# Patient Record
Sex: Female | Born: 1940 | Race: White | Hispanic: No | Smoking: Former smoker
Health system: Southern US, Community
[De-identification: ages and names within clinical notes are randomized; demographics above are authoritative.]

## PROBLEM LIST (undated history)

## (undated) DIAGNOSIS — C801 Malignant (primary) neoplasm, unspecified: Secondary | ICD-10-CM

## (undated) DIAGNOSIS — M1990 Unspecified osteoarthritis, unspecified site: Secondary | ICD-10-CM

## (undated) DIAGNOSIS — R112 Nausea with vomiting, unspecified: Secondary | ICD-10-CM

## (undated) DIAGNOSIS — E119 Type 2 diabetes mellitus without complications: Secondary | ICD-10-CM

## (undated) DIAGNOSIS — Z9889 Other specified postprocedural states: Secondary | ICD-10-CM

## (undated) DIAGNOSIS — E785 Hyperlipidemia, unspecified: Secondary | ICD-10-CM

## (undated) HISTORY — PX: FOOT SURGERY: SHX648

## (undated) HISTORY — PX: DILATION AND CURETTAGE OF UTERUS: SHX78

## (undated) HISTORY — PX: FINGER SURGERY: SHX640

## (undated) HISTORY — PX: BREAST SURGERY: SHX581

## (undated) HISTORY — PX: KNEE ARTHROSCOPY: SUR90

## (undated) HISTORY — PX: KNEE ARTHROSCOPY: SHX127

## (undated) HISTORY — PX: APPENDECTOMY: SHX54

## (undated) HISTORY — PX: CATARACT EXTRACTION, BILATERAL: SHX1313

---

## 1999-12-15 ENCOUNTER — Ambulatory Visit (HOSPITAL_BASED_OUTPATIENT_CLINIC_OR_DEPARTMENT_OTHER): Admission: RE | Admit: 1999-12-15 | Discharge: 1999-12-15 | Payer: Self-pay | Admitting: Orthopedic Surgery

## 2000-11-06 ENCOUNTER — Other Ambulatory Visit: Admission: RE | Admit: 2000-11-06 | Discharge: 2000-11-06 | Payer: Self-pay | Admitting: Family Medicine

## 2000-11-30 ENCOUNTER — Ambulatory Visit (HOSPITAL_COMMUNITY): Admission: RE | Admit: 2000-11-30 | Discharge: 2000-11-30 | Payer: Self-pay | Admitting: Gastroenterology

## 2000-12-01 ENCOUNTER — Encounter: Payer: Self-pay | Admitting: Surgery

## 2000-12-01 ENCOUNTER — Ambulatory Visit (HOSPITAL_COMMUNITY): Admission: RE | Admit: 2000-12-01 | Discharge: 2000-12-01 | Payer: Self-pay | Admitting: Surgery

## 2001-01-31 ENCOUNTER — Ambulatory Visit (HOSPITAL_BASED_OUTPATIENT_CLINIC_OR_DEPARTMENT_OTHER): Admission: RE | Admit: 2001-01-31 | Discharge: 2001-02-01 | Payer: Self-pay | Admitting: Plastic Surgery

## 2001-05-14 ENCOUNTER — Encounter: Payer: Self-pay | Admitting: Plastic Surgery

## 2001-05-18 ENCOUNTER — Inpatient Hospital Stay (HOSPITAL_COMMUNITY): Admission: RE | Admit: 2001-05-18 | Discharge: 2001-05-20 | Payer: Self-pay | Admitting: Plastic Surgery

## 2001-07-17 ENCOUNTER — Ambulatory Visit (HOSPITAL_BASED_OUTPATIENT_CLINIC_OR_DEPARTMENT_OTHER): Admission: RE | Admit: 2001-07-17 | Discharge: 2001-07-17 | Payer: Self-pay | Admitting: Plastic Surgery

## 2014-06-25 NOTE — H&P (Signed)
TOTAL KNEE ADMISSION H&P  Patient is being admitted for right total knee arthroplasty.  Subjective:  Chief Complaint:   Right knee primary OA / pain.  HPI: Brenda Pham, 74 y.o. female, has a history of pain and functional disability in the right knee due to arthritis and has failed non-surgical conservative treatments for greater than 12 weeks to include NSAID's and/or analgesics, corticosteriod injections and activity modification.  Onset of symptoms was gradual, starting 3+ years ago with gradually worsening course since that time. The patient noted prior procedures on the knee to include  arthroscopy on the right knee(s).  Patient currently rates pain in the right knee(s) at 10 out of 10 with activity. Patient has night pain, worsening of pain with activity and weight bearing, pain that interferes with activities of daily living, pain with passive range of motion, crepitus and joint swelling.  Patient has evidence of periarticular osteophytes and joint space narrowing by imaging studies.  There is no active infection.  Risks, benefits and expectations were discussed with the patient.  Risks including but not limited to the risk of anesthesia, blood clots, nerve damage, blood vessel damage, failure of the prosthesis, infection and up to and including death.  Patient understand the risks, benefits and expectations and wishes to proceed with surgery.    PCP: Leonard Downing, MD  D/C Plans:      SNF  Post-op Meds:       No Rx given   Tranexamic Acid:      To be given - IV  Decadron:      Is to be given  FYI:     ASA post-op  Norco post-op     Past Medical History  Diagnosis Date  . PONV (postoperative nausea and vomiting)   . Hyperlipidemia   . Diabetes mellitus without complication     recently dx. no oral meds or insulin.  . Arthritis     osteoarthritis -knees, hands  . Cancer     '80-right breast, surgery and radiation.    Past Surgical History  Procedure  Laterality Date  . Cataract extraction, bilateral      last 06-11-14 left  . Appendectomy    . Dilation and curettage of uterus    . Knee arthroscopy Right   . Knee arthroscopy Left     scope with hammer toe, '06- scope for torn cartilage  . Finger surgery Right     right ring finger-excsion cyst and nodules.  . Breast surgery      Rt. breast modified mastectomy/(reconstuction)-last '03 tranflap(tissue from abdomen); and left subcutaneoues mastectomy and implant left breast-last implant '02.  . Foot surgery Left     '10- 2nd toe     No prescriptions prior to admission   Allergies  Allergen Reactions  . Niacin And Related     Extreme flushing, heart racing  . Other     "Clindamycin"-Antibiotic caused thrush.     History  Substance Use Topics  . Smoking status: Former Smoker -- 1.00 packs/day    Types: Cigarettes    Quit date: 07/02/1978  . Smokeless tobacco: Not on file  . Alcohol Use: No       Review of Systems  Constitutional: Negative.   HENT: Negative.   Eyes: Negative.   Respiratory: Negative.   Cardiovascular: Negative.   Gastrointestinal: Negative.   Genitourinary: Negative.   Musculoskeletal: Positive for joint pain.  Skin: Negative.   Neurological: Negative.   Endo/Heme/Allergies: Negative.   Psychiatric/Behavioral:  Negative.     Objective:  Physical Exam  Constitutional: She is oriented to person, place, and time. She appears well-developed and well-nourished.  HENT:  Head: Normocephalic and atraumatic.  Eyes: Pupils are equal, round, and reactive to light.  Neck: Neck supple. No JVD present. No tracheal deviation present. No thyromegaly present.  Cardiovascular: Normal rate, regular rhythm, normal heart sounds and intact distal pulses.   Respiratory: Effort normal and breath sounds normal. No stridor. No respiratory distress. She has no wheezes.  GI: Soft. There is no tenderness. There is no guarding.  Musculoskeletal:       Right knee: She  exhibits decreased range of motion, swelling and bony tenderness. She exhibits no ecchymosis, no deformity, no laceration and no erythema. Tenderness found.  Lymphadenopathy:    She has no cervical adenopathy.  Neurological: She is alert and oriented to person, place, and time.  Skin: Skin is warm and dry.  Psychiatric: She has a normal mood and affect.      Vital signs in last 24 hours: Temp:  [97.4 F (36.3 C)] 97.4 F (36.3 C) (03/02 1103) Pulse Rate:  [86] 86 (03/02 1103) Resp:  [16] 16 (03/02 1103) BP: (127)/(74) 127/74 mmHg (03/02 1103) SpO2:  [98 %] 98 % (03/02 1103) Weight:  [71.782 kg (158 lb 4 oz)] 71.782 kg (158 lb 4 oz) (03/02 1103)    Imaging Review Plain radiographs demonstrate severe degenerative joint disease of the right knee(s). The overall alignment is neutral. The bone quality appears to be good for age and reported activity level.  Assessment/Plan:  End stage arthritis, right knee   The patient history, physical examination, clinical judgment of the provider and imaging studies are consistent with end stage degenerative joint disease of the right knee(s) and total knee arthroplasty is deemed medically necessary. The treatment options including medical management, injection therapy arthroscopy and arthroplasty were discussed at length. The risks and benefits of total knee arthroplasty were presented and reviewed. The risks due to aseptic loosening, infection, stiffness, patella tracking problems, thromboembolic complications and other imponderables were discussed. The patient acknowledged the explanation, agreed to proceed with the plan and consent was signed. Patient is being admitted for inpatient treatment for surgery, pain control, PT, OT, prophylactic antibiotics, VTE prophylaxis, progressive ambulation and ADL's and discharge planning. The patient is planning to be discharged to skilled nursing facility.      West Pugh Tremain Rucinski   PA-C  07/02/2014, 12:52 PM

## 2014-06-27 ENCOUNTER — Inpatient Hospital Stay (HOSPITAL_COMMUNITY): Admission: RE | Admit: 2014-06-27 | Payer: Self-pay | Source: Ambulatory Visit

## 2014-07-01 NOTE — Patient Instructions (Addendum)
49 Brenda Pham  07/01/2014   Your procedure is scheduled on:   3-7--2016 Monday  Enter through [facility]  Entrance and follow signs to [facility]. Arrive at      0630  AM.  Call this number if you have problems the morning of surgery: [phone]  Or Presurgical Testing [phone].   For Living Will and/or Health Care Power Attorney Forms: please provide copy for your medical record,may bring AM of surgery(Forms should be already notarized -we do not provide this service).(07-02-14 Yes/ document scanned  Today into medical record).      Do not eat food/ or drink: After Midnight.      Take these medicines the morning of surgery with A SIP OF WATER: no oral meds- Use/bring eye drops.   Do not wear jewelry, make-up or nail polish.  Do not wear deodorant, lotions, powders, or perfumes.   Do not shave legs and under arms- 48 hours(2 days) prior to first CHG shower.(Shaving face and neck okay.)  Do not bring valuables to the hospital.(Hospital is not responsible for lost valuables).  Contacts, dentures or removable bridgework, body piercing, hair pins may not be worn into surgery.  Leave suitcase in the car. After surgery it may be brought to your room.  For patients admitted to the hospital, checkout time is 11:00 AM the day of discharge.(Restricted visitors-Any Persons displaying flu-like symptoms or illness).    Patients discharged the day of surgery will not be allowed to drive home. Must have responsible person with you x 24 hours once discharged.  Name and phone number of your driver: daughter- Brenda Pham -[phone] cell     Please read over the following fact sheets that you were given:  CHG(Chlorhexidine Gluconate 4% Surgical Soap) use, MRSA Information, Blood Transfusion fact sheet, Incentive Spirometry Instruction.  Remember : Type/Screen "Blue armbands" - may not be removed once applied(would result in being retested AM of surgery, if removed).          [facility] - Preparing for Surgery Before surgery, you can play an important role.  Because skin is not sterile, your skin needs to be as free of germs as possible.  You can reduce the number of germs on your skin by washing with CHG (chlorahexidine gluconate) soap before surgery.  CHG is an antiseptic cleaner which kills germs and bonds with the skin to continue killing germs even after washing. Please DO NOT use if you have an allergy to CHG or antibacterial soaps.  If your skin becomes reddened/irritated stop using the CHG and inform your nurse when you arrive at Short Stay. Do not shave (including legs and underarms) for at least 48 hours prior to the first CHG shower.  You may shave your face/neck. Please follow these instructions carefully:  1.  Shower with CHG Soap the night before surgery and the  morning of Surgery.  2.  If you choose to wash your hair, wash your hair first as usual with your  normal  shampoo.  3.  After you shampoo, rinse your hair and body thoroughly to remove the  shampoo.                           4.  Use CHG as you would any other liquid soap.  You can apply chg directly  to the skin and wash  Gently with a scrungie or clean washcloth.  5.  Apply the CHG Soap to your body ONLY FROM THE NECK DOWN.   Do not use on face/ open                           Wound or open sores. Avoid contact with eyes, ears mouth and genitals (private parts).                       Wash face,  Genitals (private parts) with your normal soap.             6.  Wash thoroughly, paying special attention to the area where your surgery  will be performed.  7.  Thoroughly rinse your body with warm water from the neck down.  8.  DO NOT shower/wash with your normal soap after using and rinsing off  the CHG Soap.                9.  Pat yourself dry with a clean towel.            10.  Wear clean pajamas.            11.  Place clean sheets on your bed the night of your first shower and  do not  sleep with pets. Day of Surgery : Do not apply any lotions/deodorants the morning of surgery.  Please wear clean clothes to the hospital/surgery center.  FAILURE TO FOLLOW THESE INSTRUCTIONS MAY RESULT IN THE CANCELLATION OF YOUR SURGERY PATIENT SIGNATURE_________________________________  NURSE SIGNATURE__________________________________  ________________________________________________________________________   Brenda Pham  An incentive spirometer is a tool that can help keep your lungs clear and active. This tool measures how well you are filling your lungs with each breath. Taking long deep breaths may help reverse or decrease the chance of developing breathing (pulmonary) problems (especially infection) following:  A long period of time when you are unable to move or be active. BEFORE THE PROCEDURE   If the spirometer includes an indicator to show your best effort, your nurse or respiratory therapist will set it to a desired goal.  If possible, sit up straight or lean slightly forward. Try not to slouch.  Hold the incentive spirometer in an upright position. INSTRUCTIONS FOR USE   Sit on the edge of your bed if possible, or sit up as far as you can in bed or on a chair.  Hold the incentive spirometer in an upright position.  Breathe out normally.  Place the mouthpiece in your mouth and seal your lips tightly around it.  Breathe in slowly and as deeply as possible, raising the piston or the ball toward the top of the column.  Hold your breath for 3-5 seconds or for as long as possible. Allow the piston or ball to fall to the bottom of the column.  Remove the mouthpiece from your mouth and breathe out normally.  Rest for a few seconds and repeat Steps 1 through 7 at least 10 times every 1-2 hours when you are awake. Take your time and take a few normal breaths between deep breaths.  The spirometer may include an indicator to show your best effort. Use the  indicator as a goal to work toward during each repetition.  After each set of 10 deep breaths, practice coughing to be sure your lungs are clear. If you have an incision (the cut made at the time of  surgery), support your incision when coughing by placing a pillow or rolled up towels firmly against it. Once you are able to get out of bed, walk around indoors and cough well. You may stop using the incentive spirometer when instructed by your caregiver.  RISKS AND COMPLICATIONS  Take your time so you do not get dizzy or light-headed.  If you are in pain, you may need to take or ask for pain medication before doing incentive spirometry. It is harder to take a deep breath if you are having pain. AFTER USE  Rest and breathe slowly and easily.  It can be helpful to keep track of a log of your progress. Your caregiver can provide you with a simple table to help with this. If you are using the spirometer at home, follow these instructions: [place] IF:   You are having difficultly using the spirometer.  You have trouble using the spirometer as often as instructed.  Your pain medication is not giving enough relief while using the spirometer.  You develop fever of 100.5 F (38.1 C) or higher. SEEK IMMEDIATE MEDICAL CARE IF:   You cough up bloody sputum that had not been present before.  You develop fever of 102 F (38.9 C) or greater.  You develop worsening pain at or near the incision site. MAKE SURE YOU:   Understand these instructions.  Will watch your condition.  Will get help right away if you are not doing well or get worse. Document Released: 08/29/2006 Document Revised: 07/11/2011 Document Reviewed: 10/30/2006 Exit[employer] Patient Information 2014 [employer], [place].   ________________________________________________________________________  WHAT IS A BLOOD TRANSFUSION? Blood Transfusion Information  A transfusion is the replacement of blood or some of its parts. Blood  is made up of multiple cells which provide different functions.  Red blood cells carry oxygen and are used for blood loss replacement.  White blood cells fight against infection.  Platelets control bleeding.  Plasma helps clot blood.  Other blood products are available for specialized needs, such as hemophilia or other clotting disorders. BEFORE THE TRANSFUSION  Who gives blood for transfusions?   Healthy volunteers who are fully evaluated to make sure their blood is safe. This is blood bank blood. Transfusion therapy is the safest it has ever been in the practice of medicine. Before blood is taken from a donor, a complete history is taken to make sure that person has no history of diseases nor engages in risky social behavior (examples are intravenous drug use or sexual activity with multiple partners). The donor's travel history is screened to minimize risk of transmitting infections, such as malaria. The donated blood is tested for signs of infectious diseases, such as HIV and hepatitis. The blood is then tested to be sure it is compatible with you in order to minimize the chance of a transfusion reaction. If you or a relative donates blood, this is often done in anticipation of surgery and is not appropriate for emergency situations. It takes many days to process the donated blood. RISKS AND COMPLICATIONS Although transfusion therapy is very safe and saves many lives, the main dangers of transfusion include:   Getting an infectious disease.  Developing a transfusion reaction. This is an allergic reaction to something in the blood you were given. Every precaution is taken to prevent this. The decision to have a blood transfusion has been considered carefully by your caregiver before blood is given. Blood is not given unless the benefits outweigh the risks. AFTER THE TRANSFUSION  Right after receiving a blood transfusion, you will usually feel much better and more energetic. This is  especially true if your red blood cells have gotten low (anemic). The transfusion raises the level of the red blood cells which carry oxygen, and this usually causes an energy increase.  The nurse administering the transfusion will monitor you carefully for complications. HOME CARE INSTRUCTIONS  No special instructions are needed after a transfusion. You may find your energy is better. Speak with your caregiver about any limitations on activity for underlying diseases you may have. SEEK MEDICAL CARE IF:   Your condition is not improving after your transfusion.  You develop redness or irritation at the intravenous (IV) site. SEEK IMMEDIATE MEDICAL CARE IF:  Any of the following symptoms occur over the next 12 hours:  Shaking chills.  You have a temperature by mouth above 102 F (38.9 C), not controlled by medicine.  Chest, back, or muscle pain.  People around you feel you are not acting correctly or are confused.  Shortness of breath or difficulty breathing.  Dizziness and fainting.  You get a rash or develop hives.  You have a decrease in urine output.  Your urine turns a dark color or changes to pink, red, or brown. Any of the following symptoms occur over the next 10 days:  You have a temperature by mouth above 102 F (38.9 C), not controlled by medicine.  Shortness of breath.  Weakness after normal activity.  The white part of the eye turns yellow (jaundice).  You have a decrease in the amount of urine or are urinating less often.  Your urine turns a dark color or changes to pink, red, or brown. Document Released: 04/15/2000 Document Revised: 07/11/2011 Document Reviewed: 12/03/2007 [facility] Patient Information 2014 [place], [place].  _______________________________________________________________________

## 2014-07-02 ENCOUNTER — Encounter (HOSPITAL_COMMUNITY): Payer: Self-pay

## 2014-07-02 ENCOUNTER — Encounter (HOSPITAL_COMMUNITY)
Admission: RE | Admit: 2014-07-02 | Discharge: 2014-07-02 | Disposition: A | Discharge status: Home / Self Care | Payer: Medicare Other | Source: Ambulatory Visit | Attending: Orthopedic Surgery | Admitting: Orthopedic Surgery

## 2014-07-02 DIAGNOSIS — Z01818 Encounter for other preprocedural examination: Secondary | ICD-10-CM | POA: Insufficient documentation

## 2014-07-02 DIAGNOSIS — Z01812 Encounter for preprocedural laboratory examination: Secondary | ICD-10-CM | POA: Diagnosis not present

## 2014-07-02 DIAGNOSIS — Z87891 Personal history of nicotine dependence: Secondary | ICD-10-CM | POA: Insufficient documentation

## 2014-07-02 DIAGNOSIS — M179 Osteoarthritis of knee, unspecified: Secondary | ICD-10-CM | POA: Diagnosis not present

## 2014-07-02 DIAGNOSIS — Z0183 Encounter for blood typing: Secondary | ICD-10-CM | POA: Insufficient documentation

## 2014-07-02 DIAGNOSIS — E119 Type 2 diabetes mellitus without complications: Secondary | ICD-10-CM | POA: Diagnosis not present

## 2014-07-02 DIAGNOSIS — E785 Hyperlipidemia, unspecified: Secondary | ICD-10-CM | POA: Diagnosis not present

## 2014-07-02 HISTORY — DX: Hyperlipidemia, unspecified: E78.5

## 2014-07-02 HISTORY — DX: Type 2 diabetes mellitus without complications: E11.9

## 2014-07-02 HISTORY — DX: Unspecified osteoarthritis, unspecified site: M19.90

## 2014-07-02 HISTORY — DX: Malignant (primary) neoplasm, unspecified: C80.1

## 2014-07-02 HISTORY — DX: Other specified postprocedural states: Z98.890

## 2014-07-02 HISTORY — DX: Other specified postprocedural states: R11.2

## 2014-07-02 LAB — CBC
HCT: 41.2 % (ref 36.0–46.0)
Hemoglobin: 13.6 g/dL (ref 12.0–15.0)
MCH: 28.5 pg (ref 26.0–34.0)
MCHC: 33 g/dL (ref 30.0–36.0)
MCV: 86.2 fL (ref 78.0–100.0)
Platelets: 169 10*3/uL (ref 150–400)
RBC: 4.78 MIL/uL (ref 3.87–5.11)
RDW: 13.4 % (ref 11.5–15.5)
WBC: 3.8 10*3/uL — ABNORMAL LOW (ref 4.0–10.5)

## 2014-07-02 LAB — URINALYSIS, ROUTINE W REFLEX MICROSCOPIC
Bilirubin Urine: NEGATIVE
Glucose, UA: NEGATIVE mg/dL
Hgb urine dipstick: NEGATIVE
Ketones, ur: NEGATIVE mg/dL
Leukocytes, UA: NEGATIVE
Nitrite: NEGATIVE
Protein, ur: NEGATIVE mg/dL
Specific Gravity, Urine: 1.02 (ref 1.005–1.030)
Urobilinogen, UA: 0.2 mg/dL (ref 0.0–1.0)
pH: 5 (ref 5.0–8.0)

## 2014-07-02 LAB — BASIC METABOLIC PANEL
Anion gap: 8 (ref 5–15)
BUN: 29 mg/dL — ABNORMAL HIGH (ref 6–23)
CO2: 26 mmol/L (ref 19–32)
Calcium: 10.5 mg/dL (ref 8.4–10.5)
Chloride: 106 mmol/L (ref 96–112)
Creatinine, Ser: 0.87 mg/dL (ref 0.50–1.10)
GFR calc Af Amer: 74 mL/min — ABNORMAL LOW (ref >90)
GFR calc non Af Amer: 64 mL/min — ABNORMAL LOW (ref >90)
Glucose, Bld: 130 mg/dL — ABNORMAL HIGH (ref 70–99)
Potassium: 4.3 mmol/L (ref 3.5–5.1)
Sodium: 140 mmol/L (ref 135–145)

## 2014-07-02 LAB — APTT: aPTT: 31 seconds (ref 24–37)

## 2014-07-02 LAB — PROTIME-INR
INR: 1.15 (ref 0.00–1.49)
Prothrombin Time: 14.8 seconds (ref 11.6–15.2)

## 2014-07-02 LAB — ABO/RH: ABO/RH(D): O POS

## 2014-07-02 LAB — SURGICAL PCR SCREEN
MRSA, PCR: NEGATIVE
Staphylococcus aureus: POSITIVE — AB

## 2014-07-02 NOTE — Pre-Procedure Instructions (Signed)
07-02-14 EKG/ CXR 06-09-14 reports with chart. Clearance note (Dr. Welton Flakes) 05-09-14 with chart.

## 2014-07-02 NOTE — Progress Notes (Signed)
07-02-14 1415 Pt made aware of Positive Staph aureus PCR- Rx for Mupirocin ointment called to [place]. Faxed note sent to Dr. Alvan Dame [phone]. Please note labs viewable in Epic-note BMP

## 2014-07-07 ENCOUNTER — Inpatient Hospital Stay (HOSPITAL_COMMUNITY): Payer: Medicare Other | Admitting: Registered Nurse

## 2014-07-07 ENCOUNTER — Encounter (HOSPITAL_COMMUNITY): Payer: Self-pay | Admitting: *Deleted

## 2014-07-07 ENCOUNTER — Encounter (HOSPITAL_COMMUNITY)
Admission: RE | Disposition: A | Discharge status: Home / Self Care | Payer: Self-pay | Source: Ambulatory Visit | Attending: Orthopedic Surgery

## 2014-07-07 ENCOUNTER — Inpatient Hospital Stay (HOSPITAL_COMMUNITY)
Admission: RE | Admit: 2014-07-07 | Discharge: 2014-07-09 | DRG: 470 | Disposition: A | Discharge status: Home / Self Care | Payer: Medicare Other | Source: Ambulatory Visit | Attending: Orthopedic Surgery | Admitting: Orthopedic Surgery

## 2014-07-07 DIAGNOSIS — Z9841 Cataract extraction status, right eye: Secondary | ICD-10-CM | POA: Diagnosis not present

## 2014-07-07 DIAGNOSIS — Z888 Allergy status to other drugs, medicaments and biological substances status: Secondary | ICD-10-CM

## 2014-07-07 DIAGNOSIS — Z6825 Body mass index (BMI) 25.0-25.9, adult: Secondary | ICD-10-CM

## 2014-07-07 DIAGNOSIS — M1711 Unilateral primary osteoarthritis, right knee: Secondary | ICD-10-CM | POA: Diagnosis present

## 2014-07-07 DIAGNOSIS — Z87891 Personal history of nicotine dependence: Secondary | ICD-10-CM

## 2014-07-07 DIAGNOSIS — E119 Type 2 diabetes mellitus without complications: Secondary | ICD-10-CM | POA: Diagnosis present

## 2014-07-07 DIAGNOSIS — E663 Overweight: Secondary | ICD-10-CM | POA: Diagnosis present

## 2014-07-07 DIAGNOSIS — Z881 Allergy status to other antibiotic agents status: Secondary | ICD-10-CM | POA: Diagnosis not present

## 2014-07-07 DIAGNOSIS — Z9011 Acquired absence of right breast and nipple: Secondary | ICD-10-CM | POA: Diagnosis present

## 2014-07-07 DIAGNOSIS — M65861 Other synovitis and tenosynovitis, right lower leg: Secondary | ICD-10-CM | POA: Diagnosis present

## 2014-07-07 DIAGNOSIS — Z853 Personal history of malignant neoplasm of breast: Secondary | ICD-10-CM | POA: Diagnosis not present

## 2014-07-07 DIAGNOSIS — M25561 Pain in right knee: Secondary | ICD-10-CM | POA: Diagnosis present

## 2014-07-07 DIAGNOSIS — E785 Hyperlipidemia, unspecified: Secondary | ICD-10-CM | POA: Diagnosis present

## 2014-07-07 DIAGNOSIS — Z9842 Cataract extraction status, left eye: Secondary | ICD-10-CM

## 2014-07-07 DIAGNOSIS — Z96651 Presence of right artificial knee joint: Secondary | ICD-10-CM

## 2014-07-07 DIAGNOSIS — Z96659 Presence of unspecified artificial knee joint: Secondary | ICD-10-CM

## 2014-07-07 HISTORY — PX: TOTAL KNEE ARTHROPLASTY: SHX125

## 2014-07-07 LAB — TYPE AND SCREEN
ABO/RH(D): O POS
Antibody Screen: NEGATIVE

## 2014-07-07 LAB — GLUCOSE, CAPILLARY: Glucose-Capillary: 99 mg/dL (ref 70–99)

## 2014-07-07 SURGERY — ARTHROPLASTY, KNEE, TOTAL
Anesthesia: Monitor Anesthesia Care | Site: Knee | Laterality: Right

## 2014-07-07 MED ORDER — BUPIVACAINE-EPINEPHRINE (PF) 0.25% -1:200000 IJ SOLN
INTRAMUSCULAR | Status: AC
  Filled 2014-07-07: qty 30

## 2014-07-07 MED ORDER — KETOROLAC TROMETHAMINE 0.5 % OP SOLN
1.0000 [drp] | Freq: Every day | OPHTHALMIC | Status: DC
  Filled 2014-07-07: qty 3

## 2014-07-07 MED ORDER — DEXAMETHASONE SODIUM PHOSPHATE 10 MG/ML IJ SOLN
INTRAMUSCULAR | Status: AC
  Filled 2014-07-07: qty 1

## 2014-07-07 MED ORDER — HYDROMORPHONE HCL 1 MG/ML IJ SOLN
0.5000 mg | INTRAMUSCULAR | Status: DC | PRN
  Administered 2014-07-08: 0.5 mg via INTRAVENOUS
  Filled 2014-07-07: qty 1

## 2014-07-07 MED ORDER — ARTIFICIAL TEARS OP OINT
TOPICAL_OINTMENT | Freq: Every day | OPHTHALMIC | Status: DC
  Administered 2014-07-08: 22:00:00 via OPHTHALMIC
  Filled 2014-07-07: qty 3.5

## 2014-07-07 MED ORDER — ALUM & MAG HYDROXIDE-SIMETH 200-200-20 MG/5ML PO SUSP: 30.0000 mL | ORAL | Status: DC | PRN

## 2014-07-07 MED ORDER — DIPHENHYDRAMINE HCL 25 MG PO CAPS: 25.0000 mg | ORAL_CAPSULE | Freq: Four times a day (QID) | ORAL | Status: DC | PRN

## 2014-07-07 MED ORDER — POLYETHYL GLYCOL-PROPYL GLYCOL 0.4-0.3 % OP GEL: 1.0000 "application " | Freq: Every day | OPHTHALMIC | Status: DC

## 2014-07-07 MED ORDER — ASPIRIN EC 325 MG PO TBEC
325.0000 mg | DELAYED_RELEASE_TABLET | Freq: Two times a day (BID) | ORAL | Status: DC
  Administered 2014-07-08 – 2014-07-09 (×3): 325 mg via ORAL
  Filled 2014-07-07 (×5): qty 1

## 2014-07-07 MED ORDER — PROMETHAZINE HCL 25 MG/ML IJ SOLN: 6.2500 mg | INTRAMUSCULAR | Status: DC | PRN

## 2014-07-07 MED ORDER — PHENOL 1.4 % MT LIQD: 1.0000 | OROMUCOSAL | Status: DC | PRN

## 2014-07-07 MED ORDER — MAGNESIUM CITRATE PO SOLN: 1.0000 | Freq: Once | ORAL | Status: AC | PRN

## 2014-07-07 MED ORDER — PROPOFOL 10 MG/ML IV BOLUS
INTRAVENOUS | Status: AC
  Filled 2014-07-07: qty 20

## 2014-07-07 MED ORDER — METOCLOPRAMIDE HCL 5 MG/ML IJ SOLN
5.0000 mg | Freq: Three times a day (TID) | INTRAMUSCULAR | Status: DC | PRN
  Filled 2014-07-07: qty 2

## 2014-07-07 MED ORDER — SODIUM CHLORIDE 0.9 % IJ SOLN
INTRAMUSCULAR | Status: AC
  Filled 2014-07-07: qty 50

## 2014-07-07 MED ORDER — METHOCARBAMOL 500 MG PO TABS
500.0000 mg | ORAL_TABLET | Freq: Four times a day (QID) | ORAL | Status: DC | PRN
  Administered 2014-07-08 – 2014-07-09 (×2): 500 mg via ORAL
  Filled 2014-07-07 (×2): qty 1

## 2014-07-07 MED ORDER — SODIUM CHLORIDE 0.9 % IR SOLN
Status: DC | PRN
  Administered 2014-07-07: 1000 mL

## 2014-07-07 MED ORDER — CEFAZOLIN SODIUM-DEXTROSE 2-3 GM-% IV SOLR
2.0000 g | INTRAVENOUS | Status: AC
  Administered 2014-07-07: 2 g via INTRAVENOUS

## 2014-07-07 MED ORDER — BUPIVACAINE IN DEXTROSE 0.75-8.25 % IT SOLN
INTRATHECAL | Status: DC | PRN
  Administered 2014-07-07: 1.8 mL via INTRATHECAL

## 2014-07-07 MED ORDER — CHLORHEXIDINE GLUCONATE 4 % EX LIQD: 60.0000 mL | Freq: Once | CUTANEOUS | Status: DC

## 2014-07-07 MED ORDER — ATORVASTATIN CALCIUM 20 MG PO TABS
20.0000 mg | ORAL_TABLET | Freq: Every day | ORAL | Status: DC
  Administered 2014-07-07 – 2014-07-08 (×2): 20 mg via ORAL
  Filled 2014-07-07 (×3): qty 1

## 2014-07-07 MED ORDER — METOCLOPRAMIDE HCL 10 MG PO TABS: 5.0000 mg | ORAL_TABLET | Freq: Three times a day (TID) | ORAL | Status: DC | PRN

## 2014-07-07 MED ORDER — FENTANYL CITRATE 0.05 MG/ML IJ SOLN
INTRAMUSCULAR | Status: AC
  Filled 2014-07-07: qty 2

## 2014-07-07 MED ORDER — ONDANSETRON HCL 4 MG/2ML IJ SOLN
INTRAMUSCULAR | Status: DC | PRN
  Administered 2014-07-07: 4 mg via INTRAVENOUS

## 2014-07-07 MED ORDER — ONDANSETRON HCL 4 MG/2ML IJ SOLN
INTRAMUSCULAR | Status: AC
  Filled 2014-07-07: qty 2

## 2014-07-07 MED ORDER — ONDANSETRON HCL 4 MG PO TABS: 4.0000 mg | ORAL_TABLET | Freq: Four times a day (QID) | ORAL | Status: DC | PRN

## 2014-07-07 MED ORDER — CEFAZOLIN SODIUM-DEXTROSE 2-3 GM-% IV SOLR
INTRAVENOUS | Status: AC
  Filled 2014-07-07: qty 50

## 2014-07-07 MED ORDER — BISACODYL 10 MG RE SUPP: 10.0000 mg | Freq: Every day | RECTAL | Status: DC | PRN

## 2014-07-07 MED ORDER — OXYCODONE HCL 5 MG PO TABS: 5.0000 mg | ORAL_TABLET | Freq: Once | ORAL | Status: DC | PRN

## 2014-07-07 MED ORDER — DEXAMETHASONE SODIUM PHOSPHATE 10 MG/ML IJ SOLN: 10.0000 mg | Freq: Once | INTRAMUSCULAR | Status: DC

## 2014-07-07 MED ORDER — SODIUM CHLORIDE 0.9 % IJ SOLN
INTRAMUSCULAR | Status: DC | PRN
  Administered 2014-07-07: 30 mL via INTRAVENOUS

## 2014-07-07 MED ORDER — LIDOCAINE HCL (CARDIAC) 20 MG/ML IV SOLN
INTRAVENOUS | Status: AC
  Filled 2014-07-07: qty 5

## 2014-07-07 MED ORDER — PROPOFOL INFUSION 10 MG/ML OPTIME
INTRAVENOUS | Status: DC | PRN
  Administered 2014-07-07: 50 ug/kg/min via INTRAVENOUS

## 2014-07-07 MED ORDER — MIDAZOLAM HCL 5 MG/5ML IJ SOLN
INTRAMUSCULAR | Status: DC | PRN
  Administered 2014-07-07 (×2): 1 mg via INTRAVENOUS

## 2014-07-07 MED ORDER — KETOROLAC TROMETHAMINE 30 MG/ML IJ SOLN
INTRAMUSCULAR | Status: DC | PRN
  Administered 2014-07-07: 30 mg via INTRAVENOUS

## 2014-07-07 MED ORDER — BUPIVACAINE-EPINEPHRINE (PF) 0.25% -1:200000 IJ SOLN
INTRAMUSCULAR | Status: DC | PRN
  Administered 2014-07-07: 30 mL

## 2014-07-07 MED ORDER — KETOROLAC TROMETHAMINE 30 MG/ML IJ SOLN
INTRAMUSCULAR | Status: AC
  Filled 2014-07-07: qty 1

## 2014-07-07 MED ORDER — TRANEXAMIC ACID 100 MG/ML IV SOLN
1000.0000 mg | Freq: Once | INTRAVENOUS | Status: AC
  Administered 2014-07-07: 1000 mg via INTRAVENOUS
  Filled 2014-07-07: qty 10

## 2014-07-07 MED ORDER — POLYETHYLENE GLYCOL 3350 17 G PO PACK
17.0000 g | PACK | Freq: Two times a day (BID) | ORAL | Status: DC
  Administered 2014-07-07 – 2014-07-08 (×3): 17 g via ORAL

## 2014-07-07 MED ORDER — DEXTROSE 5 % IV SOLN
500.0000 mg | Freq: Four times a day (QID) | INTRAVENOUS | Status: DC | PRN
  Administered 2014-07-07: 500 mg via INTRAVENOUS
  Filled 2014-07-07 (×2): qty 5

## 2014-07-07 MED ORDER — LACTATED RINGERS IV SOLN
INTRAVENOUS | Status: DC
  Administered 2014-07-07: 1000 mL via INTRAVENOUS

## 2014-07-07 MED ORDER — FERROUS SULFATE 325 (65 FE) MG PO TABS
325.0000 mg | ORAL_TABLET | Freq: Three times a day (TID) | ORAL | Status: DC
  Administered 2014-07-08 (×3): 325 mg via ORAL
  Filled 2014-07-07 (×8): qty 1

## 2014-07-07 MED ORDER — HYDROMORPHONE HCL 1 MG/ML IJ SOLN
INTRAMUSCULAR | Status: AC
  Filled 2014-07-07: qty 1

## 2014-07-07 MED ORDER — CEFAZOLIN SODIUM-DEXTROSE 2-3 GM-% IV SOLR
2.0000 g | Freq: Four times a day (QID) | INTRAVENOUS | Status: AC
  Administered 2014-07-07 (×2): 2 g via INTRAVENOUS
  Filled 2014-07-07 (×2): qty 50

## 2014-07-07 MED ORDER — FENTANYL CITRATE 0.05 MG/ML IJ SOLN
INTRAMUSCULAR | Status: DC | PRN
  Administered 2014-07-07: 25 ug via INTRAVENOUS

## 2014-07-07 MED ORDER — ONDANSETRON HCL 4 MG/2ML IJ SOLN
4.0000 mg | Freq: Four times a day (QID) | INTRAMUSCULAR | Status: DC | PRN
  Administered 2014-07-09: 4 mg via INTRAVENOUS
  Filled 2014-07-07 (×2): qty 2

## 2014-07-07 MED ORDER — CELECOXIB 200 MG PO CAPS
200.0000 mg | ORAL_CAPSULE | Freq: Two times a day (BID) | ORAL | Status: DC
  Administered 2014-07-07 – 2014-07-08 (×3): 200 mg via ORAL
  Filled 2014-07-07 (×5): qty 1

## 2014-07-07 MED ORDER — DEXAMETHASONE SODIUM PHOSPHATE 10 MG/ML IJ SOLN
10.0000 mg | Freq: Once | INTRAMUSCULAR | Status: AC
  Administered 2014-07-07: 10 mg via INTRAVENOUS

## 2014-07-07 MED ORDER — HYDROMORPHONE HCL 1 MG/ML IJ SOLN
0.2500 mg | INTRAMUSCULAR | Status: DC | PRN
  Administered 2014-07-07: 0.5 mg via INTRAVENOUS

## 2014-07-07 MED ORDER — DOCUSATE SODIUM 100 MG PO CAPS
100.0000 mg | ORAL_CAPSULE | Freq: Two times a day (BID) | ORAL | Status: DC
  Administered 2014-07-07 – 2014-07-08 (×3): 100 mg via ORAL

## 2014-07-07 MED ORDER — OFLOXACIN 0.3 % OP SOLN
1.0000 [drp] | Freq: Two times a day (BID) | OPHTHALMIC | Status: DC
  Filled 2014-07-07: qty 5

## 2014-07-07 MED ORDER — HYDROCODONE-ACETAMINOPHEN 7.5-325 MG PO TABS
1.0000 | ORAL_TABLET | ORAL | Status: DC
  Administered 2014-07-07: 1 via ORAL
  Administered 2014-07-07: 2 via ORAL
  Administered 2014-07-08 (×2): 1 via ORAL
  Administered 2014-07-08: 2 via ORAL
  Administered 2014-07-08 – 2014-07-09 (×2): 1 via ORAL
  Administered 2014-07-09 (×2): 2 via ORAL
  Filled 2014-07-07: qty 1
  Filled 2014-07-07 (×2): qty 2
  Filled 2014-07-07 (×3): qty 1
  Filled 2014-07-07 (×3): qty 2
  Filled 2014-07-07 (×2): qty 1
  Filled 2014-07-07: qty 2

## 2014-07-07 MED ORDER — OXYCODONE HCL 5 MG/5ML PO SOLN
5.0000 mg | Freq: Once | ORAL | Status: DC | PRN
  Filled 2014-07-07: qty 5

## 2014-07-07 MED ORDER — SODIUM CHLORIDE 0.9 % IV SOLN
INTRAVENOUS | Status: DC
  Administered 2014-07-07: 16:00:00 via INTRAVENOUS
  Filled 2014-07-07 (×11): qty 1000

## 2014-07-07 MED ORDER — 0.9 % SODIUM CHLORIDE (POUR BTL) OPTIME
TOPICAL | Status: DC | PRN
  Administered 2014-07-07: 1000 mL

## 2014-07-07 MED ORDER — MIDAZOLAM HCL 2 MG/2ML IJ SOLN
INTRAMUSCULAR | Status: AC
  Filled 2014-07-07: qty 2

## 2014-07-07 MED ORDER — MENTHOL 3 MG MT LOZG: 1.0000 | LOZENGE | OROMUCOSAL | Status: DC | PRN

## 2014-07-07 SURGICAL SUPPLY — 59 items
ADH SKN CLS APL DERMABOND .7 (GAUZE/BANDAGES/DRESSINGS) ×1
BAG DECANTER FOR FLEXI CONT (MISCELLANEOUS) IMPLANT
BAG SPEC THK2 15X12 ZIP CLS (MISCELLANEOUS)
BAG ZIPLOCK 12X15 (MISCELLANEOUS) IMPLANT
BANDAGE ELASTIC 6 VELCRO ST LF (GAUZE/BANDAGES/DRESSINGS) ×2 IMPLANT
BANDAGE ESMARK 6X9 LF (GAUZE/BANDAGES/DRESSINGS) ×1 IMPLANT
BLADE SAW SGTL 13.0X1.19X90.0M (BLADE) ×2 IMPLANT
BNDG CMPR 9X6 STRL LF SNTH (GAUZE/BANDAGES/DRESSINGS) ×1
BNDG ESMARK 6X9 LF (GAUZE/BANDAGES/DRESSINGS) ×2
BONE CEMENT GENTAMICIN (Cement) ×4 IMPLANT
BOWL SMART MIX CTS (DISPOSABLE) ×2 IMPLANT
CAP KNEE TOTAL 3 SIGMA ×1 IMPLANT
CEMENT BONE GENTAMICIN 40 (Cement) IMPLANT
CUFF TOURN SGL QUICK 34 (TOURNIQUET CUFF) ×2
CUFF TRNQT CYL 34X4X40X1 (TOURNIQUET CUFF) ×1 IMPLANT
DECANTER SPIKE VIAL GLASS SM (MISCELLANEOUS) ×2 IMPLANT
DERMABOND ADVANCED (GAUZE/BANDAGES/DRESSINGS) ×1
DERMABOND ADVANCED .7 DNX12 (GAUZE/BANDAGES/DRESSINGS) ×1 IMPLANT
DRAPE EXTREMITY T 121X128X90 (DRAPE) ×2 IMPLANT
DRAPE POUCH INSTRU U-SHP 10X18 (DRAPES) ×2 IMPLANT
DRAPE U-SHAPE 47X51 STRL (DRAPES) ×2 IMPLANT
DRSG AQUACEL AG ADV 3.5X10 (GAUZE/BANDAGES/DRESSINGS) ×2 IMPLANT
DURAPREP 26ML APPLICATOR (WOUND CARE) ×4 IMPLANT
ELECT REM PT RETURN 9FT ADLT (ELECTROSURGICAL) ×2
ELECTRODE REM PT RTRN 9FT ADLT (ELECTROSURGICAL) ×1 IMPLANT
FACESHIELD WRAPAROUND (MASK) ×10 IMPLANT
FACESHIELD WRAPAROUND OR TEAM (MASK) ×5 IMPLANT
GLOVE BIOGEL PI IND STRL 7.5 (GLOVE) ×1 IMPLANT
GLOVE BIOGEL PI IND STRL 8.5 (GLOVE) ×1 IMPLANT
GLOVE BIOGEL PI INDICATOR 7.5 (GLOVE) ×1
GLOVE BIOGEL PI INDICATOR 8.5 (GLOVE) ×1
GLOVE ECLIPSE 8.0 STRL XLNG CF (GLOVE) ×2 IMPLANT
GLOVE ORTHO TXT STRL SZ7.5 (GLOVE) ×4 IMPLANT
GOWN SPEC L3 XXLG W/TWL (GOWN DISPOSABLE) ×2 IMPLANT
GOWN STRL REUS W/TWL LRG LVL3 (GOWN DISPOSABLE) ×2 IMPLANT
HANDPIECE INTERPULSE COAX TIP (DISPOSABLE) ×2
KIT BASIN OR (CUSTOM PROCEDURE TRAY) ×2 IMPLANT
LIQUID BAND (GAUZE/BANDAGES/DRESSINGS) ×2 IMPLANT
MANIFOLD NEPTUNE II (INSTRUMENTS) ×2 IMPLANT
NDL SAFETY ECLIPSE 18X1.5 (NEEDLE) ×1 IMPLANT
NEEDLE HYPO 18GX1.5 SHARP (NEEDLE) ×2
PACK TOTAL JOINT (CUSTOM PROCEDURE TRAY) ×2 IMPLANT
PEN SKIN MARKING BROAD (MISCELLANEOUS) ×2 IMPLANT
POSITIONER SURGICAL ARM (MISCELLANEOUS) ×2 IMPLANT
SET HNDPC FAN SPRY TIP SCT (DISPOSABLE) ×1 IMPLANT
SET PAD KNEE POSITIONER (MISCELLANEOUS) ×2 IMPLANT
SUCTION FRAZIER 12FR DISP (SUCTIONS) ×2 IMPLANT
SUT MNCRL AB 4-0 PS2 18 (SUTURE) ×2 IMPLANT
SUT VIC AB 1 CT1 36 (SUTURE) ×2 IMPLANT
SUT VIC AB 2-0 CT1 27 (SUTURE) ×6
SUT VIC AB 2-0 CT1 TAPERPNT 27 (SUTURE) ×3 IMPLANT
SUT VLOC 180 0 24IN GS25 (SUTURE) ×2 IMPLANT
SYR 50ML LL SCALE MARK (SYRINGE) ×2 IMPLANT
TOWEL OR 17X26 10 PK STRL BLUE (TOWEL DISPOSABLE) ×2 IMPLANT
TOWEL OR NON WOVEN STRL DISP B (DISPOSABLE) IMPLANT
TRAY FOLEY CATH 14FRSI W/METER (CATHETERS) ×2 IMPLANT
WATER STERILE IRR 1500ML POUR (IV SOLUTION) ×2 IMPLANT
WRAP KNEE MAXI GEL POST OP (GAUZE/BANDAGES/DRESSINGS) ×2 IMPLANT
YANKAUER SUCT BULB TIP 10FT TU (MISCELLANEOUS) ×2 IMPLANT

## 2014-07-07 NOTE — Interval H&P Note (Signed)
History and Physical Interval Note:  07/07/2014 6:44 AM  Brenda Pham  has presented today for surgery, with the diagnosis of RIGHT KNEE OA  The various methods of treatment have been discussed with the patient and family. After consideration of risks, benefits and other options for treatment, the patient has consented to  Procedure(s): RIGHTYTOTAL KNEE ARTHROPLASTY (Right) as a surgical intervention .  The patient's history has been reviewed, patient examined, no change in status, stable for surgery.  I have reviewed the patient's chart and labs.  Questions were answered to the patient's satisfaction.     [name]

## 2014-07-07 NOTE — Anesthesia Preprocedure Evaluation (Addendum)
Anesthesia Evaluation  Patient identified by MRN, date of birth, ID band Patient awake    Reviewed: Allergy & Precautions, NPO status , Patient's Chart, lab work & pertinent test results  History of Anesthesia Complications (+) PONV  Airway Mallampati: III  TM Distance: >3 FB Neck ROM: Full    Dental  (+) Teeth Intact   Pulmonary former smoker,  breath sounds clear to auscultation        Cardiovascular negative cardio ROS  Rhythm:Regular Rate:Normal     Neuro/Psych negative neurological ROS     GI/Hepatic negative GI ROS, Neg liver ROS,   Endo/Other  diabetes, Well Controlled, Type 2  Renal/GU negative Renal ROS     Musculoskeletal  (+) Arthritis -,   Abdominal   Peds  Hematology negative hematology ROS (+)   Anesthesia Other Findings   Reproductive/Obstetrics                           Anesthesia Physical Anesthesia Plan  ASA: II  Anesthesia Plan: Spinal and MAC   Post-op Pain Management:    Induction: Intravenous  Airway Management Planned: Simple Face Mask and Natural Airway  Additional Equipment:   Intra-op Plan:   Post-operative Plan:   Informed Consent: I have reviewed the patients History and Physical, chart, labs and discussed the procedure including the risks, benefits and alternatives for the proposed anesthesia with the patient or authorized representative who has indicated his/her understanding and acceptance.     Plan Discussed with: CRNA  Anesthesia Plan Comments:        Anesthesia Quick Evaluation

## 2014-07-07 NOTE — Anesthesia Postprocedure Evaluation (Signed)
  Anesthesia Post-op Note  Patient: Brenda Pham  Procedure(s) Performed: Procedure(s): RIGHTYTOTAL KNEE ARTHROPLASTY (Right)  Patient Location: PACU  Anesthesia Type:Spinal  Level of Consciousness: awake and alert   Airway and Oxygen Therapy: Patient Spontanous Breathing  Post-op Pain: none  Post-op Assessment: Post-op Vital signs reviewed  Post-op Vital Signs: Reviewed  Last Vitals:  Filed Vitals:   07/07/14 1355  BP: 126/69  Pulse: 83  Temp: 36.4 C  Resp: 12    Complications: No apparent anesthesia complications

## 2014-07-07 NOTE — Anesthesia Procedure Notes (Addendum)
Spinal Patient location during procedure: OR Staffing Anesthesiologist: Suzette Battiest E Performed by: anesthesiologist  Preanesthetic Checklist Completed: patient identified, site marked, surgical consent, pre-op evaluation, timeout performed, IV checked, risks and benefits discussed and monitors and equipment checked Spinal Block Patient position: sitting Prep: Betadine Patient monitoring: heart rate, continuous pulse ox and blood pressure Approach: right paramedian Injection technique: single-shot Needle Needle type: Spinocan  Needle gauge: 22 G Needle length: 9 cm Additional Notes Expiration date of kit checked and confirmed. Patient tolerated procedure well, without complications.    Procedure Name: MAC Date/Time: 07/07/2014 9:32 AM Performed by: Carleene Cooper A Pre-anesthesia Checklist: Patient identified, Timeout performed, Emergency Drugs available, Suction available and Patient being monitored Patient Re-evaluated:Patient Re-evaluated prior to inductionOxygen Delivery Method: Simple face mask Dental Injury: Teeth and Oropharynx as per pre-operative assessment

## 2014-07-07 NOTE — Transfer of Care (Signed)
Immediate Anesthesia Transfer of Care Note  Patient: Brenda Pham  Procedure(s) Performed: Procedure(s): RIGHTYTOTAL KNEE ARTHROPLASTY (Right)  Patient Location: PACU  Anesthesia Type:Spinal  Level of Consciousness: awake, alert  and patient cooperative  Airway & Oxygen Therapy: Patient Spontanous Breathing and Patient connected to face mask oxygen  Post-op Assessment: Report given to RN, Post -op Vital signs reviewed and stable and SAB level can feel her feet but unable to move.  Denies pain.  Post vital signs: Reviewed and stable  Last Vitals: There were no vitals filed for this visit.  Complications: No apparent anesthesia complications

## 2014-07-07 NOTE — Evaluation (Signed)
Physical Therapy Evaluation Patient Details Name: Brenda Pham MRN: [id number] DOB: Mar 14, 1941 Today's Date: 07/07/2014   History of Present Illness  R TKA  Clinical Impression  Pt is s/p TKA resulting in the deficits listed below (see PT Problem List).  Pt will benefit from skilled PT to increase their independence and safety with mobility to allow discharge to the venue listed below.   Pt walked 30' with RW with min/guard assist. Initiated TKA exercises. Good progress expected. Pt will have 24* assist at home from her daughter and son in law. HHPT recommended. *    Follow Up Recommendations Home health PT    Equipment Recommendations  None recommended by PT    Recommendations for Other Services OT consult     Precautions / Restrictions Precautions Precautions: Knee Restrictions Weight Bearing Restrictions: No Other Position/Activity Restrictions: WBAT      Mobility  Bed Mobility Overal bed mobility: Modified Independent             General bed mobility comments: HOB up, self assisted RLE with LLE  Transfers Overall transfer level: Needs assistance Equipment used: Rolling walker (2 wheeled) Transfers: Sit to/from Stand Sit to Stand: Min assist         General transfer comment: cues for hand placement, assist to rise/steady  Ambulation/Gait Ambulation/Gait assistance: Min guard Ambulation Distance (Feet): 30 Feet Assistive device: Rolling walker (2 wheeled) Gait Pattern/deviations: Step-to pattern;Decreased step length - right   Gait velocity interpretation: Below normal speed for age/gender General Gait Details: steady with RW, good sequencing  Stairs            Wheelchair Mobility    Modified Rankin (Stroke Patients Only)       Balance Overall balance assessment: Modified Independent                                           Pertinent Vitals/Pain Pain Assessment: 0-10 Pain Score: 6  Pain Location: R  knee Pain Descriptors / Indicators: Sore Pain Intervention(s): Premedicated before session;Monitored during session;Limited activity within patient's tolerance;Ice applied    Home Living Family/patient expects to be discharged to:: Private residence Living Arrangements: Children (daughter and son in law) Available Help at Discharge: Family;Available 24 hours/day Type of Home: House Home Access: Stairs to enter;Ramped entrance   Entrance Stairs-Number of Steps: 3 Home Layout: One level Home Equipment: Cane - single point;Walker - 2 wheels      Prior Function Level of Independence: Independent with assistive device(s)         Comments: cane at times     Hand Dominance        Extremity/Trunk Assessment   Upper Extremity Assessment: Overall WFL for tasks assessed           Lower Extremity Assessment: RLE deficits/detail RLE Deficits / Details: 5-35* R knee AAROM, SLR 3/5, ankle WNL    Cervical / Trunk Assessment: Normal  Communication   Communication: No difficulties  Cognition Arousal/Alertness: Awake/alert Behavior During Therapy: WFL for tasks assessed/performed Overall Cognitive Status: Within Functional Limits for tasks assessed                      General Comments      Exercises Total Joint Exercises Ankle Circles/Pumps: AROM;Both;10 reps Quad Sets: AROM;5 reps;Right;Supine Heel Slides: AAROM;Right;10 reps;Supine Straight Leg Raises: AROM;Right;5 reps;Supine Goniometric ROM: 5-30* R knee flexion  AAROM      Assessment/Plan    PT Assessment Patient needs continued PT services  PT Diagnosis Difficulty walking;Acute pain   PT Problem List Decreased strength;Decreased activity tolerance;Decreased range of motion;Pain;Decreased mobility;Decreased knowledge of use of DME  PT Treatment Interventions DME instruction;Gait training;Stair training;Functional mobility training;Therapeutic activities;Patient/family education;Therapeutic exercise   PT  Goals (Current goals can be found in the Care Plan section) Acute Rehab PT Goals Patient Stated Goal: to walk  PT Goal Formulation: With patient/family Time For Goal Achievement: 07/21/14 Potential to Achieve Goals: Good    Frequency 7X/week   Barriers to discharge        Co-evaluation               End of Session Equipment Utilized During Treatment: Gait belt Activity Tolerance: Patient tolerated treatment well Patient left: in chair;with call bell/phone within reach;with family/visitor present Nurse Communication: Mobility status         Time: 7867-5449 PT Time Calculation (min) (ACUTE ONLY): 25 min   Charges:   PT Evaluation $Initial PT Evaluation Tier I: 1 Procedure PT Treatments $Gait Training: 8-22 mins   PT G Codes:        Philomena Doheny 07/07/2014, 4:43 PM [phone]

## 2014-07-07 NOTE — Progress Notes (Signed)
Utilization review completed.  

## 2014-07-07 NOTE — Op Note (Signed)
NAME:  [name] RECORD NO.:  [id number]                             FACILITY:  [facility]      PHYSICIAN:  Pietro Cassis. Alvan Dame, M.D.  DATE OF BIRTH:  25-Jul-1940      DATE OF PROCEDURE:  07/07/2014                                     OPERATIVE REPORT         PREOPERATIVE DIAGNOSIS:  Right knee osteoarthritis.      POSTOPERATIVE DIAGNOSIS:  Right knee osteoarthritis.      FINDINGS:  The patient was noted to have complete loss of cartilage and   bone-on-bone arthritis with associated osteophytes in all three compartments of   the knee with a significant synovitis and associated effusion.      PROCEDURE:  Right total knee replacement.      COMPONENTS USED:  [employer] rotating platform posterior stabilized knee   system, a size 4N femur, 3 tibia, 10 mm PS insert, and 41 patellar   button.      SURGEON:  Pietro Cassis. Alvan Dame, M.D.      ASSISTANT:  Danae Orleans, PA-C.      ANESTHESIA:  Spinal.      SPECIMENS:  None.      COMPLICATION:  None.      DRAINS:  None.  EBL: <50cc      TOURNIQUET TIME:   Total Tourniquet Time Documented: Thigh (Right) - 41 minutes Total: Thigh (Right) - 41 minutes  .      The patient was stable to the recovery room.      INDICATION FOR PROCEDURE:  Brenda Pham is a 74 y.o. female patient of   mine.  The patient had been seen, evaluated, and treated conservatively in the   office with medication, activity modification, and injections.  The patient had   radiographic changes of bone-on-bone arthritis with endplate sclerosis and osteophytes noted.      The patient failed conservative measures including medication, injections, and activity modification, and at this point was ready for more definitive measures.   Based on the radiographic changes and failed conservative measures, the patient   decided to proceed with total knee replacement.  Risks of infection,   DVT, component failure, need for revision  surgery, postop course, and   expectations were all   discussed and reviewed.  Consent was obtained for benefit of pain   relief.      PROCEDURE IN DETAIL:  The patient was brought to the operative theater.   Once adequate anesthesia, preoperative antibiotics, 2 gm of Ancef, 1gm of Tranexamic Acid, and 10mg  of Decadron administered, the patient was positioned supine with the right thigh tourniquet placed.  The  right lower extremity was prepped and draped in sterile fashion.  A time-   out was performed identifying the patient, planned procedure, and   extremity.      The right lower extremity was placed in the [facility] leg holder.  The leg was   exsanguinated, tourniquet elevated to 250 mmHg.  A midline incision was   made followed by median parapatellar arthrotomy.  Following  initial   exposure, attention was first directed to the patella.  Precut   measurement was noted to be 25 mm.  I resected down to 14 mm and used a   41 patellar button to restore patellar height as well as cover the cut   surface.      The lug holes were drilled and a metal shim was placed to protect the   patella from retractors and saw blades.      At this point, attention was now directed to the femur.  The femoral   canal was opened with a drill, irrigated to try to prevent fat emboli.  An   intramedullary rod was passed at 3 degrees valgus, 11 mm of bone was   resected off the distal femur due to pre-operative flexion contracture.  Following this resection, the tibia was   subluxated anteriorly.  Using the extramedullary guide, 10 mm of bone was resected off   the proximal lateral tibia.  We confirmed the gap would be   stable medially and laterally with a 10 mm insert as well as confirmed   the cut was perpendicular in the coronal plane, checking with an alignment rod.      Once this was done, I sized the femur to be a size 4 in the anterior-   posterior dimension, chose a narrow component based on medial  and   lateral dimension.  The size 4 rotation block was then pinned in   position anterior referenced using the C-clamp to set rotation.  The   anterior, posterior, and  chamfer cuts were made without difficulty nor   notching making certain that I was along the anterior cortex to help   with flexion gap stability.      The final box cut was made off the lateral aspect of distal femur.      At this point, the tibia was sized to be a size 3, the size 3 tray was   then pinned in position through the medial third of the tubercle,   drilled, and keel punched.  Trial reduction was now carried with a 4N femur,  3 tibia, a 10 mm insert, and the 41 patella botton.  The knee was brought to   extension, full extension with good flexion stability with the patella   tracking through the trochlea without application of pressure.  Given   all these findings, the trial components removed.  Final components were   opened and cement was mixed.  The knee was irrigated with normal saline   solution and pulse lavage.  The synovial lining was   then injected with 30cc of 0.25% Marcaine with epinephrine and 1 cc of Toradol plus 30cc of NS   total of 61 cc.      The knee was irrigated.  Final implants were then cemented onto clean and   dried cut surfaces of bone with the knee brought to extension with a 10 mm trial insert.      Once the cement had fully cured, the excess cement was removed   throughout the knee.  I confirmed I was satisfied with the range of   motion and stability, and the final 10 mm PS insert was chosen.  It was   placed into the knee.      The tourniquet had been let down at 41 minutes.  No significant   hemostasis required.  The   extensor mechanism was then reapproximated using #1 Vicryl with  the knee   in flexion.  The   remaining wound was closed with 2-0 Vicryl and running 4-0 Monocryl.   The knee was cleaned, dried, dressed sterilely using Dermabond and   Aquacel dressing.  The  patient was then   brought to recovery room in stable condition, tolerating the procedure   well.   Please note that Physician Assistant, Danae Orleans, PA-C, was present for the entirety of the case, and was utilized for pre-operative positioning, peri-operative retractor management, general facilitation of the procedure.  He was also utilized for primary wound closure at the end of the case.              Pietro Cassis Alvan Dame, M.D.    07/07/2014 11:18 AM

## 2014-07-08 LAB — GLUCOSE, CAPILLARY
Glucose-Capillary: 118 mg/dL — ABNORMAL HIGH (ref 70–99)
Glucose-Capillary: 137 mg/dL — ABNORMAL HIGH (ref 70–99)

## 2014-07-08 LAB — CBC
HCT: 32.3 % — ABNORMAL LOW (ref 36.0–46.0)
Hemoglobin: 10.8 g/dL — ABNORMAL LOW (ref 12.0–15.0)
MCH: 28.9 pg (ref 26.0–34.0)
MCHC: 33.4 g/dL (ref 30.0–36.0)
MCV: 86.4 fL (ref 78.0–100.0)
Platelets: 156 10*3/uL (ref 150–400)
RBC: 3.74 MIL/uL — ABNORMAL LOW (ref 3.87–5.11)
RDW: 13.2 % (ref 11.5–15.5)
WBC: 8.4 10*3/uL (ref 4.0–10.5)

## 2014-07-08 LAB — BASIC METABOLIC PANEL
Anion gap: 7 (ref 5–15)
BUN: 23 mg/dL (ref 6–23)
CO2: 24 mmol/L (ref 19–32)
Calcium: 9.4 mg/dL (ref 8.4–10.5)
Chloride: 106 mmol/L (ref 96–112)
Creatinine, Ser: 0.79 mg/dL (ref 0.50–1.10)
GFR calc Af Amer: >90 mL/min (ref >90)
GFR calc non Af Amer: 80 mL/min — ABNORMAL LOW (ref >90)
Glucose, Bld: 143 mg/dL — ABNORMAL HIGH (ref 70–99)
Potassium: 4.7 mmol/L (ref 3.5–5.1)
Sodium: 137 mmol/L (ref 135–145)

## 2014-07-08 NOTE — Progress Notes (Signed)
Physical Therapy Treatment Patient Details Name: Brenda Pham MRN: [id number] DOB: 01-Dec-1940 Today's Date: 07/08/2014    History of Present Illness R TKA    PT Comments    POD # 1 pm session.  Pt coming out of BR with NT so assisted with amb in hallway an increased distance then assisted back to bed.  Pt instructed on proper positioning R LE in extension and use of ICE.  Pt progressing well and plans to D/C to home tomorrow.   Follow Up Recommendations  Home health PT     Equipment Recommendations  None recommended by PT (using her husbands old walker)    Recommendations for Other Services       Precautions / Restrictions Precautions Precautions: Knee Restrictions Weight Bearing Restrictions: No Other Position/Activity Restrictions: WBAT    Mobility  Bed Mobility Overal bed mobility: Needs Assistance Bed Mobility: Sit to Supine       Sit to supine: Independent;Supervision   General bed mobility comments: pt able to get self back into bed at Supervision level  Transfers Overall transfer level: Needs assistance Equipment used: Rolling walker (2 wheeled) Transfers: Sit to/from Stand Sit to Stand: Supervision         General transfer comment: one VC for hand placement only with stand to sit  Ambulation/Gait Ambulation/Gait assistance: Supervision;Min guard Ambulation Distance (Feet): 120 Feet Assistive device: Rolling walker (2 wheeled) Gait Pattern/deviations: Step-to pattern Gait velocity: decreased   General Gait Details: tolerated increased distance and increased Building control surveyor    Modified Rankin (Stroke Patients Only)       Balance                                    Cognition Arousal/Alertness: Awake/alert Behavior During Therapy: WFL for tasks assessed/performed Overall Cognitive Status: Within Functional Limits for tasks assessed                      Exercises       General Comments        Pertinent Vitals/Pain Pain Assessment: 0-10 Pain Score: 5  Pain Location: R knee Pain Descriptors / Indicators: Aching;Sore Pain Intervention(s): Monitored during session;Repositioned;Ice applied    Home Living                      Prior Function            PT Goals (current goals can now be found in the care plan section) Progress towards PT goals: Progressing toward goals    Frequency  7X/week    PT Plan      Co-evaluation             End of Session Equipment Utilized During Treatment: Gait belt Activity Tolerance: Patient tolerated treatment well Patient left: in bed;with call bell/phone within reach;with family/visitor present     Time: 1450-1515 PT Time Calculation (min) (ACUTE ONLY): 25 min  Charges:  $Gait Training: 8-22 mins $Therapeutic Activity: 8-22 mins                    G Codes:      Rica Koyanagi  PTA [facility]  Acute  Rehab Pager      [phone]

## 2014-07-08 NOTE — Evaluation (Signed)
Occupational Therapy Evaluation Patient Details Name: Brenda Pham MRN: [id number] DOB: 11/09/1940 Today's Date: 07/08/2014    History of Present Illness R TKA   Clinical Impression   Pt admitted for the above surgery and has the minor deficits listed below. Pt would benefit from cont OT to increase independence with basic adls and adl tranfers to the mod I level so she can d/c home and take care of herself.    Follow Up Recommendations  No OT follow up;Supervision - Intermittent    Equipment Recommendations  None recommended by OT    Recommendations for Other Services       Precautions / Restrictions Precautions Precautions: Knee Restrictions Weight Bearing Restrictions: No Other Position/Activity Restrictions: WBAT      Mobility Bed Mobility Overal bed mobility: Modified Independent                Transfers Overall transfer level: Needs assistance Equipment used: Rolling walker (2 wheeled) Transfers: Sit to/from Stand Sit to Stand: Supervision         General transfer comment: cues for hand placement only    Balance Overall balance assessment: No apparent balance deficits (not formally assessed)                                          ADL Overall ADL's : Needs assistance/impaired Eating/Feeding: Independent   Grooming: Wash/dry hands;Wash/dry face;Oral care;Supervision/safety;Standing   Upper Body Bathing: Set up;Sitting   Lower Body Bathing: Supervison/ safety;Sit to/from stand   Upper Body Dressing : Set up;Sitting   Lower Body Dressing: Supervision/safety;Sit to/from stand   Toilet Transfer: Min guard;Ambulation;Comfort height toilet   Toileting- Clothing Manipulation and Hygiene: Supervision/safety;Sit to/from stand   Tub/ Shower Transfer: Walk-in shower;Minimal assistance;Ambulation;Rolling walker;Grab bars Tub/Shower Transfer Details (indicate cue type and reason): Pt required min assist to maintain balance  to get unaffected leg over ledge of the shower. Functional mobility during ADLs: Supervision/safety;Rolling walker General ADL Comments: Pt did very well with adls and is moving well in the bathroom and in her room.     Vision Vision Assessment?: No apparent visual deficits   Perception     Praxis      Pertinent Vitals/Pain Pain Assessment: 0-10 Pain Score: 5  Pain Location: R knee Pain Descriptors / Indicators: Aching Pain Intervention(s): Premedicated before session;Repositioned;Ice applied;Limited activity within patient's tolerance     Hand Dominance Right   Extremity/Trunk Assessment Upper Extremity Assessment Upper Extremity Assessment: Overall WFL for tasks assessed   Lower Extremity Assessment Lower Extremity Assessment: Defer to PT evaluation   Cervical / Trunk Assessment Cervical / Trunk Assessment: Normal   Communication Communication Communication: No difficulties   Cognition Arousal/Alertness: Awake/alert Behavior During Therapy: WFL for tasks assessed/performed Overall Cognitive Status: Within Functional Limits for tasks assessed                     General Comments       Exercises       Shoulder Instructions      Home Living Family/patient expects to be discharged to:: Private residence Living Arrangements: Children Available Help at Discharge: Family;Available 24 hours/day Type of Home: House Home Access: Stairs to enter;Ramped entrance Entrance Stairs-Number of Steps: 3   Home Layout: One level     Bathroom Shower/Tub: Walk-in shower;Door   [employer] Toilet: Standard Bathroom Accessibility: Yes How Accessible: Accessible via walker Home  Equipment: Cane - single point;Walker - 2 wheels;Shower seat;Bedside commode;Grab bars - toilet   Additional Comments: pt cares for husband butt she has someone to assist him while she heals.      Prior Functioning/Environment Level of Independence: Independent with assistive device(s)         Comments: cane at times    OT Diagnosis: Acute pain;Generalized weakness   OT Problem List: Decreased strength;Decreased knowledge of use of DME or AE;Pain   OT Treatment/Interventions: Self-care/ADL training;Therapeutic activities    OT Goals(Current goals can be found in the care plan section) Acute Rehab OT Goals Patient Stated Goal: to walk  OT Goal Formulation: With patient Time For Goal Achievement: 07/15/14 Potential to Achieve Goals: Good ADL Goals Pt Will Perform Tub/Shower Transfer: Shower transfer;shower seat;grab bars;rolling walker;with supervision;ambulating Additional ADL Goal #1: Pt will complete all aspects of toileting with 3:1 over commode with mod I. Additional ADL Goal #2: Pt will dress self with mod I.  OT Frequency: Min 2X/week   Barriers to D/C:            Co-evaluation              End of Session Equipment Utilized During Treatment: Surveyor, mining Communication: Mobility status  Activity Tolerance: Patient tolerated treatment well Patient left: in chair;with call bell/phone within reach;with family/visitor present   Time: 1010-1040 OT Time Calculation (min): 30 min Charges:  OT General Charges $OT Visit: 1 Procedure OT Evaluation $Initial OT Evaluation Tier I: 1 Procedure OT Treatments $Self Care/Home Management : 8-22 mins G-Codes:    Glenford Peers 07/31/2014, 11:02 AM  [phone]

## 2014-07-08 NOTE — Progress Notes (Signed)
Physical Therapy Treatment Patient Details Name: Brenda Pham MRN: [id number] DOB: [birth year] Today's Date: 07/08/2014    History of Present Illness R TKA    PT Comments    POD # 1 am session.  Pt OOB in recliner.  Assisted with amb in hallway then perform TKR TE's followed by ICE.   Follow Up Recommendations  Home health PT     Equipment Recommendations  None recommended by PT (using her husbands old walker)    Recommendations for Other Services       Precautions / Restrictions Precautions Precautions: Knee Restrictions Weight Bearing Restrictions: No Other Position/Activity Restrictions: WBAT    Mobility  Bed Mobility               General bed mobility comments: Pt OOB in recliner  Transfers Overall transfer level: Needs assistance Equipment used: Rolling walker (2 wheeled) Transfers: Sit to/from Stand Sit to Stand: Supervision         General transfer comment: one VC for hand placement only with stand to sit  Ambulation/Gait Ambulation/Gait assistance: Supervision;Min guard Ambulation Distance (Feet): 75 Feet Assistive device: Rolling walker (2 wheeled) Gait Pattern/deviations: Step-to pattern Gait velocity: decreased   General Gait Details: <25% VC's on safety with turns and proper walker to self distance   Stairs            Wheelchair Mobility    Modified Rankin (Stroke Patients Only)       Balance                                    Cognition Arousal/Alertness: Awake/alert Behavior During Therapy: WFL for tasks assessed/performed Overall Cognitive Status: Within Functional Limits for tasks assessed                      Exercises   Total Knee Replacement TE's 10 reps B LE ankle pumps 10 reps towel squeezes 10 reps knee presses 10 reps heel slides  10 reps SAQ's 10 reps SLR's 10 reps ABD Followed by ICE     General Comments        Pertinent Vitals/Pain Pain Assessment: 0-10 Pain  Score: 5  Pain Location: R knee Pain Descriptors / Indicators: Aching;Sore Pain Intervention(s): Monitored during session;Repositioned;Ice applied    Home Living                      Prior Function            PT Goals (current goals can now be found in the care plan section) Progress towards PT goals: Progressing toward goals    Frequency  7X/week    PT Plan      Co-evaluation             End of Session Equipment Utilized During Treatment: Gait belt Activity Tolerance: Patient tolerated treatment well Patient left: in chair;with call bell/phone within reach;with family/visitor present     Time: 6720-9470 PT Time Calculation (min) (ACUTE ONLY): 28 min  Charges:  $Gait Training: 8-22 mins $Therapeutic Exercise: 8-22 mins                    G Codes:      Rica Koyanagi  PTA [facility]  Acute  Rehab Pager      [phone]

## 2014-07-08 NOTE — Progress Notes (Signed)
Patient ID: Brenda Pham, female   DOB: 1940-07-19, 74 y.o.   MRN: [id number] Subjective: 1 Day Post-Op Procedure(s) (LRB): RIGHT TOTAL KNEE ARTHROPLASTY (Right)    Patient reports pain as moderate but happy with initial progress, out of bed yesterday and did some walking  Objective:   VITALS:   Filed Vitals:   07/08/14 0622  BP: 104/49  Pulse: 84  Temp: 97.7 F (36.5 C)  Resp: 16    Neurovascular intact Incision: dressing C/D/I  LABS  Recent Labs  07/08/14 0522  HGB 10.8*  HCT 32.3*  WBC 8.4  PLT 156     Recent Labs  07/08/14 0522  NA 137  K 4.7  BUN 23  CREATININE 0.79  GLUCOSE 143*    No results for input(s): LABPT, INR in the last 72 hours.   Assessment/Plan: 1 Day Post-Op Procedure(s) (LRB): RIGHT TOTAL KNEE ARTHROPLASTY (Right)   Advance diet Up with therapy Discharge home with home health, I hope if she continues to make steady progress.  I think she will do great

## 2014-07-09 DIAGNOSIS — E663 Overweight: Secondary | ICD-10-CM | POA: Diagnosis present

## 2014-07-09 LAB — CBC
HCT: 30 % — ABNORMAL LOW (ref 36.0–46.0)
Hemoglobin: 9.8 g/dL — ABNORMAL LOW (ref 12.0–15.0)
MCH: 28.3 pg (ref 26.0–34.0)
MCHC: 32.7 g/dL (ref 30.0–36.0)
MCV: 86.7 fL (ref 78.0–100.0)
Platelets: 132 10*3/uL — ABNORMAL LOW (ref 150–400)
RBC: 3.46 MIL/uL — ABNORMAL LOW (ref 3.87–5.11)
RDW: 13.5 % (ref 11.5–15.5)
WBC: 4.3 10*3/uL (ref 4.0–10.5)

## 2014-07-09 LAB — BASIC METABOLIC PANEL
Anion gap: 7 (ref 5–15)
BUN: 24 mg/dL — ABNORMAL HIGH (ref 6–23)
CO2: 24 mmol/L (ref 19–32)
Calcium: 8.9 mg/dL (ref 8.4–10.5)
Chloride: 105 mmol/L (ref 96–112)
Creatinine, Ser: 0.84 mg/dL (ref 0.50–1.10)
GFR calc Af Amer: 77 mL/min — ABNORMAL LOW (ref >90)
GFR calc non Af Amer: 67 mL/min — ABNORMAL LOW (ref >90)
Glucose, Bld: 129 mg/dL — ABNORMAL HIGH (ref 70–99)
Potassium: 4.4 mmol/L (ref 3.5–5.1)
Sodium: 136 mmol/L (ref 135–145)

## 2014-07-09 LAB — GLUCOSE, CAPILLARY: Glucose-Capillary: 171 mg/dL — ABNORMAL HIGH (ref 70–99)

## 2014-07-09 MED ORDER — TIZANIDINE HCL 4 MG PO TABS: 4.0000 mg | ORAL_TABLET | Freq: Four times a day (QID) | ORAL | Status: DC | PRN

## 2014-07-09 MED ORDER — POLYETHYLENE GLYCOL 3350 17 G PO PACK: 17.0000 g | PACK | Freq: Two times a day (BID) | ORAL | Status: DC

## 2014-07-09 MED ORDER — ASPIRIN 325 MG PO TBEC: 325.0000 mg | DELAYED_RELEASE_TABLET | Freq: Two times a day (BID) | ORAL | Status: AC

## 2014-07-09 MED ORDER — FERROUS SULFATE 325 (65 FE) MG PO TABS: 325.0000 mg | ORAL_TABLET | Freq: Three times a day (TID) | ORAL | Status: DC

## 2014-07-09 MED ORDER — DOCUSATE SODIUM 100 MG PO CAPS: 100.0000 mg | ORAL_CAPSULE | Freq: Two times a day (BID) | ORAL | Status: DC

## 2014-07-09 MED ORDER — HYDROCODONE-ACETAMINOPHEN 7.5-325 MG PO TABS: 1.0000 | ORAL_TABLET | ORAL | Status: DC | PRN

## 2014-07-09 NOTE — Progress Notes (Signed)
     Subjective: 2 Days Post-Op Procedure(s) (LRB): RIGHT TOTAL KNEE ARTHROPLASTY (Right)   Patient reports pain as mild, pain controlled. No events throughout the night. Worked well with PT yesterday. Ready to be discharged home.  Objective:   VITALS:   Filed Vitals:   07/09/14 0457  BP: 115/56  Pulse: 86  Temp: 97.7 F (36.5 C)  Resp: 16    Dorsiflexion/Plantar flexion intact Incision: dressing C/D/I No cellulitis present Compartment soft  LABS  Recent Labs  07/08/14 0522 07/09/14 0456  HGB 10.8* 9.8*  HCT 32.3* 30.0*  WBC 8.4 4.3  PLT 156 132*     Recent Labs  07/08/14 0522 07/09/14 0456  NA 137 136  K 4.7 4.4  BUN 23 24*  CREATININE 0.79 0.84  GLUCOSE 143* 129*     Assessment/Plan: 2 Days Post-Op Procedure(s) (LRB): RIGHT TOTAL KNEE ARTHROPLASTY (Right) Up with therapy Discharge home with home health  Follow up in 2 weeks at [facility]. Follow up with OLIN,Brendyn [facility]claren D in 2 weeks.  Contact information:  [facility] [address], [place] [phone]    Overweight (BMI 25-29.9) Estimated body mass index is 26.71 kg/(m^2) as calculated from the following:   Height as of this encounter: 5' 4.5" (1.638 m).   Weight as of this encounter: 71.668 kg (158 lb). Patient also counseled that weight may inhibit the healing process Patient counseled that losing weight will help with future health issues      [place] Pugh. Yazaira Speas   PAC  07/09/2014, 9:48 AM

## 2014-07-09 NOTE — Progress Notes (Signed)
Physical Therapy Treatment Patient Details Name: Brenda Pham MRN: [id number] DOB: 1940/11/25 Today's Date: 07/09/2014    History of Present Illness R TKA    PT Comments    POD # 2 am session.  NT reported pt vomited earlier.  Assisted pt OOB to amb to BR.  Assisted in bathroom set up only with 25% VC's on safety with turns and proper hand placement.  Amb in hallway increased distance.  Assisted back to bed per pt request to take a nap. Pt hoping to feel better after a nap.  Follow Up Recommendations  Home health PT     Equipment Recommendations  None recommended by PT    Recommendations for Other Services       Precautions / Restrictions Precautions Precautions: Knee Restrictions Weight Bearing Restrictions: No Other Position/Activity Restrictions: WBAT    Mobility  Bed Mobility  Min Assist to support R LE off bed and increased time  Min Assist to support R LE back onto bed.              Transfers Overall transfer level: Needs assistance Equipment used: Rolling walker (2 wheeled)   Sit to Stand: Supervision         General transfer comment: one VC for hand placement only with stand to sit  Ambulation/Gait Ambulation/Gait assistance: Supervision;Min guard Ambulation Distance (Feet): 130 Feet Assistive device: Rolling walker (2 wheeled) Gait Pattern/deviations: Step-to pattern Gait velocity: decreased   General Gait Details: tolerated increased distance and increased Building control surveyor    Modified Rankin (Stroke Patients Only)       Balance                                    Cognition Arousal/Alertness: Awake/alert Behavior During Therapy: WFL for tasks assessed/performed Overall Cognitive Status: Within Functional Limits for tasks assessed                      Exercises      General Comments        Pertinent Vitals/Pain Pain Score: 5  Pain Location: R knee Pain  Descriptors / Indicators: Aching;Sore Pain Intervention(s): Monitored during session;Premedicated before session;Repositioned;Ice applied    Home Living                      Prior Function            PT Goals (current goals can now be found in the care plan section) Progress towards PT goals: Progressing toward goals    Frequency  7X/week    PT Plan      Co-evaluation             End of Session Equipment Utilized During Treatment: Gait belt Activity Tolerance: Patient tolerated treatment well Patient left: in chair;with call bell/phone within reach;with family/visitor present     Time: 2751-7001 PT Time Calculation (min) (ACUTE ONLY): 45 min  Charges:  $Gait Training: 8-22 mins $Therapeutic Activity: 23-37 mins                    G Codes:      Rica Koyanagi  PTA [facility]  Acute  Rehab Pager      [phone]

## 2014-07-09 NOTE — Plan of Care (Signed)
Problem: Phase III Progression Outcomes Goal: Pain controlled on oral analgesia Outcome: Progressing Pt skipped 1800 dose of po meds and was having level 9 pain at 2200 requiring IV pain medication. Understands that she needs to continue to keep medication in her system to avoid pain rebound as she experienced this pm.

## 2014-07-09 NOTE — Progress Notes (Signed)
Physical Therapy Treatment Patient Details Name: Brenda Pham MRN: [id number] DOB: 1941/01/04 Today's Date: 07/09/2014    History of Present Illness R TKA    PT Comments    POD # 2 pm session.  Pt stated she was feeling "better" after her nap.  Assisted OOB to BR to void then amb in hallway.  Had daughter perform hands on mobility with pt under direction.  Performed one step (curb) and 2 steps with B rails.  amb back to room then assisted back to bed to perform TKR TE's.  HEP handout given to daughter and instructed on proper tech and frequency.  Pt was doing well until she vomited after performing heel slides.  Believe pain/excersion contributed.  Reported to RN.  Pt stated she was a "vomiter". ICE applied to knee and all therapy questions addressed.  Pt still hoped to D/C to home today.  Daughter plans to assist.  Follow Up Recommendations  Home health PT     Equipment Recommendations  None recommended by PT    Recommendations for Other Services       Precautions / Restrictions Precautions Precautions: Knee Restrictions Weight Bearing Restrictions: No Other Position/Activity Restrictions: WBAT    Mobility  Bed Mobility Overal bed mobility: Needs Assistance Bed Mobility: Supine to Sit;Sit to Supine     Supine to sit: Supervision Sit to supine: Supervision   General bed mobility comments: pt able to self raise R LE on/off bed with increased time  Transfers Overall transfer level: Needs assistance Equipment used: Rolling walker (2 wheeled)   Sit to Stand: Supervision         General transfer comment: one VC for hand placement only with stand to sit  Ambulation/Gait Ambulation/Gait assistance: Supervision;Min guard Ambulation Distance (Feet): 75 Feet Assistive device: Rolling walker (2 wheeled) Gait Pattern/deviations: Step-to pattern Gait velocity: decreased   General Gait Details: tolerated well with increase WBing thru R LE and increased  posture   Stairs Stairs: Yes Stairs assistance: Min guard Stair Management: No rails;Two rails;Step to pattern;Forwards;With walker Number of Stairs: 3 General stair comments: performed one step forward with RW and also performed 2 steps forwarsd with B rails at 25% VC's on proper sequencing and safety.    Wheelchair Mobility    Modified Rankin (Stroke Patients Only)       Balance                                    Cognition Arousal/Alertness: Awake/alert Behavior During Therapy: WFL for tasks assessed/performed Overall Cognitive Status: Within Functional Limits for tasks assessed                      Exercises   Total Knee Replacement TE's 10 reps B LE ankle pumps 10 reps towel squeezes 10 reps knee presses 10 reps heel slides  10 reps SAQ's 10 reps SLR's 10 reps ABD Followed by ICE     General Comments        Pertinent Vitals/Pain Pain Score: 5  Pain Location: R knee Pain Descriptors / Indicators: Aching;Sore Pain Intervention(s): Monitored during session;Premedicated before session;Repositioned;Ice applied    Home Living                      Prior Function            PT Goals (current goals can now be found in the  care plan section) Progress towards PT goals: Progressing toward goals    Frequency  7X/week    PT Plan      Co-evaluation             End of Session Equipment Utilized During Treatment: Gait belt Activity Tolerance: Patient tolerated treatment well Patient left: in chair;with call bell/phone within reach;with family/visitor present     Time: 1340-1430 PT Time Calculation (min) (ACUTE ONLY): 50 min  Charges:  $Gait Training: 8-22 mins $Therapeutic Exercise: 8-22 mins $Therapeutic Activity: 8-22 mins                    G Codes:      Rica Koyanagi  PTA [facility]  Acute  Rehab Pager      [phone]

## 2014-07-14 NOTE — Discharge Summary (Signed)
Physician Discharge Summary  Patient ID: Brenda Pham MRN: [id number] DOB/AGE: 1941-01-13 74 y.o.  Admit date: 07/07/2014 Discharge date: 07/09/2014   Procedures:  Procedure(s) (LRB): RIGHT TOTAL KNEE ARTHROPLASTY (Right)  Attending Physician:  Dr. Paralee Cancel   Admission Diagnoses:   Right knee primary OA / pain  Discharge Diagnoses:  Active Problems:   S/P knee replacement   Overweight (BMI 25.0-29.9)  Past Medical History  Diagnosis Date  . PONV (postoperative nausea and vomiting)   . Hyperlipidemia   . Diabetes mellitus without complication     recently dx. no oral meds or insulin.  . Arthritis     osteoarthritis -knees, hands  . Cancer     '80-right breast, surgery and radiation.    HPI:    Brenda Pham, 74 y.o. female, has a history of pain and functional disability in the right knee due to arthritis and has failed non-surgical conservative treatments for greater than 12 weeks to include NSAID's and/or analgesics, corticosteriod injections and activity modification. Onset of symptoms was gradual, starting 3+ years ago with gradually worsening course since that time. The patient noted prior procedures on the knee to include arthroscopy on the right knee(s). Patient currently rates pain in the right knee(s) at 10 out of 10 with activity. Patient has night pain, worsening of pain with activity and weight bearing, pain that interferes with activities of daily living, pain with passive range of motion, crepitus and joint swelling. Patient has evidence of periarticular osteophytes and joint space narrowing by imaging studies. There is no active infection. Risks, benefits and expectations were discussed with the patient. Risks including but not limited to the risk of anesthesia, blood clots, nerve damage, blood vessel damage, failure of the prosthesis, infection and up to and including death. Patient understand the risks, benefits and expectations and wishes to  proceed with surgery.  PCP: Leonard Downing, MD   Discharged Condition: good  Hospital Course:  Patient underwent the above stated procedure on 07/07/2014. Patient tolerated the procedure well and brought to the recovery room in good condition and subsequently to the floor.  POD #1 BP: 104/49 ; Pulse: 84 ; Temp: 97.7 F (36.5 C) ; Resp: 16 Patient reports pain as moderate but happy with initial progress, out of bed yesterday and did some walking. Dorsiflexion/plantar flexion intact, incision: dressing C/D/I, no cellulitis present and compartment soft.   LABS  Basename    HGB  10.8  HCT  32.3   POD #2  BP: 115/56 ; Pulse: 86 ; Temp: 97.7 F (36.5 C) ; Resp: 16 Patient reports pain as mild, pain controlled. No events throughout the night. Worked well with PT yesterday. Ready to be discharged home. Dorsiflexion/plantar flexion intact, incision: dressing C/D/I, no cellulitis present and compartment soft.   LABS  Basename    HGB  9.8  HCT  30.0    Discharge Exam: General appearance: alert, cooperative and no distress Extremities: Homans sign is negative, no sign of DVT, no edema, redness or tenderness in the calves or thighs and no ulcers, gangrene or trophic changes  Disposition: Home with follow up in 2 weeks   Follow-up Information    Follow up with [facility].   Why:  home health physical therapy   Contact information:   [address] [place] [address] [phone]       Follow up with [name], MD. Schedule an appointment as soon as possible for a visit in 2 weeks.  Specialty:  Orthopedic Surgery   Contact information:   [address] [place] [address] [phone]       Discharge Instructions    Call MD / Call 911    Complete by:  As directed   If you experience chest pain or shortness of breath, CALL 911 and be transported to the hospital emergency room.  If you develope a fever above 101 F, pus (white  drainage) or increased drainage or redness at the wound, or calf pain, call your surgeon's office.     Change dressing    Complete by:  As directed   Maintain surgical dressing until follow up in the clinic. If the edges start to pull up, may reinforce with tape. If the dressing is no longer working, may remove and cover with gauze and tape, but must keep the area dry and clean.  Call with any questions or concerns.     Constipation Prevention    Complete by:  As directed   Drink plenty of fluids.  Prune juice may be helpful.  You may use a stool softener, such as Colace (over the counter) 100 mg twice a day.  Use MiraLax (over the counter) for constipation as needed.     Diet - low sodium heart healthy    Complete by:  As directed      Discharge instructions    Complete by:  As directed   Maintain surgical dressing until follow up in the clinic. If the edges start to pull up, may reinforce with tape. If the dressing is no longer working, may remove and cover with gauze and tape, but must keep the area dry and clean.  Follow up in 2 weeks at [facility]. Call with any questions or concerns.     Driving restrictions    Complete by:  As directed   No driving for 4 weeks     Increase activity slowly as tolerated    Complete by:  As directed      TED hose    Complete by:  As directed   Use stockings (TED hose) for 2 weeks on both leg(s).  You may remove them at night for sleeping.     Weight bearing as tolerated    Complete by:  As directed   Laterality:  right  Extremity:  Lower             Medication List    STOP taking these medications        fenofibrate micronized 200 MG capsule  Commonly known as:  LOFIBRA     naproxen sodium 220 MG tablet  Commonly known as:  ANAPROX      TAKE these medications        aspirin 325 MG EC tablet  Take 1 tablet (325 mg total) by mouth 2 (two) times daily.     atorvastatin 20 MG tablet  Commonly known as:  LIPITOR  Take 20 mg  by mouth at bedtime.     Bromfenac Sodium 0.07 % Soln  Place 1 drop into both eyes daily.     docusate sodium 100 MG capsule  Commonly known as:  COLACE  Take 1 capsule (100 mg total) by mouth 2 (two) times daily.     ferrous sulfate 325 (65 FE) MG tablet  Take 1 tablet (325 mg total) by mouth 3 (three) times daily after meals.     FISH OIL PO  Take 200 mg by mouth daily.  HYDROcodone-acetaminophen 7.5-325 MG per tablet  Commonly known as:  NORCO  Take 1-2 tablets by mouth every 4 (four) hours as needed for moderate pain.     ofloxacin 0.3 % ophthalmic solution  Commonly known as:  OCUFLOX  Place 1 drop into both eyes 2 (two) times daily.     polyethylene glycol packet  Commonly known as:  MIRALAX / GLYCOLAX  Take 17 g by mouth 2 (two) times daily.     SYSTANE OP  Apply 1 drop to eye 3 (three) times daily as needed (Dry eyes).     Polyethyl Glycol-Propyl Glycol 0.4-0.3 % Gel  Apply 1 application to eye at bedtime.     tiZANidine 4 MG tablet  Commonly known as:  ZANAFLEX  Take 1 tablet (4 mg total) by mouth every 6 (six) hours as needed for muscle spasms.         Signed: [place]. Tahmir Kleckner   PA-C  07/14/2014, 3:09 PM

## 2015-02-02 ENCOUNTER — Other Ambulatory Visit: Payer: Self-pay | Admitting: Family Medicine

## 2015-02-02 DIAGNOSIS — R609 Edema, unspecified: Secondary | ICD-10-CM

## 2015-02-04 ENCOUNTER — Ambulatory Visit
Admission: RE | Admit: 2015-02-04 | Discharge: 2015-02-04 | Disposition: A | Discharge status: Home / Self Care | Payer: Medicare Other | Source: Ambulatory Visit | Attending: Family Medicine | Admitting: Family Medicine

## 2015-02-04 DIAGNOSIS — R609 Edema, unspecified: Secondary | ICD-10-CM

## 2015-06-06 ENCOUNTER — Encounter (HOSPITAL_COMMUNITY): Payer: Self-pay | Admitting: Emergency Medicine

## 2015-06-06 ENCOUNTER — Emergency Department (HOSPITAL_COMMUNITY)
Admission: EM | Admit: 2015-06-06 | Discharge: 2015-06-06 | Disposition: A | Discharge status: Home / Self Care | Payer: Medicare Other | Attending: Emergency Medicine | Admitting: Emergency Medicine

## 2015-06-06 ENCOUNTER — Emergency Department (EMERGENCY_DEPARTMENT_HOSPITAL)
Admission: EM | Admit: 2015-06-06 | Discharge: 2015-06-06 | Disposition: A | Discharge status: Home / Self Care | Payer: Medicare Other | Source: Home / Self Care

## 2015-06-06 DIAGNOSIS — M7989 Other specified soft tissue disorders: Secondary | ICD-10-CM

## 2015-06-06 DIAGNOSIS — Z87891 Personal history of nicotine dependence: Secondary | ICD-10-CM | POA: Insufficient documentation

## 2015-06-06 DIAGNOSIS — M19042 Primary osteoarthritis, left hand: Secondary | ICD-10-CM | POA: Insufficient documentation

## 2015-06-06 DIAGNOSIS — Z853 Personal history of malignant neoplasm of breast: Secondary | ICD-10-CM | POA: Insufficient documentation

## 2015-06-06 DIAGNOSIS — M79609 Pain in unspecified limb: Secondary | ICD-10-CM

## 2015-06-06 DIAGNOSIS — M19041 Primary osteoarthritis, right hand: Secondary | ICD-10-CM | POA: Insufficient documentation

## 2015-06-06 DIAGNOSIS — M170 Bilateral primary osteoarthritis of knee: Secondary | ICD-10-CM | POA: Insufficient documentation

## 2015-06-06 DIAGNOSIS — R2241 Localized swelling, mass and lump, right lower limb: Secondary | ICD-10-CM | POA: Diagnosis present

## 2015-06-06 DIAGNOSIS — E119 Type 2 diabetes mellitus without complications: Secondary | ICD-10-CM | POA: Insufficient documentation

## 2015-06-06 DIAGNOSIS — E785 Hyperlipidemia, unspecified: Secondary | ICD-10-CM | POA: Insufficient documentation

## 2015-06-06 DIAGNOSIS — Z79899 Other long term (current) drug therapy: Secondary | ICD-10-CM | POA: Insufficient documentation

## 2015-06-06 DIAGNOSIS — I776 Arteritis, unspecified: Secondary | ICD-10-CM | POA: Insufficient documentation

## 2015-06-06 LAB — COMPREHENSIVE METABOLIC PANEL
ALT: 24 U/L (ref 14–54)
AST: 26 U/L (ref 15–41)
Albumin: 4.8 g/dL (ref 3.5–5.0)
Alkaline Phosphatase: 112 U/L (ref 38–126)
Anion gap: 13 (ref 5–15)
BUN: 16 mg/dL (ref 6–20)
CO2: 23 mmol/L (ref 22–32)
Calcium: 10.3 mg/dL (ref 8.9–10.3)
Chloride: 104 mmol/L (ref 101–111)
Creatinine, Ser: 0.66 mg/dL (ref 0.44–1.00)
GFR calc Af Amer: >60 mL/min (ref >60)
GFR calc non Af Amer: >60 mL/min (ref >60)
Glucose, Bld: 181 mg/dL — ABNORMAL HIGH (ref 65–99)
Potassium: 4 mmol/L (ref 3.5–5.1)
Sodium: 140 mmol/L (ref 135–145)
Total Bilirubin: 0.8 mg/dL (ref 0.3–1.2)
Total Protein: 7.7 g/dL (ref 6.5–8.1)

## 2015-06-06 LAB — CBC WITH DIFFERENTIAL/PLATELET
Basophils Absolute: 0 10*3/uL (ref 0.0–0.1)
Basophils Relative: 0 %
Eosinophils Absolute: 0.2 10*3/uL (ref 0.0–0.7)
Eosinophils Relative: 3 %
HCT: 40.4 % (ref 36.0–46.0)
Hemoglobin: 13.3 g/dL (ref 12.0–15.0)
Lymphocytes Relative: 12 %
Lymphs Abs: 1 10*3/uL (ref 0.7–4.0)
MCH: 28.9 pg (ref 26.0–34.0)
MCHC: 32.9 g/dL (ref 30.0–36.0)
MCV: 87.8 fL (ref 78.0–100.0)
Monocytes Absolute: 0.5 10*3/uL (ref 0.1–1.0)
Monocytes Relative: 6 %
Neutro Abs: 6.7 10*3/uL (ref 1.7–7.7)
Neutrophils Relative %: 79 %
Platelets: 129 10*3/uL — ABNORMAL LOW (ref 150–400)
RBC: 4.6 MIL/uL (ref 3.87–5.11)
RDW: 13.7 % (ref 11.5–15.5)
WBC: 8.5 10*3/uL (ref 4.0–10.5)

## 2015-06-06 LAB — SEDIMENTATION RATE: Sed Rate: 28 mm/hr — ABNORMAL HIGH (ref 0–22)

## 2015-06-06 MED ORDER — DEXTROSE 5 % IV SOLN
1.0000 g | Freq: Once | INTRAVENOUS | Status: DC
  Filled 2015-06-06: qty 10

## 2015-06-06 MED ORDER — AMOXICILLIN-POT CLAVULANATE 875-125 MG PO TABS: 1.0000 | ORAL_TABLET | Freq: Two times a day (BID) | ORAL | Status: DC

## 2015-06-06 MED ORDER — CEFTRIAXONE SODIUM 1 G IJ SOLR
1.0000 g | Freq: Once | INTRAMUSCULAR | Status: AC
  Administered 2015-06-06: 1 g via INTRAMUSCULAR
  Filled 2015-06-06: qty 10

## 2015-06-06 NOTE — ED Notes (Signed)
Pt reports new onset swelling/redness/rash noted to right leg; hx of knee replacement March 2016.

## 2015-06-06 NOTE — Progress Notes (Signed)
VASCULAR LAB PRELIMINARY  PRELIMINARY  PRELIMINARY  PRELIMINARY  Right lower extremity venous duplex completed.    Preliminary report:  There is no DVT or SVT noted in the right lower extremity.  There are multiple enlarged lymph nodes noted in the right groin.  Ashvik Grundman, RVT 06/06/2015, 3:30 PM

## 2015-06-06 NOTE — ED Notes (Signed)
Nurse at bedside collecting labs 

## 2015-06-06 NOTE — ED Provider Notes (Signed)
CSN: [id number]     Arrival date & time 06/06/15  1229 History   First MD Initiated Contact with Patient 06/06/15 1240     Chief Complaint  Patient presents with  . Leg Swelling    HPI   75 year old female presents today with swelling to her right lower leg. Patient reports symptoms started last night with a red rash to the right lower extremity with associated swelling. She notes some baseline swelling and she's had a knee replacement performed on 07/07/2015. Notes that the swelling is over and above what normally resides, developing pain to the lower extremity. Patient notes the rash is worsened over the evening, followed up with her primary care provider today who instructed she come to the emergency room for DVT rule out. Patient denies any trauma to the lower extremity, recent prolonged immobilization, active malignancy, estrogen use, changes in any body care or household cleaning products. Patient notes that she did have a temperature of 100.8 last night, denies any fever today. She reports she is able to eat and drink without difficulty, she denies any chest pain, shortness of breath, swelling to the opposite leg or upper extremity is. She denies any rash anywhere else other than the affected leg.    Past Medical History  Diagnosis Date  . PONV (postoperative nausea and vomiting)   . Hyperlipidemia   . Diabetes mellitus without complication ([place])     recently dx. no oral meds or insulin.  . Arthritis     osteoarthritis -knees, hands  . Cancer [facility])     '80-right breast, surgery and radiation.   Past Surgical History  Procedure Laterality Date  . Cataract extraction, bilateral      last 06-11-14 left  . Appendectomy    . Dilation and curettage of uterus    . Knee arthroscopy Right   . Knee arthroscopy Left     scope with hammer toe, '06- scope for torn cartilage  . Finger surgery Right     right ring finger-excsion cyst and nodules.  . Breast surgery      Rt. breast modified  mastectomy/(reconstuction)-last '03 tranflap(tissue from abdomen); and left subcutaneoues mastectomy and implant left breast-last implant '02.  . Foot surgery Left     '10- 2nd toe   . Total knee arthroplasty Right 07/07/2014    Procedure: RIGHT TOTAL KNEE ARTHROPLASTY;  Surgeon: Paralee Cancel, MD;  Location: [facility];  Service: Orthopedics;  Laterality: Right;   No family history on file. Social History  Substance Use Topics  . Smoking status: Former Smoker -- 1.00 packs/day    Types: Cigarettes    Quit date: 07/02/1978  . Smokeless tobacco: None  . Alcohol Use: No   OB History    No data available     Review of Systems  All other systems reviewed and are negative.   Allergies  Niacin and related and Other  Home Medications   Prior to Admission medications   Medication Sig Start Date End Date Taking? Authorizing Provider  atorvastatin (LIPITOR) 20 MG tablet Take 20 mg by mouth at bedtime.   Yes Historical Provider, MD  furosemide (LASIX) 80 MG tablet Take 40 mg by mouth daily as needed for fluid or edema.   Yes Historical Provider, MD  amoxicillin-clavulanate (AUGMENTIN) 875-125 MG tablet Take 1 tablet by mouth 2 (two) times daily. 06/06/15   Okey Regal, PA-C  docusate sodium (COLACE) 100 MG capsule Take 1 capsule (100 mg total) by mouth 2 (two) times daily.  Patient not taking: Reported on 06/06/2015 07/09/14   Danae Orleans, PA-C  ferrous sulfate 325 (65 FE) MG tablet Take 1 tablet (325 mg total) by mouth 3 (three) times daily after meals. Patient not taking: Reported on 06/06/2015 07/09/14   Danae Orleans, PA-C  HYDROcodone-acetaminophen (NORCO) 7.5-325 MG per tablet Take 1-2 tablets by mouth every 4 (four) hours as needed for moderate pain. Patient not taking: Reported on 06/06/2015 07/09/14   Danae Orleans, PA-C  Polyethyl Glycol-Propyl Glycol (SYSTANE OP) Apply 1 drop to eye 3 (three) times daily as needed (Dry eyes).    Historical Provider, MD  polyethylene glycol (MIRALAX /  GLYCOLAX) packet Take 17 g by mouth 2 (two) times daily. Patient not taking: Reported on 06/06/2015 07/09/14   Danae Orleans, PA-C  tiZANidine (ZANAFLEX) 4 MG tablet Take 1 tablet (4 mg total) by mouth every 6 (six) hours as needed for muscle spasms. Patient not taking: Reported on 06/06/2015 07/09/14   Danae Orleans, PA-C   BP 136/59 mmHg  Pulse 91  Temp(Src) 97.9 F (36.6 C) (Oral)  Resp 18  Ht 5\' 5"  (1.651 m)  Wt 74.844 kg  BMI 27.46 kg/m2  SpO2 100%   Physical Exam  Constitutional: She is oriented to person, place, and time. She appears well-developed and well-nourished.  HENT:  Head: Normocephalic and atraumatic.  Eyes: Conjunctivae are normal. Pupils are equal, round, and reactive to light. Right eye exhibits no discharge. Left eye exhibits no discharge. No scleral icterus.  Neck: Normal range of motion. No JVD present. No tracheal deviation present.  Pulmonary/Chest: Effort normal. No stridor.  Musculoskeletal:  Swelling to right lower extremity. Redness to the knee down with purpura. Distal sensation intact, no signs of compartment syndrome. Pedal pulse 2 +   Neurological: She is alert and oriented to person, place, and time. Coordination normal.  Skin: Skin is warm and dry. Rash noted. There is erythema. There is pallor.  Psychiatric: She has a normal mood and affect. Her behavior is normal. Judgment and thought content normal.  Nursing note and vitals reviewed.   ED Course  Procedures (including critical care time) Labs Review Labs Reviewed  CBC WITH DIFFERENTIAL/PLATELET - Abnormal; Notable for the following:    Platelets 129 (*)    All other components within normal limits  COMPREHENSIVE METABOLIC PANEL - Abnormal; Notable for the following:    Glucose, Bld 181 (*)    All other components within normal limits  SEDIMENTATION RATE - Abnormal; Notable for the following:    Sed Rate 28 (*)    All other components within normal limits    Imaging Review No results  found. I have personally reviewed and evaluated these images and lab results as part of my medical decision-making.   EKG Interpretation None      MDM   Final diagnoses:  Vasculitis (HCC)    Labs: CBC, CMP, sedimentation rate  Imaging: Lower extremity venous ultrasound  Consults:   Therapeutics: Rocephin  Discharge Meds: Augmentin  Assessment/Plan: 75 year old female presents today with swelling to her right lower extremity. Doppler ultrasound shows no signs of DVT. She has signs of infection, likely vasculitis. She'll be started here in the ED on a gram or Rocephin, with Augmentin for home therapy. Patient is afebrile, she has no significant findings on laboratory analysis, she appears stable for home antibiotic therapy. No signs of septic arthritis. She is instructed to follow-up with her primary care provider Monday morning for reevaluation. She is given strict return precautions  including signs of worsening infection, fever, chills, nausea, vomiting, or any other concerning signs or symptoms. She is instructed to return immediately to the emergency room if any of the concerning signs or symptoms present.         Okey Regal, PA-C 06/06/15 1642  Tanna Furry, MD 06/10/15 [phone]

## 2015-08-11 ENCOUNTER — Other Ambulatory Visit: Payer: Self-pay | Admitting: Family Medicine

## 2015-08-11 DIAGNOSIS — M79604 Pain in right leg: Secondary | ICD-10-CM

## 2015-08-11 DIAGNOSIS — R609 Edema, unspecified: Secondary | ICD-10-CM

## 2015-08-12 ENCOUNTER — Ambulatory Visit
Admission: RE | Admit: 2015-08-12 | Discharge: 2015-08-12 | Disposition: A | Discharge status: Home / Self Care | Payer: Medicare Other | Source: Ambulatory Visit | Attending: Family Medicine | Admitting: Family Medicine

## 2015-08-12 DIAGNOSIS — M79604 Pain in right leg: Secondary | ICD-10-CM

## 2015-08-12 DIAGNOSIS — R609 Edema, unspecified: Secondary | ICD-10-CM

## 2016-01-28 ENCOUNTER — Ambulatory Visit (INDEPENDENT_AMBULATORY_CARE_PROVIDER_SITE_OTHER): Payer: Medicare Other | Admitting: Internal Medicine

## 2016-01-28 ENCOUNTER — Encounter: Payer: Self-pay | Admitting: Internal Medicine

## 2016-01-28 VITALS — BP 147/85 | HR 87 | Temp 98.1°F | Ht 65.0 in | Wt 172.0 lb

## 2016-01-28 DIAGNOSIS — L03115 Cellulitis of right lower limb: Secondary | ICD-10-CM | POA: Diagnosis present

## 2016-01-28 MED ORDER — MUPIROCIN 2 % EX OINT: TOPICAL_OINTMENT | CUTANEOUS | 0 refills | Status: DC

## 2016-01-28 MED ORDER — CHLORHEXIDINE GLUCONATE 4 % EX LIQD: Freq: Every day | CUTANEOUS | 1 refills | Status: DC | PRN

## 2016-01-28 NOTE — Progress Notes (Signed)
Patient ID: Brenda Pham, female   DOB: Jul 03, 1940, 75 y.o.   MRN: [id number]  RFV: new community consultation, for chronic cellulitis  HPI Brenda Pham reports having recurrent lower extremity cellulitis that is also associated with thei swelling to her right lower leg. Patient reports symptoms starts initially with  a red rash to the right lower extremity with associated swelling. She notes some baseline swelling and she's had a knee replacement performed on 07/07/2015. Notes that the swelling is over and above what normally resides, developing pain to the lower extremity. Patient has been treated at least 2 x for cellulitis this year, has had ultrasound to rule out dvt. She reports she is able to eat and drink without difficulty, she denies any chest pain, shortness of breath, swelling to the opposite leg or upper extremity is. She denies any rash anywhere else other than the affected leg.   Outpatient Encounter Prescriptions as of 01/28/2016  Medication Sig  . atorvastatin (LIPITOR) 20 MG tablet Take 20 mg by mouth at bedtime.  Marland Kitchen amoxicillin-clavulanate (AUGMENTIN) 875-125 MG tablet Take 1 tablet by mouth 2 (two) times daily. (Patient not taking: Reported on 01/28/2016)  . furosemide (LASIX) 80 MG tablet Take 40 mg by mouth daily as needed for fluid or edema.  . [DISCONTINUED] docusate sodium (COLACE) 100 MG capsule Take 1 capsule (100 mg total) by mouth 2 (two) times daily. (Patient not taking: Reported on 06/06/2015)  . [DISCONTINUED] ferrous sulfate 325 (65 FE) MG tablet Take 1 tablet (325 mg total) by mouth 3 (three) times daily after meals. (Patient not taking: Reported on 06/06/2015)  . [DISCONTINUED] HYDROcodone-acetaminophen (NORCO) 7.5-325 MG per tablet Take 1-2 tablets by mouth every 4 (four) hours as needed for moderate pain. (Patient not taking: Reported on 06/06/2015)  . [DISCONTINUED] Polyethyl Glycol-Propyl Glycol (SYSTANE OP) Apply 1 drop to eye 3 (three) times daily as needed  (Dry eyes).  . [DISCONTINUED] polyethylene glycol (MIRALAX / GLYCOLAX) packet Take 17 g by mouth 2 (two) times daily. (Patient not taking: Reported on 06/06/2015)  . [DISCONTINUED] tiZANidine (ZANAFLEX) 4 MG tablet Take 1 tablet (4 mg total) by mouth every 6 (six) hours as needed for muscle spasms. (Patient not taking: Reported on 06/06/2015)   No facility-administered encounter medications on file as of 01/28/2016.      Patient Active Problem List   Diagnosis Date Noted  . Overweight (BMI 25.0-29.9) 07/09/2014  . 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 TETANUS/TDAP  04/28/1959  . COLONOSCOPY  04/27/1990  . ZOSTAVAX  04/27/2000  . DEXA SCAN  04/27/2005  . PNA vac Low Risk Adult (1 of 2 - PCV13) 04/27/2005  . INFLUENZA VACCINE  12/01/2015    Social History  Substance Use Topics  . Smoking status: Former Smoker    Packs/day: 1.00    Types: Cigarettes    Quit date: 07/02/1978  . Smokeless tobacco: Not on file  . Alcohol use No  family history is not on file.  Review of Systems Review of Systems  Constitutional: Negative for fever, chills, diaphoresis, activity change, appetite change, fatigue and unexpected weight change.  HENT: Negative for congestion, sore throat, rhinorrhea, sneezing, trouble swallowing and sinus pressure.  Eyes: Negative for photophobia and visual disturbance.  Respiratory: Negative for cough, chest tightness, shortness of breath, wheezing and stridor.  Cardiovascular: Negative for chest pain, palpitations and leg swelling.  Gastrointestinal: Negative for nausea, vomiting, abdominal pain, diarrhea, constipation, blood  in stool, abdominal distention and anal bleeding.  Genitourinary: Negative for dysuria, hematuria, flank pain and difficulty urinating.  Musculoskeletal: Negative for myalgias, back pain, joint swelling, arthralgias and gait problem.  Skin: Negative for color change, pallor, rash and wound.  Neurological:  Negative for dizziness, tremors, weakness and light-headedness.  Hematological: Negative for adenopathy. Does not bruise/bleed easily.  Psychiatric/Behavioral: Negative for behavioral problems, confusion, sleep disturbance, dysphoric mood, decreased concentration and agitation.    Physical Exam   BP (!) 147/85   Pulse 87   Temp 98.1 F (36.7 C) (Oral)   Ht 5\' 5"  (1.651 m)   Wt 78 kg (172 lb)   BMI 28.62 kg/m  Physical Exam  Constitutional:  oriented to person, place, and time. appears well-developed and well-nourished. No distress.  HENT: [place]/AT, PERRLA, no scleral icterus Mouth/Throat: Oropharynx is clear and moist. No oropharyngeal exudate.  Cardiovascular: Normal rate, regular rhythm and normal heart sounds. Exam reveals no gallop and no friction rub.  No murmur heard.  Pulmonary/Chest: Effort normal and breath sounds normal. No respiratory distress.  has no wheezes.  Neck = supple, no nuchal rigidity Abdominal: Soft. Bowel sounds are normal.  exhibits no distension. There is no tenderness.  Lymphadenopathy: no cervical adenopathy. No axillary adenopathy Neurological: alert and oriented to person, place, and time.  Skin: Skin is warm and dry.hyperpigmentation to right leg, Ext: slight increase in size of right leg/calf than left Psychiatric: a normal mood and affect.  behavior is normal.   CBC Lab Results  Component Value Date   WBC 8.5 06/06/2015   RBC 4.60 06/06/2015   HGB 13.3 06/06/2015   HCT 40.4 06/06/2015   PLT 129 (L) 06/06/2015   MCV 87.8 06/06/2015   MCH 28.9 06/06/2015   MCHC 32.9 06/06/2015   RDW 13.7 06/06/2015   LYMPHSABS 1.0 06/06/2015   MONOABS 0.5 06/06/2015   EOSABS 0.2 06/06/2015   BASOSABS 0.0 06/06/2015   BMET Lab Results  Component Value Date   NA 140 06/06/2015   K 4.0 06/06/2015   CL 104 06/06/2015   CO2 23 06/06/2015   GLUCOSE 181 (H) 06/06/2015   BUN 16 06/06/2015   CREATININE 0.66 06/06/2015   CALCIUM 10.3 06/06/2015   GFRNONAA  >60 06/06/2015   GFRAA >60 06/06/2015   I have reviewed her records  Assessment and Plan  Recurrent cellulitis but now has hyperpigmentation and slight lymphadema to right leg  - recommend to get abi to see if any restrictive flow - for now no need for further abtx - I have counseled the patient that sometimes patient have residual swelling of leg and hyperpigmentation after getting a serious case of cellulitis - no need for chronic suppression at this point either  Ashleigh A[facility]a [facility] [place] [facility] [phone]

## 2016-01-29 ENCOUNTER — Telehealth: Payer: Self-pay | Admitting: *Deleted

## 2016-01-29 NOTE — Telephone Encounter (Signed)
Patient's daughter notified of appt at [facility] for 02/02/16 at 9:45 AM. Myrtis Hopping

## 2016-01-30 LAB — MRSA CULTURE

## 2016-02-02 ENCOUNTER — Encounter (HOSPITAL_COMMUNITY): Payer: Medicare Other

## 2016-02-05 ENCOUNTER — Ambulatory Visit (HOSPITAL_COMMUNITY)
Admission: RE | Admit: 2016-02-05 | Discharge: 2016-02-05 | Disposition: A | Discharge status: Home / Self Care | Payer: Medicare Other | Source: Ambulatory Visit | Attending: Internal Medicine | Admitting: Internal Medicine

## 2016-02-05 DIAGNOSIS — L03115 Cellulitis of right lower limb: Secondary | ICD-10-CM | POA: Diagnosis present

## 2016-02-05 DIAGNOSIS — E785 Hyperlipidemia, unspecified: Secondary | ICD-10-CM | POA: Diagnosis not present

## 2016-02-05 DIAGNOSIS — E119 Type 2 diabetes mellitus without complications: Secondary | ICD-10-CM | POA: Insufficient documentation

## 2016-02-05 NOTE — Progress Notes (Signed)
VASCULAR LAB PRELIMINARY  ARTERIAL  ABI completed:    RIGHT    LEFT    PRESSURE WAVEFORM  PRESSURE WAVEFORM  BRACHIAL 140 Triphasic BRACHIAL 135 Triphasic  DP 153 Triphasic DP 133 Triphasic  PT 145 Triphasic PT 152 Triphasic    RIGHT LEFT  ABI 1.09 1.09   ABIs and Doppler waveforms are within normal limits at rest.  Brenda Pham, RVS 02/05/2016, 2:58 PM

## 2016-02-25 ENCOUNTER — Ambulatory Visit (INDEPENDENT_AMBULATORY_CARE_PROVIDER_SITE_OTHER): Payer: Medicare Other | Admitting: Internal Medicine

## 2016-02-25 ENCOUNTER — Encounter: Payer: Self-pay | Admitting: Internal Medicine

## 2016-02-25 VITALS — BP 150/83 | HR 90 | Temp 97.1°F | Wt 173.0 lb

## 2016-02-25 DIAGNOSIS — Z881 Allergy status to other antibiotic agents status: Secondary | ICD-10-CM

## 2016-02-25 DIAGNOSIS — I890 Lymphedema, not elsewhere classified: Secondary | ICD-10-CM | POA: Diagnosis not present

## 2016-02-25 NOTE — Progress Notes (Signed)
RFV: follow up for recurrent cellulitis  Patient ID: Brenda Pham, female   DOB: 25-Feb-1941, 75 y.o.   MRN: [id number]  HPI Brenda Pham is a pleasant 75yo F with previous history of right total knee replacement who has history of having lower extremity cellulitis to her right and left leg, not concurrently. She notices that after her most recent bout of cellulitis, her right leg has remained hyperpigmented, "tan appearing" in comparison to the left leg. She does have trace to +1 pitting edema to her right leg which she has had since her right knee replacement. She also has some edema around her left ankle after having foot surgery. At our last visit, we arranged for abi to see if she has any vascular insufficiency to have another explanation for her lower extremity edema, which the study did not show any significant reduction in flow. She has not had further episodes of cellulitis since we last saw her.   Outpatient Encounter Prescriptions as of 02/25/2016  Medication Sig  . atorvastatin (LIPITOR) 20 MG tablet Take 20 mg by mouth at bedtime.  . furosemide (LASIX) 80 MG tablet Take 40 mg by mouth daily as needed for fluid or edema.  . chlorhexidine (HIBICLENS) 4 % external liquid Apply topically daily as needed. Apply from neck to toes daily wash x 10 days (Patient not taking: Reported on 02/25/2016)  . mupirocin ointment (BACTROBAN) 2 % Apply to affected area 3 times daily x 10 days (Patient not taking: Reported on 02/25/2016)  . [DISCONTINUED] amoxicillin-clavulanate (AUGMENTIN) 875-125 MG tablet Take 1 tablet by mouth 2 (two) times daily. (Patient not taking: Reported on 01/28/2016)   No facility-administered encounter medications on file as of 02/25/2016.      Patient Active Problem List   Diagnosis Date Noted  . Overweight (BMI 25.0-29.9) 07/09/2014  . 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 TETANUS/TDAP  04/28/1959  . COLONOSCOPY   04/27/1990  . ZOSTAVAX  04/27/2000  . DEXA SCAN  04/27/2005  . PNA vac Low Risk Adult (1 of 2 - PCV13) 04/27/2005  . INFLUENZA VACCINE  12/01/2015     Review of Systems  Physical Exam   BP (!) 150/83   Pulse 90   Temp 97.1 F (36.2 C) (Oral)   Wt 173 lb (78.5 kg)   BMI 28.79 kg/m   Physical Exam  Constitutional:  oriented to person, place, and time. appears well-developed and well-nourished. No distress.  HENT: [place]/AT, PERRLA, no scleral icterus Mouth/Throat: Oropharynx is clear and moist. No oropharyngeal exudate.  Ext: +1 edema. Hyperpigmented on right lower extremity no breaks in skin Skin: Skin is warm and dry. No rash noted. No erythema.  Psychiatric: a normal mood and affect.  behavior is normal.   CBC Lab Results  Component Value Date   WBC 8.5 06/06/2015   RBC 4.60 06/06/2015   HGB 13.3 06/06/2015   HCT 40.4 06/06/2015   PLT 129 (L) 06/06/2015   MCV 87.8 06/06/2015   MCH 28.9 06/06/2015   MCHC 32.9 06/06/2015   RDW 13.7 06/06/2015   LYMPHSABS 1.0 06/06/2015   MONOABS 0.5 06/06/2015   EOSABS 0.2 06/06/2015   BASOSABS 0.0 06/06/2015   BMET Lab Results  Component Value Date   NA 140 06/06/2015   K 4.0 06/06/2015   CL 104 06/06/2015   CO2 23 06/06/2015   GLUCOSE 181 (H) 06/06/2015   BUN 16 06/06/2015   CREATININE 0.66 06/06/2015  CALCIUM 10.3 06/06/2015   GFRNONAA >60 06/06/2015   GFRAA >60 06/06/2015     Assessment and Plan  lymphadema to right leg = recommend to use compression stockings  Hx of abtx allergy = on one of her previous trials of abtx, she was treated with bactrim plus keflex and developed a rash. It was not determined which seh could have been allergic to. IT would be beneficial to determine if she can tolerate cephalosporins for future need.  will refer to allergist for skin testing to clarify if she can take cephalosporins and penicillins and sulfa  No need for chronic suppression at this time, no recurrence of  cellulitis

## 2016-03-28 ENCOUNTER — Encounter (INDEPENDENT_AMBULATORY_CARE_PROVIDER_SITE_OTHER): Payer: Self-pay

## 2016-03-28 ENCOUNTER — Encounter: Payer: Self-pay | Admitting: Allergy and Immunology

## 2016-03-28 ENCOUNTER — Ambulatory Visit (INDEPENDENT_AMBULATORY_CARE_PROVIDER_SITE_OTHER): Payer: Medicare Other | Admitting: Allergy and Immunology

## 2016-03-28 DIAGNOSIS — Z889 Allergy status to unspecified drugs, medicaments and biological substances status: Secondary | ICD-10-CM | POA: Diagnosis not present

## 2016-03-28 NOTE — Patient Instructions (Addendum)
Drug allergy Ms. Parlato's history strongly suggests adverse reaction to cephalexin or sulfamethoxazole, however it is unclear which was the culprit as she developed symptoms shortly after having taken both medications. We were unable to perform skin tests today due to recent administration of antihistamine.   The patient is scheduled to return next week for PrePen and PenG skin testing after having been off of antihistamines for at least 3 days.  If the penicillin skin tests are negative, we will proceed with open graded challenge.   Return in about 1 week (around 04/04/2016) for PCN testing.

## 2016-03-28 NOTE — Progress Notes (Signed)
New Patient Note  RE: Brenda Pham MRN: [id number] DOB: 02-10-1941 Date of Office Visit: 03/28/2016  Referring provider: Carlyle Basques, MD Primary care provider: Leonard Downing, MD  Chief Complaint: Allergic Reaction (drug reaction)   History of present illness: Brenda Pham is a 75 y.o. female seen today in consultation requested by Carlyle Basques, MD. She is accompanied today by her daughter who assists with a history.  She apparently had 3 episodes of cellulitis which occurred in February, June, and July 2017.  During the most recent episode, she was prescribed cephalexin and sulfamethoxazole/trimethoprim.  Brenda Pham had taken both these medications in the past without adverse symptoms, however on this occasion within an hour taking the first dose of these medications she developed severe generalized pruritus as well as an erythematous rash on her scalp, neck, chest, arms, and legs.  She discontinued these antibiotics after the first dose and the symptoms resolved over the course of a week.  She denies blister/bullae development.  She will require amoxicillin next month for dental procedure.  Aside from the 3 episodes of cellulitis, she denies other recurrent infections including urinary tract infections, GI infections, and respiratory tract infections.   Assessment and plan: Drug allergy Brenda Pham's history strongly suggests adverse reaction to cephalexin or sulfamethoxazole, however it is unclear which was the culprit as she developed symptoms shortly after having taken both medications. We were unable to perform skin tests today due to recent administration of antihistamine.   The patient is scheduled to return next week for PrePen and PenG skin testing after having been off of antihistamines for at least 3 days.  If the penicillin skin tests are negative, we will proceed with open graded challenge.   Diagnostics: We were unable to perform skin tests today  due to recent administration of antihistamine.     Physical examination: Blood pressure 138/70, pulse 76, resp. rate 16, height 5\' 4"  (1.626 m), weight 172 lb 9.6 oz (78.3 kg).  General: Alert, interactive, in no acute distress. Neck: Supple without lymphadenopathy. Lungs: Clear to auscultation without wheezing, rhonchi or rales. CV: Normal S1, S2 without murmurs. Abdomen: Nondistended, nontender. Skin: Warm and dry, without lesions or rashes. Extremities:  No clubbing, cyanosis or edema. Neuro:   Grossly intact.  Review of systems:  Review of systems negative except as noted in HPI / PMHx or noted below: Review of Systems  Constitutional: Negative.   HENT: Negative.   Eyes: Negative.   Respiratory: Negative.   Cardiovascular: Negative.   Gastrointestinal: Negative.   Genitourinary: Negative.   Musculoskeletal: Negative.   Skin: Negative.   Neurological: Negative.   Endo/Heme/Allergies: Negative.   Psychiatric/Behavioral: Negative.     Past medical history:  Past Medical History:  Diagnosis Date  . Arthritis    osteoarthritis -knees, hands  . Cancer [facility])    '80-right breast, surgery and radiation.  . Diabetes mellitus without complication ([place])    recently dx. no oral meds or insulin.  Marland Kitchen Hyperlipidemia   . PONV (postoperative nausea and vomiting)     Past surgical history:  Past Surgical History:  Procedure Laterality Date  . APPENDECTOMY    . BREAST SURGERY     Rt. breast modified mastectomy/(reconstuction)-last '03 tranflap(tissue from abdomen); and left subcutaneoues mastectomy and implant left breast-last implant '02.  Marland Kitchen CATARACT EXTRACTION, BILATERAL     last 06-11-14 left  . DILATION AND CURETTAGE OF UTERUS    . FINGER SURGERY Right    right ring finger-excsion  cyst and nodules.  Marland Kitchen FOOT SURGERY Left    '10- 2nd toe   . KNEE ARTHROSCOPY Right   . KNEE ARTHROSCOPY Left    scope with hammer toe, '06- scope for torn cartilage  . TOTAL KNEE ARTHROPLASTY  Right 07/07/2014   Procedure: RIGHT TOTAL KNEE ARTHROPLASTY;  Surgeon: Paralee Cancel, MD;  Location: [facility];  Service: Orthopedics;  Laterality: Right;    Family history: Family History  Problem Relation Age of Onset  . Allergic rhinitis Daughter     Social history: Social History   Social History  . Marital status: Married    Spouse name: N/A  . Number of children: N/A  . Years of education: N/A   Occupational History  . Not on file.   Social History Main Topics  . Smoking status: Former Smoker    Packs/day: 1.00    Types: Cigarettes    Quit date: 07/02/1978  . Smokeless tobacco: Never Used  . Alcohol use No  . Drug use: No  . Sexual activity: Not Currently   Other Topics Concern  . Not on file   Social History Narrative  . No narrative on file   Environmental History: The patient lives in a 76 year old house with carpeting throughout and central air/heat.  She has no pets.  She is a former cigarette smoker having smoked for 1959 to 1980 with a 21-pack-year history.    Medication List       Accurate as of 03/28/16  5:40 PM. Always use your most recent med list.          atorvastatin 20 MG tablet Commonly known as:  LIPITOR Take 20 mg by mouth at bedtime.   chlorhexidine 4 % external liquid Commonly known as:  HIBICLENS Apply topically daily as needed. Apply from neck to toes daily wash x 10 days   furosemide 80 MG tablet Commonly known as:  LASIX Take 40 mg by mouth daily as needed for fluid or edema.   mupirocin ointment 2 % Commonly known as:  BACTROBAN Apply to affected area 3 times daily x 10 days       Known medication allergies: Allergies  Allergen Reactions  . Niacin And Related     Extreme flushing, heart racing  . Other     "Clindamycin"-Antibiotic caused thrush.     I appreciate the opportunity to take part in Tiersa's care. Please do not hesitate to contact me with questions.  Sincerely,   R. Edgar Frisk, MD

## 2016-03-28 NOTE — Assessment & Plan Note (Addendum)
Brenda Pham history strongly suggests adverse reaction to cephalexin or sulfamethoxazole, however it is unclear which was the culprit as she developed symptoms shortly after having taken both medications. We were unable to perform skin tests today due to recent administration of antihistamine.   The patient is scheduled to return next week for PrePen and PenG skin testing after having been off of antihistamines for at least 3 days.  If the penicillin skin tests are negative, we will proceed with open graded challenge.

## 2016-04-04 ENCOUNTER — Encounter: Payer: Self-pay | Admitting: Allergy and Immunology

## 2016-04-04 ENCOUNTER — Ambulatory Visit (INDEPENDENT_AMBULATORY_CARE_PROVIDER_SITE_OTHER): Payer: Medicare Other | Admitting: Allergy and Immunology

## 2016-04-04 VITALS — BP 118/72 | HR 91 | Temp 97.6°F | Resp 16 | Wt 174.0 lb

## 2016-04-04 DIAGNOSIS — T7840XD Allergy, unspecified, subsequent encounter: Secondary | ICD-10-CM | POA: Diagnosis not present

## 2016-04-04 DIAGNOSIS — T50995D Adverse effect of other drugs, medicaments and biological substances, subsequent encounter: Secondary | ICD-10-CM

## 2016-04-04 DIAGNOSIS — Z889 Allergy status to unspecified drugs, medicaments and biological substances status: Secondary | ICD-10-CM

## 2016-04-04 DIAGNOSIS — T7840XA Allergy, unspecified, initial encounter: Secondary | ICD-10-CM | POA: Insufficient documentation

## 2016-04-04 NOTE — Patient Instructions (Addendum)
Drug allergy Pre-Pen and pen G skin tests were negative at all dilutions despite a positive histamine control.  As she has a dental appointment requiring amoxicillin in a few weeks, we will proceed with an amoxicillin open graded challenge next week.  If this is negative, we will move on to a cephalexin oral challenge.  If these challenges are negative, it can be assumed that sulfamethoxazole/trimethoprim triggered the allergic reaction and she will continue avoidance of sulfonamide anabiotics.     Return in about 1 week (around 04/11/2016) for amoxicillin open challenge.

## 2016-04-04 NOTE — Progress Notes (Signed)
    Follow-up Note  RE: Brenda Pham MRN: [id number] DOB: Mar 20, 1941 Date of Office Visit: 04/04/2016  Primary care provider: Leonard Downing, MD Referring provider: Leonard Downing, *  History of present illness: Brenda Pham is a 75 y.o. female presenting today for penicillin skin testing.  She has a history of allergic reaction presumed to be secondary to cephalexin and/or sulfamethoxazole/trimethoprim drug allergy.  Please see history of present illness from 03/28/16 for details.   Assessment and plan: Drug allergy Pre-Pen and pen G skin tests were negative at all dilutions despite a positive histamine control.  As she has a dental appointment requiring amoxicillin in a few weeks, we will proceed with an amoxicillin open graded challenge next week.  If this is negative, we will move on to a cephalexin oral challenge.  If these challenges are negative, it can be assumed that sulfamethoxazole/trimethoprim triggered the allergic reaction and she will continue avoidance of sulfonamide anabiotics.     Diagnostics: Pre-Pen and pen G skin testing: Negative at all dilutions despite a positive histamine control.    Physical examination: Blood pressure 118/72, pulse 91, temperature 97.6 F (36.4 C), temperature source Oral, resp. rate 16, weight 174 lb (78.9 kg), SpO2 95 %.  General: Alert, interactive, in no acute distress. Neck: Supple without lymphadenopathy. Lungs: Clear to auscultation without wheezing, rhonchi or rales. CV: Normal S1, S2 without murmurs. Skin: Warm and dry, without lesions or rashes.  The following portions of the patient's history were reviewed and updated as appropriate: allergies, current medications, past family history, past medical history, past social history, past surgical history and problem list.    Medication List       Accurate as of 04/04/16  2:17 PM. Always use your most recent med list.          atorvastatin 20 MG  tablet Commonly known as:  LIPITOR Take 20 mg by mouth at bedtime.   furosemide 80 MG tablet Commonly known as:  LASIX Take 40 mg by mouth daily as needed for fluid or edema.       Allergies  Allergen Reactions  . Niacin And Related     Extreme flushing, heart racing  . Other     "Clindamycin"-Antibiotic caused thrush.     I appreciate the opportunity to take part in Tricia's care. Please do not hesitate to contact me with questions.  Sincerely,   R. Edgar Frisk, MD

## 2016-04-04 NOTE — Assessment & Plan Note (Signed)
Pre-Pen and pen G skin tests were negative at all dilutions despite a positive histamine control.  As she has a dental appointment requiring amoxicillin in a few weeks, we will proceed with an amoxicillin open graded challenge next week.  If this is negative, we will move on to a cephalexin oral challenge.  If these challenges are negative, it can be assumed that sulfamethoxazole/trimethoprim triggered the allergic reaction and she will continue avoidance of sulfonamide anabiotics.

## 2016-04-11 ENCOUNTER — Encounter: Payer: Self-pay | Admitting: Allergy and Immunology

## 2016-04-11 ENCOUNTER — Ambulatory Visit (INDEPENDENT_AMBULATORY_CARE_PROVIDER_SITE_OTHER): Payer: Medicare Other | Admitting: Allergy and Immunology

## 2016-04-11 VITALS — BP 134/82 | HR 80 | Temp 98.2°F | Resp 16

## 2016-04-11 DIAGNOSIS — Z889 Allergy status to unspecified drugs, medicaments and biological substances status: Secondary | ICD-10-CM

## 2016-04-11 NOTE — Patient Instructions (Addendum)
Drug allergy The patient had negative skin tests to PrePen and PenG and was able to tolerate the open graded challenge today without adverse signs or symptoms. Therefore, she has the same risk of systemic reaction associated with penicillin as the general population.  The allergic reaction occurred while taking both a first generation cephalosporin and sulfonamide antibiotic. The results today suggest that the sulfonamide antibiotic was the culprit.  Avoid sulfonamide based medications/antibiotics.   If a first or second generation cephalosporin is required, I have recommended an observed graded oral challenge prior to administration of this antibiotic.

## 2016-04-11 NOTE — Assessment & Plan Note (Addendum)
The patient had negative skin tests to PrePen and PenG and was able to tolerate the open graded challenge today without adverse signs or symptoms. Therefore, she has the same risk of systemic reaction associated with penicillin as the general population.  The allergic reaction occurred while taking both a first generation cephalosporin and sulfonamide antibiotic. The results today suggest that the sulfonamide antibiotic was the culprit.  Avoid sulfonamide based medications/antibiotics.   If a first or second generation cephalosporin is required, I have recommended an observed graded oral challenge prior to administration of this antibiotic.

## 2016-04-11 NOTE — Progress Notes (Signed)
    Follow-up Note  RE: NISHAT COMPRES MRN: [id number] DOB: 1940/12/28 Date of Office Visit: 04/11/2016  Primary care provider: Leonard Downing, MD Referring provider: Leonard Downing, *  History of present illness: Maeghen Gent is a 75 y.o. female presents today for penicillin open graded challenge. She recently had negative Pre-Pen and pen G skin testing.  We had considered proceeding with an open cephalexin challenge, however as Ms Doornbos needs prophylactic amoxicillin next week prior to a dental procedure, we will challenge to penicillin today.   Assessment and plan: Drug allergy The patient had negative skin tests to PrePen and PenG and was able to tolerate the open graded challenge today without adverse signs or symptoms. Therefore, she has the same risk of systemic reaction associated with penicillin as the general population.  The allergic reaction occurred while taking both a first generation cephalosporin and sulfonamide antibiotic. The results today suggest that the sulfonamide antibiotic was the culprit.  Avoid sulfonamide based medications/antibiotics.   If a first or second generation cephalosporin is required, I have recommended an observed graded oral challenge prior to administration of this antibiotic.   Diagnostics: Open graded penicillin V challenge: The patient was able to tolerate the challenge today without adverse signs or symptoms. Vital signs were stable throughout the challenge and observation period.     Physical examination: Blood pressure 134/82, pulse 80, temperature 98.2 F (36.8 C), temperature source Oral, resp. rate 16.  General: Alert, interactive, in no acute distress. Neck: Supple without lymphadenopathy. Lungs: Clear to auscultation without wheezing, rhonchi or rales. CV: Normal S1, S2 without murmurs. Skin: Warm and dry, without lesions or rashes.  The following portions of the patient's history were reviewed and  updated as appropriate: allergies, current medications, past family history, past medical history, past social history, past surgical history and problem list.    Medication List       Accurate as of 04/11/16  1:10 PM. Always use your most recent med list.          atorvastatin 20 MG tablet Commonly known as:  LIPITOR Take 20 mg by mouth at bedtime.   furosemide 80 MG tablet Commonly known as:  LASIX Take 40 mg by mouth daily as needed for fluid or edema.       Allergies  Allergen Reactions  . Sulfa Antibiotics Rash  . Niacin And Related     Extreme flushing, heart racing  . Other     "Clindamycin"-Antibiotic caused thrush.     I appreciate the opportunity to take part in Laine's care. Please do not hesitate to contact me with questions.  Sincerely,   R. Edgar Frisk, MD

## 2016-07-20 ENCOUNTER — Ambulatory Visit (INDEPENDENT_AMBULATORY_CARE_PROVIDER_SITE_OTHER): Payer: Medicare Other | Admitting: Internal Medicine

## 2016-07-20 ENCOUNTER — Encounter: Payer: Self-pay | Admitting: Internal Medicine

## 2016-07-20 VITALS — Ht 65.0 in | Wt 171.0 lb

## 2016-07-20 DIAGNOSIS — R600 Localized edema: Secondary | ICD-10-CM

## 2016-07-20 NOTE — Progress Notes (Signed)
   RFV: follow up for recurrent cellulitis  Patient ID: Brenda Pham, female   DOB: December 27, 1940, 76 y.o.   MRN: [id number]  HPI 76yo F with hx of right TKA and recurrent right leg cellulitis. She was last seen in our clinic in oct 2017. She does have residual lower extremity edema that have features of lymphedema. Also has hx of allergies to abtx, she developed rash to either keflex and bactrim the last time we saw her.  She states that she has been doing well since her last visit. No episodes of right leg swelling nor any cellulitis until now. She had noticed right leg swelling with some associated redness. No injury no rash. Appears slightly improved today. No fever, chills, or leg pain  She brought in a cup of her urine since she was concerned about the amber color. She denies any flank pain nor dysuria  Soc hx: her husband of 34yr died in 04/02/23  Outpatient Encounter Prescriptions as of 07/20/2016  Medication Sig  . atorvastatin (LIPITOR) 20 MG tablet Take 20 mg by mouth at bedtime.  . furosemide (LASIX) 80 MG tablet Take 40 mg by mouth daily as needed for fluid or edema.   No facility-administered encounter medications on file as of 07/20/2016.      Patient Active Problem List   Diagnosis Date Noted  . Allergic reaction 04/04/2016  . Drug allergy 03/28/2016  . Overweight (BMI 25.0-29.9) 07/09/2014  . 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 TETANUS/TDAP  04/28/1959  . DEXA SCAN  04/27/2005  . PNA vac Low Risk Adult (1 of 2 - PCV13) 04/27/2005  . INFLUENZA VACCINE  12/01/2015     Review of Systems 12 point ros is negative other than what is in hpi Physical Exam   Ht 5\' 5"  (1.651 m)   Wt 171 lb (77.6 kg)   BMI 28.46 kg/m  Physical Exam  Constitutional:  oriented to person, place, and time. appears well-developed and well-nourished. No distress.  HENT: [place]/AT, PERRLA, no scleral icterus Mouth/Throat: Oropharynx is clear  and moist. No oropharyngeal exudate.  Skin: Skin is warm and dry. Hyperpigmentation of feet and ankles bilaterally Ext:  +1 edema to right leg, pretibial 1/2 way up tibia. No blanching erythema Psychiatric: a normal mood and affect.  behavior is normal.   CBC Lab Results  Component Value Date   WBC 8.5 06/06/2015   RBC 4.60 06/06/2015   HGB 13.3 06/06/2015   HCT 40.4 06/06/2015   PLT 129 (L) 06/06/2015   MCV 87.8 06/06/2015   MCH 28.9 06/06/2015   MCHC 32.9 06/06/2015   RDW 13.7 06/06/2015   LYMPHSABS 1.0 06/06/2015   MONOABS 0.5 06/06/2015   EOSABS 0.2 06/06/2015    BMET Lab Results  Component Value Date   NA 140 06/06/2015   K 4.0 06/06/2015   CL 104 06/06/2015   CO2 23 06/06/2015   GLUCOSE 181 (H) 06/06/2015   BUN 16 06/06/2015   CREATININE 0.66 06/06/2015   CALCIUM 10.3 06/06/2015   GFRNONAA >60 06/06/2015   GFRAA >60 06/06/2015      Assessment and Plan  RIGHT Lower extremity edema =appears to be recurrent, no pain, just trace. Will have her do ultrasound if it still persists over the next day to rule out dvt. No signs of cellulitis. Does not need abtx at this time.  Recommend elevating leg, vs. Compression stocking

## 2016-08-11 ENCOUNTER — Telehealth: Payer: Self-pay | Admitting: *Deleted

## 2016-08-11 NOTE — Telephone Encounter (Signed)
Patient called and advised she has received a call from [facility] about her ultrasound of her leg. She advised it is no longer swollen and does not want to have the test. She feels it has been long enough that if it was a DVT she would know by now. Advised her will let the doctor know and call [facility] to cancel the procedure. Advised her to keep an eye on her leg and call us if anything change.

## 2017-08-02 DIAGNOSIS — E785 Hyperlipidemia, unspecified: Secondary | ICD-10-CM | POA: Diagnosis present

## 2017-08-02 DIAGNOSIS — K648 Other hemorrhoids: Secondary | ICD-10-CM | POA: Insufficient documentation

## 2017-08-07 ENCOUNTER — Other Ambulatory Visit: Payer: Self-pay | Admitting: Family Medicine

## 2017-08-07 ENCOUNTER — Ambulatory Visit
Admission: RE | Admit: 2017-08-07 | Discharge: 2017-08-07 | Disposition: A | Discharge status: Home / Self Care | Payer: Medicare Other | Source: Ambulatory Visit | Attending: Family Medicine | Admitting: Family Medicine

## 2017-08-07 DIAGNOSIS — Z853 Personal history of malignant neoplasm of breast: Secondary | ICD-10-CM

## 2017-08-08 ENCOUNTER — Other Ambulatory Visit: Payer: Self-pay | Admitting: Family Medicine

## 2017-08-08 DIAGNOSIS — R911 Solitary pulmonary nodule: Secondary | ICD-10-CM

## 2017-08-09 ENCOUNTER — Other Ambulatory Visit: Payer: Medicare Other

## 2017-08-09 ENCOUNTER — Ambulatory Visit
Admission: RE | Admit: 2017-08-09 | Discharge: 2017-08-09 | Disposition: A | Discharge status: Home / Self Care | Payer: Medicare Other | Source: Ambulatory Visit | Attending: Family Medicine | Admitting: Family Medicine

## 2017-08-09 DIAGNOSIS — R911 Solitary pulmonary nodule: Secondary | ICD-10-CM

## 2017-08-10 ENCOUNTER — Other Ambulatory Visit (HOSPITAL_COMMUNITY): Payer: Self-pay | Admitting: Family Medicine

## 2017-08-10 DIAGNOSIS — R911 Solitary pulmonary nodule: Secondary | ICD-10-CM

## 2017-08-14 ENCOUNTER — Other Ambulatory Visit: Payer: Medicare Other

## 2017-08-14 ENCOUNTER — Encounter
Admission: RE | Admit: 2017-08-14 | Discharge: 2017-08-14 | Disposition: A | Discharge status: Home / Self Care | Payer: Medicare Other | Source: Ambulatory Visit | Attending: Family Medicine | Admitting: Family Medicine

## 2017-08-14 DIAGNOSIS — R911 Solitary pulmonary nodule: Secondary | ICD-10-CM | POA: Diagnosis not present

## 2017-08-14 LAB — GLUCOSE, CAPILLARY: Glucose-Capillary: 124 mg/dL — ABNORMAL HIGH (ref 65–99)

## 2017-08-14 MED ORDER — FLUDEOXYGLUCOSE F - 18 (FDG) INJECTION
9.0300 | Freq: Once | INTRAVENOUS | Status: AC | PRN
  Administered 2017-08-14: 9.03 via INTRAVENOUS

## 2017-08-17 ENCOUNTER — Ambulatory Visit (HOSPITAL_COMMUNITY): Payer: Medicare Other

## 2017-08-30 DIAGNOSIS — Z86010 Personal history of colonic polyps: Secondary | ICD-10-CM | POA: Insufficient documentation

## 2017-09-08 ENCOUNTER — Ambulatory Visit (INDEPENDENT_AMBULATORY_CARE_PROVIDER_SITE_OTHER): Payer: Medicare Other | Admitting: Pulmonary Disease

## 2017-09-08 ENCOUNTER — Encounter: Payer: Self-pay | Admitting: Pulmonary Disease

## 2017-09-08 ENCOUNTER — Other Ambulatory Visit: Payer: Medicare Other

## 2017-09-08 VITALS — BP 124/72 | HR 90 | Ht 65.0 in | Wt 162.6 lb

## 2017-09-08 DIAGNOSIS — R918 Other nonspecific abnormal finding of lung field: Secondary | ICD-10-CM

## 2017-09-08 DIAGNOSIS — M1990 Unspecified osteoarthritis, unspecified site: Secondary | ICD-10-CM

## 2017-09-08 NOTE — Patient Instructions (Signed)
We will check some blood work today including ANA, rheumatoid factor, CCP We will get evaluated by rheumatologist for evaluation of your arthritis.  I suspect he may have rheumatoid arthritis Regarding your lung nodule will hold off on the biopsy until we can clarify this I will follow-up with you in 1 to 2 months.

## 2017-09-08 NOTE — Progress Notes (Signed)
Brenda Pham    [id number]    May 04, 1940  Primary Care Physician:Elkins, Curt Jews, MD  Referring Physician: Leonard Downing, MD [address] [place], [place] [address]  Chief complaint: Evaluation for lung nodules  HPI: 77 year old with history of breast cancer, arthritis, diabetes She had a CT scan and PET scan which showed multiple lung nodules with lobe nodule and has been referred here for further evaluation Has occasional nonproductive cough.  Denies any sputum, weight loss, hemoptysis Complains of joint pain and stiffness in the small joints of the hand that has been progressively getting worse.   History of breast cancer diagnosed in 1980s status post radical mastectomy and breast reconstruction.  There is no evidence of recurrence and since then  Pets: No pets Occupation: Retired Network engineer Exposures: No known exposure, no mold, hot tubs, Jacuzzi Smoking history: 10-pack-year smoking history.  Quit in 1980 Travel history: Lived in [place] all her life.  No significant travel Relevant family history: No significant family history of lung issues  Outpatient Encounter Medications as of 09/08/2017  Medication Sig  . atorvastatin (LIPITOR) 20 MG tablet Take 20 mg by mouth at bedtime.  . naproxen sodium (ALEVE) 220 MG tablet Take 220 mg by mouth.  . [DISCONTINUED] furosemide (LASIX) 80 MG tablet Take 40 mg by mouth daily as needed for fluid or edema.  . [DISCONTINUED] tobramycin-dexamethasone (TOBRADEX) ophthalmic solution Instill one drop OD QID x 1 week   No facility-administered encounter medications on file as of 09/08/2017.     Allergies as of 09/08/2017 - Review Complete 09/08/2017  Allergen Reaction Noted  . Sulfa antibiotics Rash 04/11/2016  . Niacin and related  07/02/2014  . Other  06/25/2014    Past Medical History:  Diagnosis Date  . Arthritis    osteoarthritis -knees, hands  . Cancer [facility])    '80-right breast,  surgery and radiation.  . Diabetes mellitus without complication ([place])    recently dx. no oral meds or insulin.  Marland Kitchen Hyperlipidemia   . PONV (postoperative nausea and vomiting)     Past Surgical History:  Procedure Laterality Date  . APPENDECTOMY    . BREAST SURGERY     Rt. breast modified mastectomy/(reconstuction)-last '03 tranflap(tissue from abdomen); and left subcutaneoues mastectomy and implant left breast-last implant '02.  Marland Kitchen CATARACT EXTRACTION, BILATERAL     last 06-11-14 left  . DILATION AND CURETTAGE OF UTERUS    . FINGER SURGERY Right    right ring finger-excsion cyst and nodules.  Marland Kitchen FOOT SURGERY Left    '10- 2nd toe   . KNEE ARTHROSCOPY Right   . KNEE ARTHROSCOPY Left    scope with hammer toe, '06- scope for torn cartilage  . TOTAL KNEE ARTHROPLASTY Right 07/07/2014   Procedure: RIGHT TOTAL KNEE ARTHROPLASTY;  Surgeon: Paralee Cancel, MD;  Location: [facility];  Service: Orthopedics;  Laterality: Right;    Family History  Problem Relation Age of Onset  . Allergic rhinitis Daughter   . Hypertension Mother   . Diabetes Mother   . Kidney disease Mother     Social History   Socioeconomic History  . Marital status: Married    Spouse name: Not on file  . Number of children: Not on file  . Years of education: Not on file  . Highest education level: Not on file  Occupational History  . Not on file  Social Needs  . Financial resource strain: Not on file  .  Food insecurity:    Worry: Not on file    Inability: Not on file  . Transportation needs:    Medical: Not on file    Non-medical: Not on file  Tobacco Use  . Smoking status: Former Smoker    Packs/day: 1.00    Years: 10.00    Pack years: 10.00    Types: Cigarettes    Last attempt to quit: 07/02/1978    Years since quitting: 39.2  . Smokeless tobacco: Never Used  Substance and Sexual Activity  . Alcohol use: No  . Drug use: No  . Sexual activity: Not Currently  Lifestyle  . Physical activity:    Days per  week: Not on file    Minutes per session: Not on file  . Stress: Not on file  Relationships  . Social connections:    Talks on phone: Not on file    Gets together: Not on file    Attends religious service: Not on file    Active member of club or organization: Not on file    Attends meetings of clubs or organizations: Not on file    Relationship status: Not on file  . Intimate partner violence:    Fear of current or ex partner: Not on file    Emotionally abused: Not on file    Physically abused: Not on file    Forced sexual activity: Not on file  Other Topics Concern  . Not on file  Social History Narrative  . Not on file    Review of systems: Review of Systems  Constitutional: Negative for fever and chills.  HENT: Negative.   Eyes: Negative for blurred vision.  Respiratory: as per HPI  Cardiovascular: Negative for chest pain and palpitations.  Gastrointestinal: Negative for vomiting, diarrhea, blood per rectum. Genitourinary: Negative for dysuria, urgency, frequency and hematuria.  Musculoskeletal: Negative for myalgias, back pain and joint pain.  Skin: Negative for itching and rash.  Neurological: Negative for dizziness, tremors, focal weakness, seizures and loss of consciousness.  Endo/Heme/Allergies: Negative for environmental allergies.  Psychiatric/Behavioral: Negative for depression, suicidal ideas and hallucinations.  All other systems reviewed and are negative.  Physical Exam: Blood pressure 124/72, pulse 90, height 5\' 5"  (1.651 m), weight 162 lb 9.6 oz (73.8 kg), SpO2 96 %. Gen:      No acute distress HEENT:  EOMI, sclera anicteric Neck:     No masses; no thyromegaly Lungs:    Clear to auscultation bilaterally; normal respiratory effort CV:         Regular rate and rhythm; no murmurs Abd:      + bowel sounds; soft, non-tender; no palpable masses, no distension Ext:    No edema; adequate peripheral perfusion Skin:      Warm and dry; no rash Neuro: alert and  oriented x 3 Psych: normal mood and affect  Data Reviewed: CT scan chest 08/07/2017- 16 mm right middle lobe nodule, multiple scattered subcentimeter pulmonary nodule.  Right middle lobe fibrosis PET scan 08/14/2017- 16 mm right middle lobe nodule has SUV of 2.8.  The other lung nodules are below the limits of detection by PET scan.  Nonspecific uptake in the left pectoral muscle. I have reviewed the images personally.  Assessment:  Multiple lung nodules I have reviewed the CT scan and PET scan.  The largest RML nodule has mild uptake which is certainly concerning for malignancy PET scan does not show any other uptake and she has minimal smoking history.  History also  noted for breast cancer with no recurrence since 1980.  She also have significant small joint arthritis with swelling of the interphalangeal joint which is concerning for rheumatoid arthritis. This can can present as lung nodules with mild PET uptake We will defer lung biopsy for now until she can be evaluated for rheumatoid arthritis I will get ANA, rheumatoid factor, CCP and refer her to a rheumatologist  If there is no evidence of RA and the lung nodule shows growth then we could consider biopsy  Plan/Recommendations: - Check ANA, rheumatoid factor, CCP - Referral to rheumatology. - Follow-up CT in 3 months.  Marshell Garfinkel MD [facility] 09/08/2017, 3:21 PM  CC: Leonard Downing, *

## 2017-09-11 LAB — ANA,IFA RA DIAG PNL W/RFLX TIT/PATN
Anti Nuclear Antibody(ANA): NEGATIVE
Cyclic Citrullin Peptide Ab: <16 UNITS
Rhuematoid fact SerPl-aCnc: <14 IU/mL (ref <14)

## 2017-10-25 ENCOUNTER — Encounter: Payer: Self-pay | Admitting: Pulmonary Disease

## 2017-10-25 ENCOUNTER — Ambulatory Visit (INDEPENDENT_AMBULATORY_CARE_PROVIDER_SITE_OTHER): Payer: Medicare Other | Admitting: Pulmonary Disease

## 2017-10-25 VITALS — BP 140/88 | HR 98 | Ht 65.0 in | Wt 163.0 lb

## 2017-10-25 DIAGNOSIS — R0602 Shortness of breath: Secondary | ICD-10-CM | POA: Diagnosis not present

## 2017-10-25 DIAGNOSIS — R918 Other nonspecific abnormal finding of lung field: Secondary | ICD-10-CM | POA: Diagnosis not present

## 2017-10-25 NOTE — Progress Notes (Signed)
Brenda Pham    [id number]    [birth year]  Primary Care Physician:Elkins, Curt Jews, MD  Referring Physician: Leonard Downing, MD [address] [place], [place] [address]  Chief complaint: Follow-up for lung nodules  HPI: 77 year old with history of breast cancer, arthritis, diabetes She had a CT scan and PET scan which showed multiple lung nodules with lobe nodule and has been referred here for further evaluation Has occasional nonproductive cough.  Denies any sputum, weight loss, hemoptysis Complains of joint pain and stiffness in the small joints of the hand that has been progressively getting worse.   History of breast cancer diagnosed in 1980s status post radical mastectomy and breast reconstruction.  There is no evidence of recurrence and since then  Pets: No pets Occupation: Retired Network engineer Exposures: No known exposure, no mold, hot tubs, Jacuzzi Smoking history: 10-pack-year smoking history.  Quit in 1980 Travel history: Lived in [place] all her life.  No significant travel Relevant family history: No significant family history of lung issues  Interim history: Here for follow-up.  She has been referred to rheumatology but the consult been delayed until July 17 No respiratory symptoms.  No cough, sputum production, wheezing, chills.  Outpatient Encounter Medications as of 10/25/2017  Medication Sig  . atorvastatin (LIPITOR) 20 MG tablet Take 20 mg by mouth at bedtime.  . naproxen sodium (ALEVE) 220 MG tablet Take 220 mg by mouth.   No facility-administered encounter medications on file as of 10/25/2017.     Allergies as of 10/25/2017 - Review Complete 10/25/2017  Allergen Reaction Noted  . Sulfa antibiotics Rash 04/11/2016  . Niacin and related  07/02/2014  . Other  06/25/2014    Past Medical History:  Diagnosis Date  . Arthritis    osteoarthritis -knees, hands  . Cancer [facility])    '80-right breast, surgery and radiation.   . Diabetes mellitus without complication ([place])    recently dx. no oral meds or insulin.  Marland Kitchen Hyperlipidemia   . PONV (postoperative nausea and vomiting)     Past Surgical History:  Procedure Laterality Date  . APPENDECTOMY    . BREAST SURGERY     Rt. breast modified mastectomy/(reconstuction)-last '03 tranflap(tissue from abdomen); and left subcutaneoues mastectomy and implant left breast-last implant '02.  Marland Kitchen CATARACT EXTRACTION, BILATERAL     last 06-11-14 left  . DILATION AND CURETTAGE OF UTERUS    . FINGER SURGERY Right    right ring finger-excsion cyst and nodules.  Marland Kitchen FOOT SURGERY Left    '10- 2nd toe   . KNEE ARTHROSCOPY Right   . KNEE ARTHROSCOPY Left    scope with hammer toe, '06- scope for torn cartilage  . TOTAL KNEE ARTHROPLASTY Right 07/07/2014   Procedure: RIGHT TOTAL KNEE ARTHROPLASTY;  Surgeon: Paralee Cancel, MD;  Location: [facility];  Service: Orthopedics;  Laterality: Right;    Family History  Problem Relation Age of Onset  . Allergic rhinitis Daughter   . Hypertension Mother   . Diabetes Mother   . Kidney disease Mother     Social History   Socioeconomic History  . Marital status: Married    Spouse name: Not on file  . Number of children: Not on file  . Years of education: Not on file  . Highest education level: Not on file  Occupational History  . Not on file  Social Needs  . Financial resource strain: Not on file  . Food insecurity:  Worry: Not on file    Inability: Not on file  . Transportation needs:    Medical: Not on file    Non-medical: Not on file  Tobacco Use  . Smoking status: Former Smoker    Packs/day: 1.00    Years: 10.00    Pack years: 10.00    Types: Cigarettes    Last attempt to quit: 07/02/1978    Years since quitting: 39.3  . Smokeless tobacco: Never Used  Substance and Sexual Activity  . Alcohol use: No  . Drug use: No  . Sexual activity: Not Currently  Lifestyle  . Physical activity:    Days per week: Not on file     Minutes per session: Not on file  . Stress: Not on file  Relationships  . Social connections:    Talks on phone: Not on file    Gets together: Not on file    Attends religious service: Not on file    Active member of club or organization: Not on file    Attends meetings of clubs or organizations: Not on file    Relationship status: Not on file  . Intimate partner violence:    Fear of current or ex partner: Not on file    Emotionally abused: Not on file    Physically abused: Not on file    Forced sexual activity: Not on file  Other Topics Concern  . Not on file  Social History Narrative  . Not on file    Review of systems: Review of Systems  Constitutional: Negative for fever and chills.  HENT: Negative.   Eyes: Negative for blurred vision.  Respiratory: as per HPI  Cardiovascular: Negative for chest pain and palpitations.  Gastrointestinal: Negative for vomiting, diarrhea, blood per rectum. Genitourinary: Negative for dysuria, urgency, frequency and hematuria.  Musculoskeletal: Negative for myalgias, back pain and joint pain.  Skin: Negative for itching and rash.  Neurological: Negative for dizziness, tremors, focal weakness, seizures and loss of consciousness.  Endo/Heme/Allergies: Negative for environmental allergies.  Psychiatric/Behavioral: Negative for depression, suicidal ideas and hallucinations.  All other systems reviewed and are negative.  Physical Exam: Blood pressure 140/88, pulse 98, height 5\' 5"  (1.651 m), weight 163 lb (73.9 kg), SpO2 99 %. Gen:      No acute distress HEENT:  EOMI, sclera anicteric Neck:     No masses; no thyromegaly Lungs:    Clear to auscultation bilaterally; normal respiratory effort CV:         Regular rate and rhythm; no murmurs Abd:      + bowel sounds; soft, non-tender; no palpable masses, no distension Ext:    No edema; adequate peripheral perfusion Skin:      Warm and dry; no rash Neuro: alert and oriented x 3 Psych: normal mood  and affect  Data Reviewed: CT scan chest 08/07/2017- 16 mm right middle lobe nodule, multiple scattered subcentimeter pulmonary nodule.  Right middle lobe fibrosis PET scan 08/14/2017- 16 mm right middle lobe nodule has SUV of 2.8.  The other lung nodules are below the limits of detection by PET scan.  Nonspecific uptake in the left pectoral muscle. I have reviewed the images personally.   Labs ANA, CCP, rheumatoid factor 09/08/2017-negative  Assessment:  Multiple lung nodules The largest RML nodule has mild uptake which is certainly concerning for malignancy. PET scan does not show any other uptake and she has minimal smoking history.  History also noted for breast cancer with no recurrence since 1980.  She has significant small joint arthritis with swelling of the interphalangeal joint which is concerning for rheumatoid arthritis. This can can present as lung nodules with mild PET uptake We will defer lung biopsy for now until she can be evaluated for rheumatoid arthritis Although ANA, rheumatoid factor, CCP are negative I would like her to see a rheumatologist as she could still have seronegative arthritis  We will get a follow-up CT to be done in mid June for 3 month follow up.  If there is any growth then we will have to go ahead with the lung biopsy. Discussed plan and follow-up in detail with patient and her daughter today.  Plan/Recommendations: - Await rheumatology eval - Follow-up CT in 1 month.  Marshell Garfinkel MD [facility] 10/25/2017, 12:15 PM  CC: Leonard Downing, *

## 2017-10-25 NOTE — Patient Instructions (Signed)
We will get a CT chest without contrast around mid to late July for follow-up of lung nodules We will get PFTs in clinic visit after CT scan for review  We will check to see if he can get a sooner appointment with rheumatology Otherwise keep your appointment with Dr. Trudie Reed or the PA for evaluation of rheumatoid arthritis

## 2017-11-22 ENCOUNTER — Ambulatory Visit (INDEPENDENT_AMBULATORY_CARE_PROVIDER_SITE_OTHER)
Admission: RE | Admit: 2017-11-22 | Discharge: 2017-11-22 | Disposition: A | Discharge status: Home / Self Care | Payer: Medicare Other | Source: Ambulatory Visit | Attending: Pulmonary Disease | Admitting: Pulmonary Disease

## 2017-11-22 DIAGNOSIS — R918 Other nonspecific abnormal finding of lung field: Secondary | ICD-10-CM | POA: Diagnosis not present

## 2017-12-04 ENCOUNTER — Ambulatory Visit (INDEPENDENT_AMBULATORY_CARE_PROVIDER_SITE_OTHER): Payer: Medicare Other | Admitting: Pulmonary Disease

## 2017-12-04 ENCOUNTER — Encounter: Payer: Self-pay | Admitting: Pulmonary Disease

## 2017-12-04 VITALS — BP 138/78 | HR 85 | Ht 65.0 in | Wt 156.0 lb

## 2017-12-04 DIAGNOSIS — R911 Solitary pulmonary nodule: Secondary | ICD-10-CM

## 2017-12-04 DIAGNOSIS — R0602 Shortness of breath: Secondary | ICD-10-CM | POA: Diagnosis not present

## 2017-12-04 DIAGNOSIS — R918 Other nonspecific abnormal finding of lung field: Secondary | ICD-10-CM | POA: Diagnosis not present

## 2017-12-04 LAB — PULMONARY FUNCTION TEST
DL/VA % pred: 102 %
DL/VA: 5.07 ml/min/mmHg/L
DLCO unc % pred: 82 %
DLCO unc: 21.13 ml/min/mmHg
FEF 25-75 Post: 2 L/sec
FEF 25-75 Pre: 1.61 L/sec
FEF2575-%Change-Post: 24 %
FEF2575-%Pred-Post: 125 %
FEF2575-%Pred-Pre: 101 %
FEV1-%Change-Post: 6 %
FEV1-%Pred-Post: 95 %
FEV1-%Pred-Pre: 89 %
FEV1-Post: 2.01 L
FEV1-Pre: 1.89 L
FEV1FVC-%Change-Post: 2 %
FEV1FVC-%Pred-Pre: 105 %
FEV6-%Change-Post: 4 %
FEV6-%Pred-Post: 94 %
FEV6-%Pred-Pre: 90 %
FEV6-Post: 2.53 L
FEV6-Pre: 2.43 L
FEV6FVC-%Change-Post: 0 %
FEV6FVC-%Pred-Post: 105 %
FEV6FVC-%Pred-Pre: 105 %
FVC-%Change-Post: 4 %
FVC-%Pred-Post: 89 %
FVC-%Pred-Pre: 85 %
FVC-Post: 2.53 L
FVC-Pre: 2.43 L
Post FEV1/FVC ratio: 80 %
Post FEV6/FVC ratio: 100 %
Pre FEV1/FVC ratio: 78 %
Pre FEV6/FVC Ratio: 100 %
RV % pred: 101 %
RV: 2.42 L
TLC % pred: 92 %
TLC: 4.79 L

## 2017-12-04 NOTE — Progress Notes (Addendum)
Brenda Pham    [id number]    Jan 28, 1941  Primary Care Physician:Elkins, Curt Jews, MD  Referring Physician: Leonard Downing, MD [address] [place], [place] [address]  Chief complaint: Follow-up for lung nodules  HPI: 77 year old with history of breast cancer, arthritis, diabetes She had a CT scan and PET scan which showed multiple lung nodules with lobe nodule and has been referred here for further evaluation Has occasional nonproductive cough.  Denies any sputum, weight loss, hemoptysis Complains of joint pain and stiffness in the small joints of the hand that has been progressively getting worse.   History of breast cancer diagnosed in 1980s status post radical mastectomy and breast reconstruction.  There is no evidence of recurrence and since then  Pets: No pets Occupation: Retired Network engineer Exposures: No known exposure, no mold, hot tubs, Jacuzzi Smoking history: 10-pack-year smoking history.  Quit in 1980 Travel history: Lived in [place] all her life.  No significant travel Relevant family history: No significant family history of lung issues  Interim history: Seen by Dr. Trudie Reed rheumatology who feels that she does not have rheumatoid arthritis She had a follow-up scan which shows stable lung nodule and is here for follow-up. Does not have any new symptoms.  Denies any cough, sputum vision, fevers, chills.  Outpatient Encounter Medications as of 12/04/2017  Medication Sig  . atorvastatin (LIPITOR) 20 MG tablet Take 20 mg by mouth at bedtime.  . naproxen sodium (ALEVE) 220 MG tablet Take 220 mg by mouth.   No facility-administered encounter medications on file as of 12/04/2017.     Allergies as of 12/04/2017 - Review Complete 12/04/2017  Allergen Reaction Noted  . Sulfa antibiotics Rash 04/11/2016  . Niacin and related  07/02/2014  . Other  06/25/2014    Past Medical History:  Diagnosis Date  . Arthritis    osteoarthritis  -knees, hands  . Cancer [facility])    '80-right breast, surgery and radiation.  . Diabetes mellitus without complication ([place])    recently dx. no oral meds or insulin.  Marland Kitchen Hyperlipidemia   . PONV (postoperative nausea and vomiting)     Past Surgical History:  Procedure Laterality Date  . APPENDECTOMY    . BREAST SURGERY     Rt. breast modified mastectomy/(reconstuction)-last '03 tranflap(tissue from abdomen); and left subcutaneoues mastectomy and implant left breast-last implant '02.  Marland Kitchen CATARACT EXTRACTION, BILATERAL     last 06-11-14 left  . DILATION AND CURETTAGE OF UTERUS    . FINGER SURGERY Right    right ring finger-excsion cyst and nodules.  Marland Kitchen FOOT SURGERY Left    '10- 2nd toe   . KNEE ARTHROSCOPY Right   . KNEE ARTHROSCOPY Left    scope with hammer toe, '06- scope for torn cartilage  . TOTAL KNEE ARTHROPLASTY Right 07/07/2014   Procedure: RIGHT TOTAL KNEE ARTHROPLASTY;  Surgeon: Paralee Cancel, MD;  Location: [facility];  Service: Orthopedics;  Laterality: Right;    Family History  Problem Relation Age of Onset  . Allergic rhinitis Daughter   . Hypertension Mother   . Diabetes Mother   . Kidney disease Mother     Social History   Socioeconomic History  . Marital status: Married    Spouse name: Not on file  . Number of children: Not on file  . Years of education: Not on file  . Highest education level: Not on file  Occupational History  . Not on file  Social Needs  . Financial resource strain: Not on file  . Food insecurity:    Worry: Not on file    Inability: Not on file  . Transportation needs:    Medical: Not on file    Non-medical: Not on file  Tobacco Use  . Smoking status: Former Smoker    Packs/day: 1.00    Years: 10.00    Pack years: 10.00    Types: Cigarettes    Last attempt to quit: 07/02/1978    Years since quitting: 39.4  . Smokeless tobacco: Never Used  Substance and Sexual Activity  . Alcohol use: No  . Drug use: No  . Sexual activity: Not  Currently  Lifestyle  . Physical activity:    Days per week: Not on file    Minutes per session: Not on file  . Stress: Not on file  Relationships  . Social connections:    Talks on phone: Not on file    Gets together: Not on file    Attends religious service: Not on file    Active member of club or organization: Not on file    Attends meetings of clubs or organizations: Not on file    Relationship status: Not on file  . Intimate partner violence:    Fear of current or ex partner: Not on file    Emotionally abused: Not on file    Physically abused: Not on file    Forced sexual activity: Not on file  Other Topics Concern  . Not on file  Social History Narrative  . Not on file    Review of systems: Review of Systems  Constitutional: Negative for fever and chills.  HENT: Negative.   Eyes: Negative for blurred vision.  Respiratory: as per HPI  Cardiovascular: Negative for chest pain and palpitations.  Gastrointestinal: Negative for vomiting, diarrhea, blood per rectum. Genitourinary: Negative for dysuria, urgency, frequency and hematuria.  Musculoskeletal: Negative for myalgias, back pain and joint pain.  Skin: Negative for itching and rash.  Neurological: Negative for dizziness, tremors, focal weakness, seizures and loss of consciousness.  Endo/Heme/Allergies: Negative for environmental allergies.  Psychiatric/Behavioral: Negative for depression, suicidal ideas and hallucinations.  All other systems reviewed and are negative.  Physical Exam: Blood pressure 140/88, pulse 98, height 5\' 5"  (1.651 m), weight 163 lb (73.9 kg), SpO2 99 %. Gen:      No acute distress HEENT:  EOMI, sclera anicteric Neck:     No masses; no thyromegaly Lungs:    Clear to auscultation bilaterally; normal respiratory effort CV:         Regular rate and rhythm; no murmurs Abd:      + bowel sounds; soft, non-tender; no palpable masses, no distension Ext:    No edema; adequate peripheral  perfusion Skin:      Warm and dry; no rash Neuro: alert and oriented x 3 Psych: normal mood and affect  Data Reviewed: CT scan chest 08/07/2017- 16 mm right middle lobe nodule, multiple scattered subcentimeter pulmonary nodule.  Right middle lobe fibrosis PET scan 08/14/2017- 16 mm right middle lobe nodule has SUV of 2.8.  The other lung nodules are below the limits of detection by PET scan.  Nonspecific uptake in the left pectoral muscle. CT scan 09/22/2017- stable lung nodules with no change in size. I have reviewed the images personally.   Labs ANA, CCP, rheumatoid factor 09/08/2017-negative  PFTs 12/04/2017 FVC 2.53 [89%), FEV1 2.01 [95%], F/F 80, TLC 92%, DLCO 82% Normal test  Assessment:  Multiple lung nodules The largest RML nodule has mild uptake. PET scan does not show any other uptake and she has minimal smoking history.  History also noted for breast cancer with no recurrence since 1980.  Differential diagnosis include slow-growing tumor or carcinoid.  Evaluated by rheumatology who do not feel she has rheumatoid arthritis I have reviewed her last CT scan which shows stable lung nodule with no change in size. Estimated risk of malignancy by [facility] is 14-15%  Discussed biopsy vs continued follow-up.  Given stability of her nodule I have advised a follow-up CT in 6 months time. We will see if we can perform the [employer] blood test to get further information on the lung nodule.  Discussed plan and follow-up in detail with patient and her daughter today.  Plan/Recommendations: - Follow up CT scan in 6 months.   Marshell Garfinkel MD [facility] 12/04/2017, 1:33 PM  CC: Leonard Downing, *  Addendum: Received note from [facility] rheumatology dated 11/21/2017 Patient's overall presentation consistent with osteoarthritis.  She has no findings to suggest rheumatoid arthritis.  X-ray of right and left hand 11/21/2017 Mild erosive and degenerative  osteoarthropathy  Marshell Garfinkel MD [facility] 01/15/2018, 12:57 PM

## 2017-12-04 NOTE — Patient Instructions (Signed)
I have reviewed his CT scan which shows stable lung nodules with no change in size compared to April 2019 We will continue to monitor this for now.  Follow-up CT without contrast in 6 months

## 2017-12-04 NOTE — Progress Notes (Signed)
PFT done today. 

## 2017-12-19 ENCOUNTER — Telehealth: Payer: Self-pay | Admitting: Pulmonary Disease

## 2017-12-19 NOTE — Telephone Encounter (Signed)
lmtcb x1 for pt to discuss getting pt scheduled for [employer] lab and appointment with rep on 12/27/17 at 10:15. Routing message to myself for f/u.

## 2017-12-20 NOTE — Telephone Encounter (Signed)
Pt has been scheduled to meet with [employer] rep on 12/27/17 at 10:30. Nothing further is needed.

## 2017-12-20 NOTE — Telephone Encounter (Signed)
Pt is returning call. Cb is [phone].

## 2018-01-08 ENCOUNTER — Telehealth: Payer: Self-pay | Admitting: Pulmonary Disease

## 2018-01-08 NOTE — Telephone Encounter (Signed)
Reviewed results of the [employer] study (scanned into computer) Test results shows very low risk about 4% risk of malignancy. Negative predictive value 96%  The result of the test is reassuring. Will continue monitoring on CT scan. Discussed with patient over telephone  Marshell Garfinkel MD [facility] 01/08/2018, 9:29 AM

## 2018-06-07 ENCOUNTER — Inpatient Hospital Stay: Admission: RE | Admit: 2018-06-07 | Payer: Medicare Other | Source: Ambulatory Visit

## 2018-06-25 ENCOUNTER — Ambulatory Visit (INDEPENDENT_AMBULATORY_CARE_PROVIDER_SITE_OTHER)
Admission: RE | Admit: 2018-06-25 | Discharge: 2018-06-25 | Disposition: A | Discharge status: Home / Self Care | Payer: Medicare Other | Source: Ambulatory Visit | Attending: Pulmonary Disease | Admitting: Pulmonary Disease

## 2018-06-25 DIAGNOSIS — R911 Solitary pulmonary nodule: Secondary | ICD-10-CM

## 2018-07-09 ENCOUNTER — Telehealth: Payer: Self-pay | Admitting: Pulmonary Disease

## 2018-07-09 DIAGNOSIS — R918 Other nonspecific abnormal finding of lung field: Secondary | ICD-10-CM

## 2018-07-09 NOTE — Telephone Encounter (Signed)
Notes recorded by Marshell Garfinkel, MD on 07/09/2018 at 11:38 AM EDT Please let patient know the lung nodules are stable with no change in size which is good news  Order follow-up CT without contrast in 1 year. __________________________________  Brenda Pham and spoke with patient she is aware and verbalized understanding. Order has been placed. Nothing further needed.

## 2019-07-10 ENCOUNTER — Ambulatory Visit
Admission: RE | Admit: 2019-07-10 | Discharge: 2019-07-10 | Disposition: A | Discharge status: Home / Self Care | Payer: Medicare Other | Source: Ambulatory Visit | Attending: Pulmonary Disease | Admitting: Pulmonary Disease

## 2019-07-10 DIAGNOSIS — R918 Other nonspecific abnormal finding of lung field: Secondary | ICD-10-CM

## 2019-07-10 IMAGING — CT CT CHEST W/O CM
2 of 4 series · 13 of 36 positions shown, 16 images · non-contrast
Comparison: None.

CLINICAL DATA: Pulmonary nodule.

EXAM:
CT CHEST WITHOUT CONTRAST
TECHNIQUE: Multidetector CT imaging of the chest was performed following the
standard protocol without IV contrast.

[Series 2: chest 2.00 br40 s3 · axial · 0.61mm/px · z∈[+1740,+1992]mm · 10 of 150 slices shown, 13 images (1 of 2)]
[im 12/150  mediastinal]
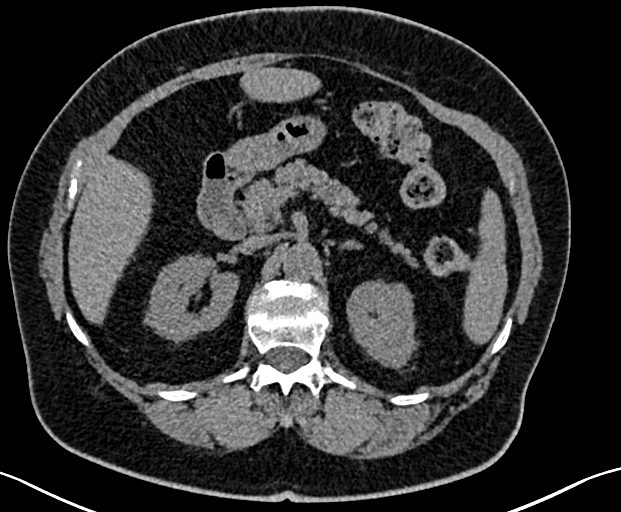
[im 12/150  lung]
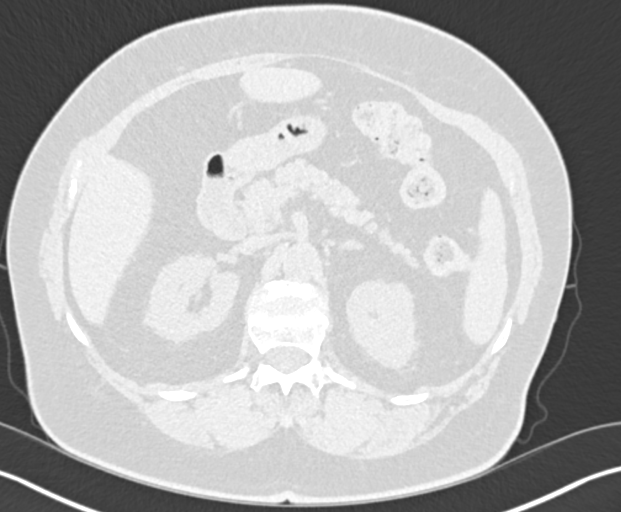
[im 23/150  lung]
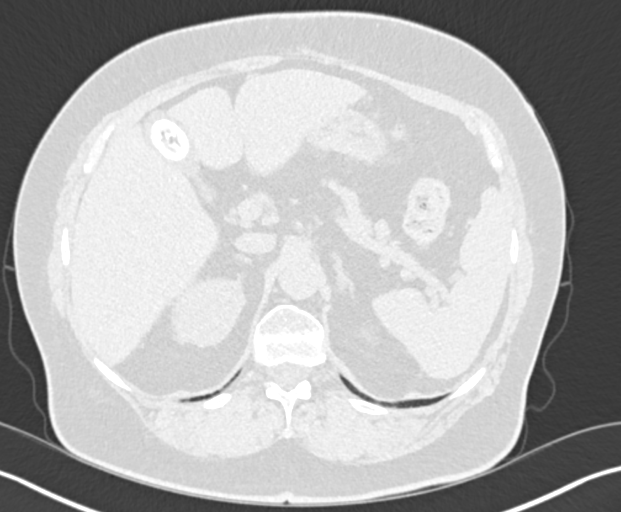
[im 46/150  lung]
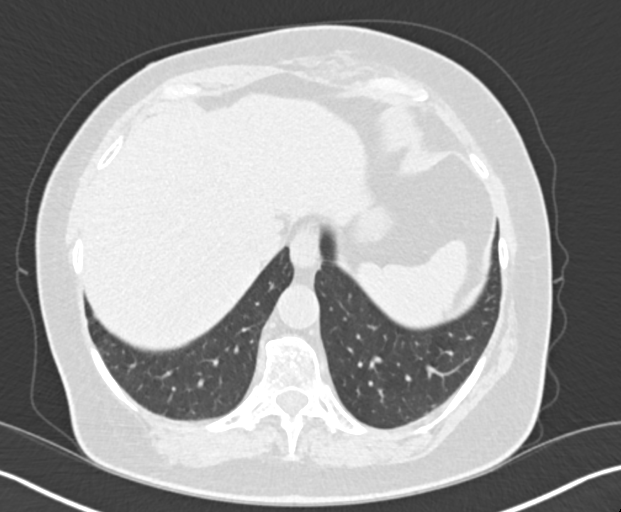
[im 58/150  lung]
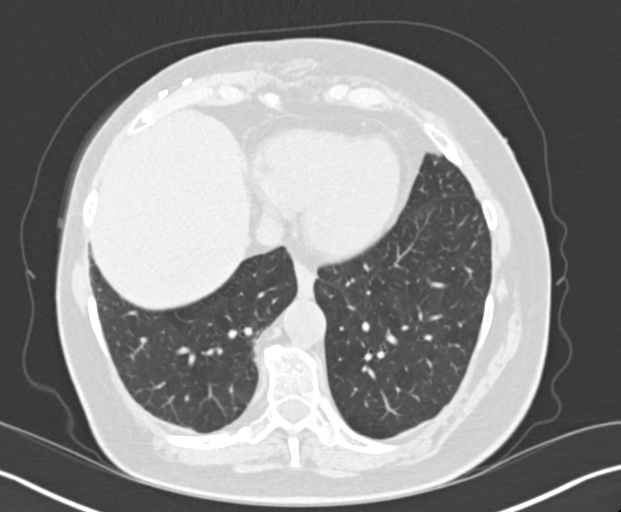
[im 69/150  mediastinal]
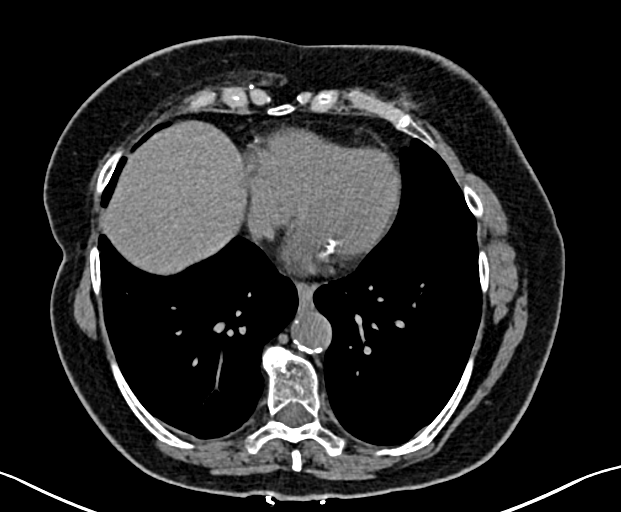
[im 69/150  lung]
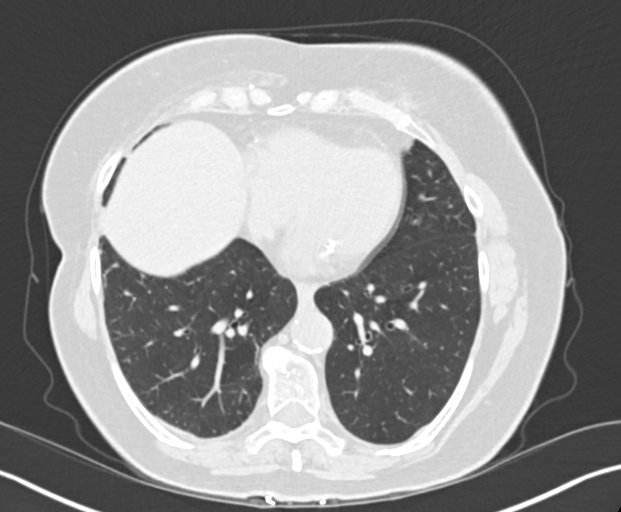
[im 81/150  lung]
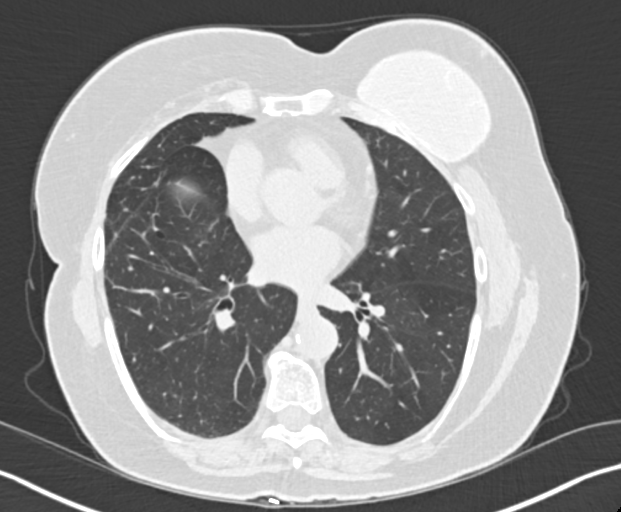
[im 92/150  lung]
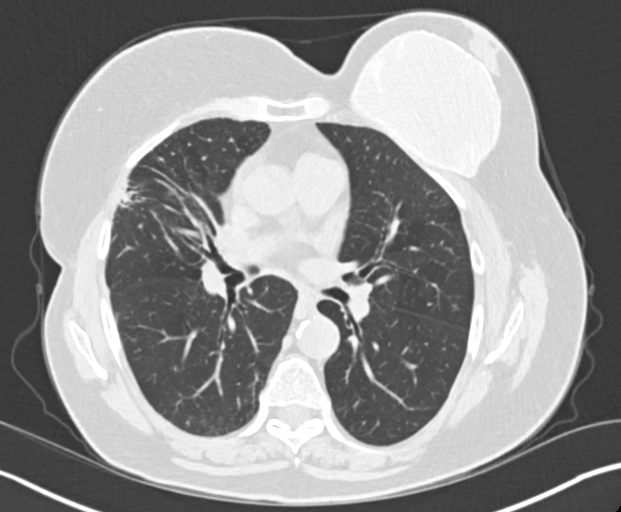
[im 115/150  lung]
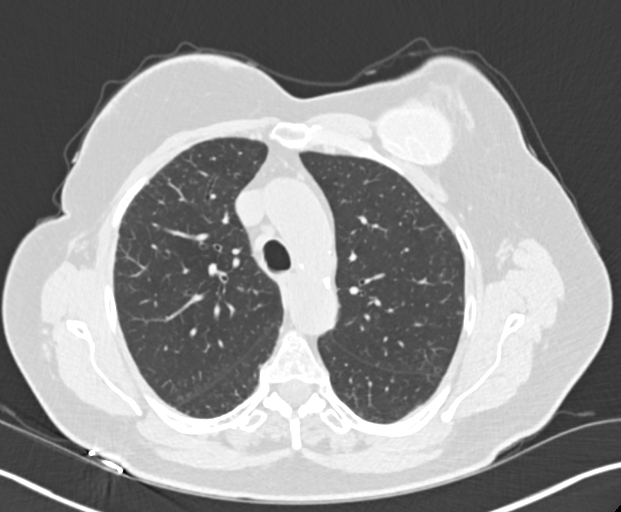
[im 127/150  mediastinal]
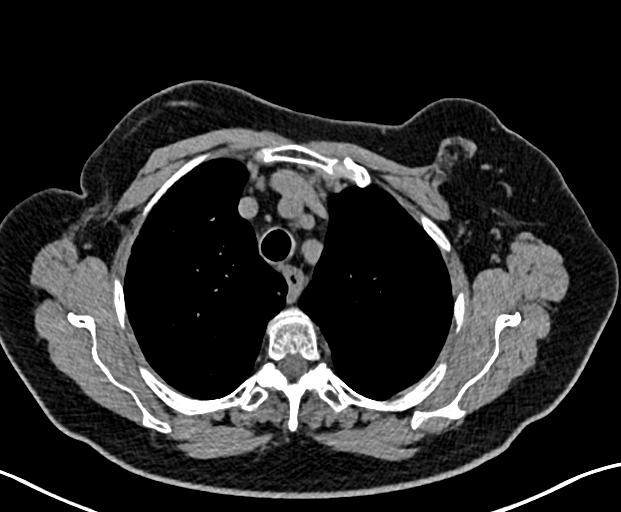
[im 127/150  lung]
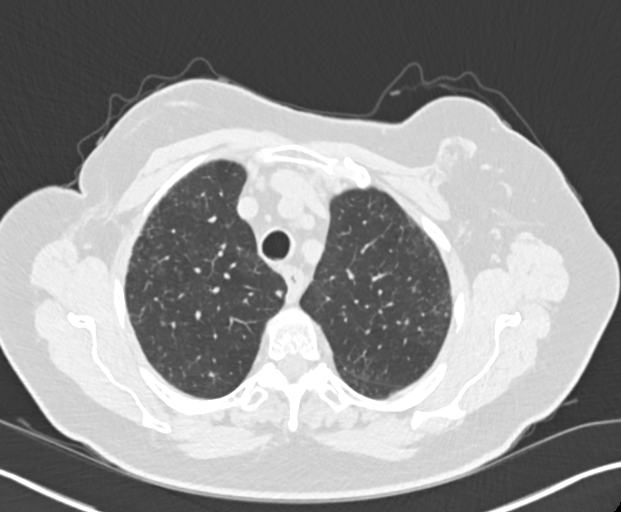
[im 138/150  lung]
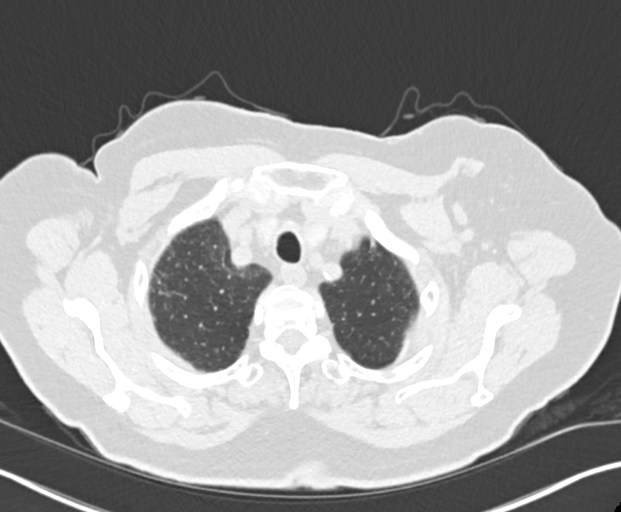

[Series 4: chest 2.00 br40 s3 · coronal · 0.59mm/px · 3 of 155 slices shown (2 of 2)]
[im 31/155  lung]
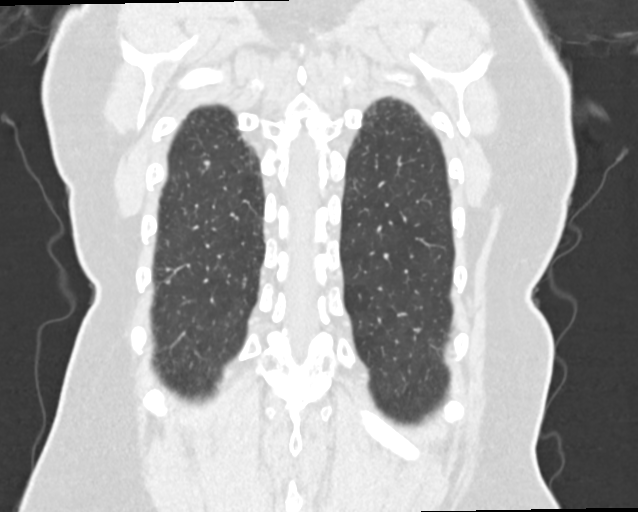
[im 62/155  lung]
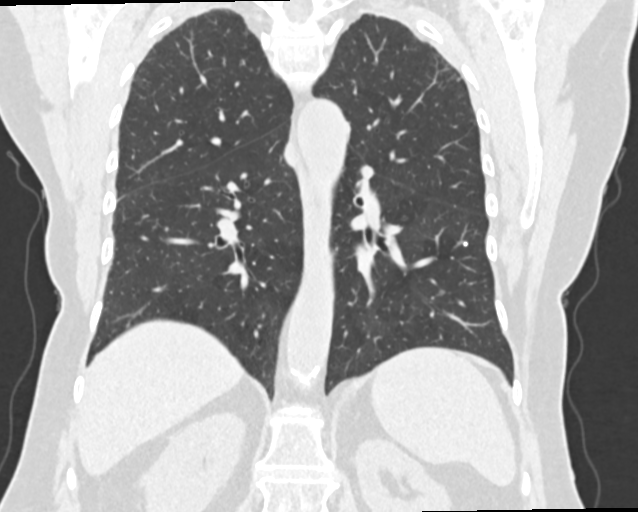
[im 93/155  lung]
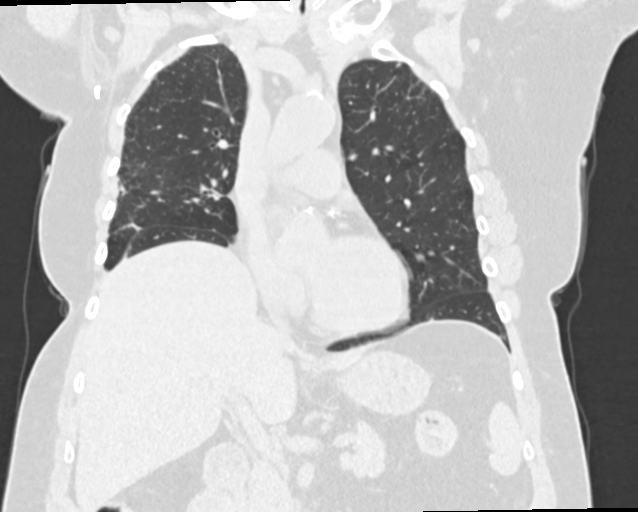

[13 of 36 positions shown; findings below may reference images not displayed]

FINDINGS: Cardiovascular: The heart size is normal. No substantial pericardial
effusion. Coronary artery calcification is evident. Atherosclerotic
calcification is noted in the wall of the thoracic aorta.

Mediastinum/Nodes: No mediastinal lymphadenopathy. No evidence for
gross hilar lymphadenopathy although assessment is limited by the
lack of intravenous contrast on today's study. The esophagus has
normal imaging features. There is no axillary lymphadenopathy. 8 mm
short axis right axillary node on image 10/series 2 is stable in the
interval.

Lungs/Pleura: Subpleural scarring in the right lung is stable.
Dominant right lung nodule measuring 16 mm today is stable when
comparing to prior study. Other scattered right lung nodules range
in size from several mm up to 7 mm and are not substantially changed
since prior. Tiny left pulmonary nodules are stable. No new
suspicious pulmonary nodule or mass. No pleural effusion.

Upper Abdomen: Calcified gallstone again noted.

Musculoskeletal: No worrisome lytic or sclerotic osseous
abnormality.
IMPRESSION: Stable bilateral pulmonary nodules.  No new or progressive findings.

Cholelithiasis.

Aortic Atherosclerosis ([G2]-170.0)

## 2019-07-19 ENCOUNTER — Telehealth: Payer: Self-pay

## 2019-07-19 DIAGNOSIS — R918 Other nonspecific abnormal finding of lung field: Secondary | ICD-10-CM

## 2019-07-19 NOTE — Telephone Encounter (Signed)
-----   Message from Marshell Garfinkel, MD sent at 07/18/2019 11:42 AM EDT ----- Please let patient know chest x-ray shows lung nodule is stable.Order follow-up CT without contrast in 1 year.

## 2020-07-01 ENCOUNTER — Ambulatory Visit
Admission: RE | Admit: 2020-07-01 | Discharge: 2020-07-01 | Disposition: A | Discharge status: Home / Self Care | Payer: Medicare Other | Source: Ambulatory Visit | Attending: Pulmonary Disease | Admitting: Pulmonary Disease

## 2020-07-01 DIAGNOSIS — R918 Other nonspecific abnormal finding of lung field: Secondary | ICD-10-CM

## 2020-07-03 ENCOUNTER — Other Ambulatory Visit: Payer: Self-pay | Admitting: *Deleted

## 2020-07-03 DIAGNOSIS — R918 Other nonspecific abnormal finding of lung field: Secondary | ICD-10-CM

## 2020-07-03 NOTE — Progress Notes (Signed)
Called and left message on voicemail to please return phone call to go over results. Contact number provided. 

## 2020-07-06 ENCOUNTER — Other Ambulatory Visit: Payer: Self-pay | Admitting: *Deleted

## 2020-07-06 DIAGNOSIS — R918 Other nonspecific abnormal finding of lung field: Secondary | ICD-10-CM

## 2020-08-03 ENCOUNTER — Encounter: Payer: Self-pay | Admitting: Pulmonary Disease

## 2020-08-03 ENCOUNTER — Ambulatory Visit (INDEPENDENT_AMBULATORY_CARE_PROVIDER_SITE_OTHER): Payer: Medicare Other | Admitting: Pulmonary Disease

## 2020-08-03 ENCOUNTER — Other Ambulatory Visit: Payer: Self-pay

## 2020-08-03 VITALS — BP 124/80 | HR 103 | Temp 98.0°F | Ht 63.0 in | Wt 159.6 lb

## 2020-08-03 DIAGNOSIS — R918 Other nonspecific abnormal finding of lung field: Secondary | ICD-10-CM

## 2020-08-03 NOTE — Progress Notes (Signed)
Brenda Pham    [id number]    1940-12-12  Primary Care Physician:Elkins, Curt Jews, MD  Referring Physician: Leonard Downing, MD [address] [place],  [place] [address]  Chief complaint: Follow-up for lung nodules  HPI: 80 year old with history of breast cancer, arthritis, diabetes She had a CT scan and PET scan which showed multiple lung nodules with lobe nodule and has been referred here for further evaluation Has occasional nonproductive cough.  Denies any sputum, weight loss, hemoptysis Complains of joint pain and stiffness in the small joints of the hand  Evaluated by Dr. Trudie Reed, [facility] rheumatology in 2019 who felt patient's overall presentation consistent with osteoarthritis.  She has no findings to suggest rheumatoid arthritis. He had ordered History of breast cancer diagnosed in 1980s status post radical mastectomy and breast reconstruction.  There is no evidence of recurrence and since then  Pets: No pets Occupation: Retired Network engineer Exposures: No known exposure, no mold, hot tubs, Jacuzzi Smoking history: 10-pack-year smoking history.  Quit in 1980 Travel history: Lived in [place] all her life.  No significant travel Relevant family history: No significant family history of lung issues  Interim history: Seen by Dr. Trudie Reed rheumatology who feels that she does not have rheumatoid arthritis She had a follow-up scan which shows stable lung nodule and is here for follow-up. Does not have any new symptoms.  Denies any cough, sputum vision, fevers, chills.  Outpatient Encounter Medications as of 08/03/2020  Medication Sig  . alendronate (FOSAMAX) 70 MG tablet Take 70 mg by mouth once a week.  Marland Kitchen atorvastatin (LIPITOR) 20 MG tablet Take 20 mg by mouth at bedtime.  . naproxen sodium (ALEVE) 220 MG tablet Take 220 mg by mouth.   No facility-administered encounter medications on file as of 08/03/2020.   Physical Exam: Blood pressure  124/80, pulse (!) 103, temperature 98 F (36.7 C), temperature source Temporal, height 5\' 3"  (1.6 m), weight 159 lb 9.6 oz (72.4 kg), SpO2 98 %. Gen:      No acute distress HEENT:  EOMI, sclera anicteric Neck:     No masses; no thyromegaly Lungs:    Clear to auscultation bilaterally; normal respiratory effort CV:         Regular rate and rhythm; no murmurs Abd:      + bowel sounds; soft, non-tender; no palpable masses, no distension Ext:    No edema; adequate peripheral perfusion Skin:      Warm and dry; no rash Neuro: alert and oriented x 3 Psych: normal mood and affect  Data Reviewed: CT scan chest 08/07/2017- 16 mm right middle lobe nodule, multiple scattered subcentimeter pulmonary nodule.  Right middle lobe fibrosis PET scan 08/14/2017- 16 mm right middle lobe nodule has SUV of 2.8.  The other lung nodules are below the limits of detection by PET scan.  Nonspecific uptake in the left pectoral muscle. CT scan 09/22/2017- stable lung nodules with no change in size. I have reviewed the images personally.   X-ray of right and left hand 11/21/2017 Mild erosive and degenerative osteoarthropathy  PFTs 12/04/2017 FVC 2.53 [89%), FEV1 2.01 [95%], F/F 80, TLC 92%, DLCO 82% Normal test  Labs ANA, CCP, rheumatoid factor 09/08/2017-negative  [employer] study 12/27/2017 Test results shows very low risk about 4% risk of malignancy. Negative predictive value 96%  Assessment:  Multiple lung nodules The largest RML nodule has mild uptake. PET scan does not show any other uptake and she  has minimal smoking history.  History also noted for breast cancer with no recurrence since 1980.  Differential diagnosis include slow-growing tumor or carcinoid.  Her latest CT scan shows stability of lung nodules from 2019 to now.  This is reassuring. We will get a follow-up CT in 2 years and return to clinic after scan for review  Plan/Recommendations: - Follow up CT scan in 2 years  Marshell Garfinkel MD [facility] 08/03/2020, 12:11 PM  CC: Leonard Downing, *

## 2020-08-03 NOTE — Patient Instructions (Signed)
We will get a CT chest without contrast in 2 years for follow-up of lung nodule Follow-up in clinic after CT scan

## 2020-11-06 ENCOUNTER — Other Ambulatory Visit: Payer: Self-pay | Admitting: Family Medicine

## 2020-11-06 ENCOUNTER — Ambulatory Visit
Admission: RE | Admit: 2020-11-06 | Discharge: 2020-11-06 | Disposition: A | Discharge status: Home / Self Care | Payer: Medicare Other | Source: Ambulatory Visit | Attending: Family Medicine | Admitting: Family Medicine

## 2020-11-06 DIAGNOSIS — R053 Chronic cough: Secondary | ICD-10-CM

## 2020-12-28 DIAGNOSIS — E11621 Type 2 diabetes mellitus with foot ulcer: Secondary | ICD-10-CM | POA: Insufficient documentation

## 2021-03-01 DIAGNOSIS — L02612 Cutaneous abscess of left foot: Secondary | ICD-10-CM | POA: Insufficient documentation

## 2021-03-17 ENCOUNTER — Ambulatory Visit
Admission: RE | Admit: 2021-03-17 | Discharge: 2021-03-17 | Disposition: A | Discharge status: Home / Self Care | Payer: Medicare Other | Source: Ambulatory Visit | Attending: Family Medicine | Admitting: Family Medicine

## 2021-03-17 ENCOUNTER — Other Ambulatory Visit: Payer: Self-pay | Admitting: Family Medicine

## 2021-03-17 DIAGNOSIS — M7918 Myalgia, other site: Secondary | ICD-10-CM

## 2021-03-19 ENCOUNTER — Other Ambulatory Visit: Payer: Self-pay | Admitting: Family Medicine

## 2021-03-19 DIAGNOSIS — M7989 Other specified soft tissue disorders: Secondary | ICD-10-CM

## 2021-04-15 ENCOUNTER — Other Ambulatory Visit: Payer: Self-pay

## 2021-04-15 ENCOUNTER — Ambulatory Visit
Admission: RE | Admit: 2021-04-15 | Discharge: 2021-04-15 | Disposition: A | Discharge status: Home / Self Care | Payer: Medicare Other | Source: Ambulatory Visit | Attending: Family Medicine | Admitting: Family Medicine

## 2021-04-15 DIAGNOSIS — M7989 Other specified soft tissue disorders: Secondary | ICD-10-CM

## 2021-04-15 MED ORDER — IOPAMIDOL (ISOVUE-300) INJECTION 61%
100.0000 mL | Freq: Once | INTRAVENOUS | Status: AC | PRN
  Administered 2021-04-15: 100 mL via INTRAVENOUS

## 2021-08-06 DIAGNOSIS — E1142 Type 2 diabetes mellitus with diabetic polyneuropathy: Secondary | ICD-10-CM | POA: Diagnosis present

## 2021-10-27 DIAGNOSIS — M21622 Bunionette of left foot: Secondary | ICD-10-CM | POA: Insufficient documentation

## 2021-11-11 ENCOUNTER — Encounter (HOSPITAL_BASED_OUTPATIENT_CLINIC_OR_DEPARTMENT_OTHER): Payer: Self-pay | Admitting: Orthopedic Surgery

## 2021-11-11 ENCOUNTER — Other Ambulatory Visit: Payer: Self-pay

## 2021-11-11 ENCOUNTER — Other Ambulatory Visit (HOSPITAL_COMMUNITY): Payer: Self-pay | Admitting: Orthopedic Surgery

## 2021-11-12 ENCOUNTER — Encounter (HOSPITAL_BASED_OUTPATIENT_CLINIC_OR_DEPARTMENT_OTHER)
Admission: RE | Admit: 2021-11-12 | Discharge: 2021-11-12 | Disposition: A | Discharge status: Home / Self Care | Payer: Medicare Other | Source: Ambulatory Visit | Attending: Orthopedic Surgery | Admitting: Orthopedic Surgery

## 2021-11-12 DIAGNOSIS — Z01818 Encounter for other preprocedural examination: Secondary | ICD-10-CM | POA: Insufficient documentation

## 2021-11-12 LAB — BASIC METABOLIC PANEL
Anion gap: 10 (ref 5–15)
BUN: 15 mg/dL (ref 8–23)
CO2: 23 mmol/L (ref 22–32)
Calcium: 9.9 mg/dL (ref 8.9–10.3)
Chloride: 105 mmol/L (ref 98–111)
Creatinine, Ser: 0.84 mg/dL (ref 0.44–1.00)
GFR, Estimated: >60 mL/min (ref >60)
Glucose, Bld: 110 mg/dL — ABNORMAL HIGH (ref 70–99)
Potassium: 4.8 mmol/L (ref 3.5–5.1)
Sodium: 138 mmol/L (ref 135–145)

## 2021-11-12 NOTE — Progress Notes (Signed)

## 2021-11-18 ENCOUNTER — Encounter (HOSPITAL_BASED_OUTPATIENT_CLINIC_OR_DEPARTMENT_OTHER): Payer: Self-pay | Admitting: Orthopedic Surgery

## 2021-11-18 ENCOUNTER — Other Ambulatory Visit: Payer: Self-pay

## 2021-11-18 ENCOUNTER — Ambulatory Visit (HOSPITAL_BASED_OUTPATIENT_CLINIC_OR_DEPARTMENT_OTHER): Payer: Medicare Other | Admitting: Anesthesiology

## 2021-11-18 ENCOUNTER — Ambulatory Visit (HOSPITAL_BASED_OUTPATIENT_CLINIC_OR_DEPARTMENT_OTHER)
Admission: RE | Admit: 2021-11-18 | Discharge: 2021-11-18 | Disposition: A | Discharge status: Home / Self Care | Payer: Medicare Other | Attending: Orthopedic Surgery | Admitting: Orthopedic Surgery

## 2021-11-18 ENCOUNTER — Encounter (HOSPITAL_BASED_OUTPATIENT_CLINIC_OR_DEPARTMENT_OTHER)
Admission: RE | Disposition: A | Discharge status: Home / Self Care | Payer: Self-pay | Source: Home / Self Care | Attending: Orthopedic Surgery

## 2021-11-18 ENCOUNTER — Ambulatory Visit (HOSPITAL_BASED_OUTPATIENT_CLINIC_OR_DEPARTMENT_OTHER): Payer: Medicare Other

## 2021-11-18 DIAGNOSIS — Z7984 Long term (current) use of oral hypoglycemic drugs: Secondary | ICD-10-CM | POA: Insufficient documentation

## 2021-11-18 DIAGNOSIS — L97529 Non-pressure chronic ulcer of other part of left foot with unspecified severity: Secondary | ICD-10-CM | POA: Insufficient documentation

## 2021-11-18 DIAGNOSIS — Z87891 Personal history of nicotine dependence: Secondary | ICD-10-CM | POA: Diagnosis not present

## 2021-11-18 DIAGNOSIS — M1990 Unspecified osteoarthritis, unspecified site: Secondary | ICD-10-CM | POA: Diagnosis not present

## 2021-11-18 DIAGNOSIS — E11621 Type 2 diabetes mellitus with foot ulcer: Secondary | ICD-10-CM | POA: Diagnosis present

## 2021-11-18 DIAGNOSIS — Z01818 Encounter for other preprocedural examination: Secondary | ICD-10-CM

## 2021-11-18 DIAGNOSIS — L97509 Non-pressure chronic ulcer of other part of unspecified foot with unspecified severity: Secondary | ICD-10-CM

## 2021-11-18 HISTORY — PX: AMPUTATION: SHX166

## 2021-11-18 SURGERY — AMPUTATION, FOOT, RAY
Anesthesia: Monitor Anesthesia Care | Site: Foot | Laterality: Left

## 2021-11-18 MED ORDER — CEFAZOLIN SODIUM-DEXTROSE 2-4 GM/100ML-% IV SOLN
INTRAVENOUS | Status: AC
  Filled 2021-11-18: qty 100

## 2021-11-18 MED ORDER — ONDANSETRON HCL 4 MG/2ML IJ SOLN
INTRAMUSCULAR | Status: DC | PRN
  Administered 2021-11-18: 4 mg via INTRAVENOUS

## 2021-11-18 MED ORDER — VANCOMYCIN HCL 500 MG IV SOLR
INTRAVENOUS | Status: DC | PRN
  Administered 2021-11-18: 500 mg via TOPICAL

## 2021-11-18 MED ORDER — ACETAMINOPHEN 500 MG PO TABS
1000.0000 mg | ORAL_TABLET | Freq: Once | ORAL | Status: AC
  Administered 2021-11-18: 1000 mg via ORAL

## 2021-11-18 MED ORDER — LIDOCAINE 2% (20 MG/ML) 5 ML SYRINGE
INTRAMUSCULAR | Status: AC
  Filled 2021-11-18: qty 5

## 2021-11-18 MED ORDER — LACTATED RINGERS IV SOLN: INTRAVENOUS | Status: DC

## 2021-11-18 MED ORDER — ONDANSETRON HCL 4 MG/2ML IJ SOLN
INTRAMUSCULAR | Status: AC
Start: 2021-11-18 — End: ?
  Filled 2021-11-18: qty 2

## 2021-11-18 MED ORDER — BUPIVACAINE-EPINEPHRINE 0.5% -1:200000 IJ SOLN
INTRAMUSCULAR | Status: DC | PRN
  Administered 2021-11-18: 10 mL

## 2021-11-18 MED ORDER — PROPOFOL 500 MG/50ML IV EMUL
INTRAVENOUS | Status: DC | PRN
  Administered 2021-11-18: 75 ug/kg/min via INTRAVENOUS

## 2021-11-18 MED ORDER — TRAMADOL HCL 50 MG PO TABS: 50.0000 mg | ORAL_TABLET | Freq: Four times a day (QID) | ORAL | 0 refills | Status: AC | PRN

## 2021-11-18 MED ORDER — PHENYLEPHRINE HCL (PRESSORS) 10 MG/ML IV SOLN
INTRAVENOUS | Status: DC | PRN
  Administered 2021-11-18: 80 ug via INTRAVENOUS

## 2021-11-18 MED ORDER — PROPOFOL 10 MG/ML IV BOLUS
INTRAVENOUS | Status: DC | PRN
  Administered 2021-11-18 (×2): 20 mg via INTRAVENOUS

## 2021-11-18 MED ORDER — 0.9 % SODIUM CHLORIDE (POUR BTL) OPTIME
TOPICAL | Status: DC | PRN
  Administered 2021-11-18: 1000 mL

## 2021-11-18 MED ORDER — ACETAMINOPHEN 500 MG PO TABS
ORAL_TABLET | ORAL | Status: AC
  Filled 2021-11-18: qty 2

## 2021-11-18 MED ORDER — FENTANYL CITRATE (PF) 100 MCG/2ML IJ SOLN: 25.0000 ug | INTRAMUSCULAR | Status: DC | PRN

## 2021-11-18 MED ORDER — CEFAZOLIN SODIUM-DEXTROSE 2-4 GM/100ML-% IV SOLN
2.0000 g | INTRAVENOUS | Status: AC
  Administered 2021-11-18: 2 g via INTRAVENOUS

## 2021-11-18 MED ORDER — SODIUM CHLORIDE 0.9 % IV SOLN: INTRAVENOUS | Status: DC

## 2021-11-18 MED ORDER — PROPOFOL 500 MG/50ML IV EMUL
INTRAVENOUS | Status: AC
  Filled 2021-11-18: qty 50

## 2021-11-18 SURGICAL SUPPLY — 63 items
APL PRP STRL LF DISP 70% ISPRP (MISCELLANEOUS) ×1
BLADE AVERAGE 25X9 (BLADE) ×1 IMPLANT
BLADE MICRO SAGITTAL (BLADE) IMPLANT
BLADE OSC/SAG .038X5.5 CUT EDG (BLADE) IMPLANT
BLADE SURG 10 STRL SS (BLADE) ×2 IMPLANT
BLADE SURG 15 STRL LF DISP TIS (BLADE) ×1 IMPLANT
BLADE SURG 15 STRL SS (BLADE) ×2
BNDG CMPR 9X4 STRL LF SNTH (GAUZE/BANDAGES/DRESSINGS) ×1
BNDG ELASTIC 4X5.8 VLCR STR LF (GAUZE/BANDAGES/DRESSINGS) ×2 IMPLANT
BNDG ESMARK 4X9 LF (GAUZE/BANDAGES/DRESSINGS) ×2 IMPLANT
BNDG GZE 12X3 1 PLY HI ABS (GAUZE/BANDAGES/DRESSINGS)
BNDG STRETCH GAUZE 3IN X12FT (GAUZE/BANDAGES/DRESSINGS) IMPLANT
BRUSH SCRUB EZ PLAIN DRY (MISCELLANEOUS) IMPLANT
CHLORAPREP W/TINT 26 (MISCELLANEOUS) ×2 IMPLANT
COVER BACK TABLE 60X90IN (DRAPES) ×2 IMPLANT
DRAPE EXTREMITY T 121X128X90 (DISPOSABLE) ×2 IMPLANT
DRAPE OEC MINIVIEW 54X84 (DRAPES) IMPLANT
DRAPE SURG 17X23 STRL (DRAPES) IMPLANT
DRAPE U-SHAPE 47X51 STRL (DRAPES) IMPLANT
DRSG MEPITEL 4X7.2 (GAUZE/BANDAGES/DRESSINGS) ×2 IMPLANT
DRSG PAD ABDOMINAL 8X10 ST (GAUZE/BANDAGES/DRESSINGS) ×1 IMPLANT
ELECT REM PT RETURN 9FT ADLT (ELECTROSURGICAL) ×2
ELECTRODE REM PT RTRN 9FT ADLT (ELECTROSURGICAL) ×1 IMPLANT
GAUZE SPONGE 4X4 12PLY STRL (GAUZE/BANDAGES/DRESSINGS) ×2 IMPLANT
GLOVE BIO SURGEON STRL SZ8 (GLOVE) ×2 IMPLANT
GLOVE BIOGEL PI IND STRL 8 (GLOVE) ×2 IMPLANT
GLOVE BIOGEL PI INDICATOR 8 (GLOVE) ×2
GLOVE ECLIPSE 8.0 STRL XLNG CF (GLOVE) ×2 IMPLANT
GOWN STRL REUS W/ TWL LRG LVL3 (GOWN DISPOSABLE) ×1 IMPLANT
GOWN STRL REUS W/ TWL XL LVL3 (GOWN DISPOSABLE) ×2 IMPLANT
GOWN STRL REUS W/TWL LRG LVL3 (GOWN DISPOSABLE) ×4
GOWN STRL REUS W/TWL XL LVL3 (GOWN DISPOSABLE) ×4
NDL HYPO 25X1 1.5 SAFETY (NEEDLE) IMPLANT
NDL SAFETY ECLIPSE 18X1.5 (NEEDLE) IMPLANT
NEEDLE HYPO 18GX1.5 SHARP (NEEDLE)
NEEDLE HYPO 25X1 1.5 SAFETY (NEEDLE) ×2 IMPLANT
NS IRRIG 1000ML POUR BTL (IV SOLUTION) ×2 IMPLANT
PACK BASIN DAY SURGERY FS (CUSTOM PROCEDURE TRAY) ×2 IMPLANT
PAD CAST 4YDX4 CTTN HI CHSV (CAST SUPPLIES) ×1 IMPLANT
PADDING CAST COTTON 4X4 STRL (CAST SUPPLIES) ×2
PENCIL SMOKE EVACUATOR (MISCELLANEOUS) ×2 IMPLANT
SANITIZER HAND PURELL 535ML FO (MISCELLANEOUS) IMPLANT
SHEET MEDIUM DRAPE 40X70 STRL (DRAPES) ×2 IMPLANT
SLEEVE SCD COMPRESS KNEE MED (STOCKING) ×1 IMPLANT
SPIKE FLUID TRANSFER (MISCELLANEOUS) IMPLANT
SPONGE T-LAP 18X18 ~~LOC~~+RFID (SPONGE) ×2 IMPLANT
STOCKINETTE 6  STRL (DRAPES) ×2
STOCKINETTE 6 STRL (DRAPES) ×1 IMPLANT
SUCTION FRAZIER HANDLE 10FR (MISCELLANEOUS) ×2
SUCTION TUBE FRAZIER 10FR DISP (MISCELLANEOUS) IMPLANT
SUT ETHILON 2 0 FS 18 (SUTURE) IMPLANT
SUT ETHILON 2 0 FSLX (SUTURE) IMPLANT
SUT ETHILON 3 0 PS 1 (SUTURE) ×2 IMPLANT
SUT MNCRL AB 3-0 PS2 18 (SUTURE) IMPLANT
SUT PDS AB 0 CT 36 (SUTURE) IMPLANT
SUT PDS AB 2-0 CT2 27 (SUTURE) IMPLANT
SWAB COLLECTION DEVICE MRSA (MISCELLANEOUS) IMPLANT
SYR BULB EAR ULCER 3OZ GRN STR (SYRINGE) ×2 IMPLANT
SYR CONTROL 10ML LL (SYRINGE) ×1 IMPLANT
TOWEL GREEN STERILE FF (TOWEL DISPOSABLE) ×2 IMPLANT
TRAY DSU PREP LF (CUSTOM PROCEDURE TRAY) IMPLANT
TUBE CONNECTING 20X1/4 (TUBING) ×1 IMPLANT
UNDERPAD 30X36 HEAVY ABSORB (UNDERPADS AND DIAPERS) ×2 IMPLANT

## 2021-11-18 NOTE — Anesthesia Preprocedure Evaluation (Signed)
Anesthesia Evaluation  Patient identified by MRN, date of birth, ID band Patient awake    Reviewed: Allergy & Precautions, NPO status , Patient's Chart, lab work & pertinent test results  History of Anesthesia Complications (+) PONV and history of anesthetic complications  Airway Mallampati: II  TM Distance: >3 FB Neck ROM: Full    Dental  (+) Dental Advisory Given   Pulmonary former smoker,    breath sounds clear to auscultation       Cardiovascular negative cardio ROS   Rhythm:Regular Rate:Normal     Neuro/Psych negative neurological ROS     GI/Hepatic negative GI ROS, Neg liver ROS,   Endo/Other  diabetes, Type 2, Oral Hypoglycemic Agents  Renal/GU negative Renal ROS     Musculoskeletal  (+) Arthritis ,   Abdominal   Peds  Hematology negative hematology ROS (+)   Anesthesia Other Findings   Reproductive/Obstetrics                             Anesthesia Physical Anesthesia Plan  ASA: 2  Anesthesia Plan: MAC   Post-op Pain Management: Tylenol PO (pre-op)* and Minimal or no pain anticipated   Induction:   PONV Risk Score and Plan: 3 and Propofol infusion, Ondansetron and Treatment may vary due to age or medical condition  Airway Management Planned: Natural Airway and Simple Face Mask  Additional Equipment:   Intra-op Plan:   Post-operative Plan:   Informed Consent: I have reviewed the patients History and Physical, chart, labs and discussed the procedure including the risks, benefits and alternatives for the proposed anesthesia with the patient or authorized representative who has indicated his/her understanding and acceptance.       Plan Discussed with: CRNA  Anesthesia Plan Comments:         Anesthesia Quick Evaluation

## 2021-11-18 NOTE — Discharge Instructions (Addendum)
Post Anesthesia Home Care Instructions  Activity: Get plenty of rest for the remainder of the day. A responsible individual must stay with you for 24 hours following the procedure.  For the next 24 hours, DO NOT: -Drive a car -Paediatric nurse -Drink alcoholic beverages -Take any medication unless instructed by your physician -Make any legal decisions or sign important papers.  Meals: Start with liquid foods such as gelatin or soup. Progress to regular foods as tolerated. Avoid greasy, spicy, heavy foods. If nausea and/or vomiting occur, drink only clear liquids until the nausea and/or vomiting subsides. Call your physician if vomiting continues.  Special Instructions/Symptoms: Your throat may feel dry or sore from the anesthesia or the breathing tube placed in your throat during surgery. If this causes discomfort, gargle with warm salt water. The discomfort should disappear within 24 hours.  If you had a scopolamine patch placed behind your ear for the management of post- operative nausea and/or vomiting:  1. The medication in the patch is effective for 72 hours, after which it should be removed.  Wrap patch in a tissue and discard in the trash. Wash hands thoroughly with soap and water. 2. You may remove the patch earlier than 72 hours if you experience unpleasant side effects which may include dry mouth, dizziness or visual disturbances. 3. Avoid touching the patch. Wash your hands with soap and water after contact with the patch.     Wylene Simmer, MD [facility]  Please read the following information regarding your care after surgery.  Medications  You only need a prescription for the narcotic pain medicine (ex. oxycodone, Percocet, Norco).  All of the other medicines listed below are available over the counter. X acetominophen (Tylenol) 650 mg every 4-6 hours as you need for minor to moderate pain X tramadol as prescribed for severe pain  Weight Bearing X Bear weight only on  your operated foot in the post-op shoe.  Cast / Splint / Dressing X  Keep your splint, cast or dressing clean and dry.  Don't put anything (coat hanger, pencil, etc) down inside of it.  If it gets damp, use a hair dryer on the cool setting to dry it.  If it gets soaked, call the office to schedule an appointment for a cast change.  After your dressing, cast or splint is removed; you may shower, but do not soak or scrub the wound.  Allow the water to run over it, and then gently pat it dry.  Swelling It is normal for you to have swelling where you had surgery.  To reduce swelling and pain, keep your toes above your nose for at least 3 days after surgery.  It may be necessary to keep your foot or leg elevated for several weeks.  If it hurts, it should be elevated.  Follow Up Call my office at [phone] when you are discharged from the hospital or surgery center to schedule an appointment to be seen two weeks after surgery.  Call my office at [phone] if you develop a fever >101.5 F, nausea, vomiting, bleeding from the surgical site or severe pain.        Call your surgeon if you experience:   1.  Fever over 101.0. 2.  Inability to urinate. 3.  Nausea and/or vomiting. 4.  Extreme swelling or bruising at the surgical site. 5.  Continued bleeding from the incision. 6.  Increased pain, redness or drainage from the incision. 7.  Problems related to your pain medication. 8.  Any problems and/or concerns

## 2021-11-18 NOTE — H&P (Signed)
Brenda Pham is an 81 y.o. female.   Chief Complaint: Left foot ulcer HPI: 81 year old female with a past medical history significant for diabetes has a long history of left lateral forefoot nonhealing diabetic ulcer.  She has had recurrent bouts of infection and abscess despite antibiotics and local wound care as well as offloading.  She presents today for left fifth ray amputation.  Past Medical History:  Diagnosis Date   Arthritis    osteoarthritis -knees, hands   Cancer ([place])    '80-right breast, surgery and radiation.   Diabetes mellitus without complication ([place])    recently dx. no oral meds or insulin.   Hyperlipidemia    PONV (postoperative nausea and vomiting)     Past Surgical History:  Procedure Laterality Date   APPENDECTOMY     BREAST SURGERY     Rt. breast modified mastectomy/(reconstuction)-last '03 tranflap(tissue from abdomen); and left subcutaneoues mastectomy and implant left breast-last implant '02.   CATARACT EXTRACTION, BILATERAL     last 06-11-14 left   DILATION AND CURETTAGE OF UTERUS     FINGER SURGERY Right    right ring finger-excsion cyst and nodules.   FOOT SURGERY Left    '10- 2nd toe    KNEE ARTHROSCOPY Right    KNEE ARTHROSCOPY Left    scope with hammer toe, '06- scope for torn cartilage   TOTAL KNEE ARTHROPLASTY Right 07/07/2014   Procedure: RIGHT TOTAL KNEE ARTHROPLASTY;  Surgeon: Paralee Cancel, MD;  Location: [facility];  Service: Orthopedics;  Laterality: Right;    Family History  Problem Relation Age of Onset   Allergic rhinitis Daughter    Hypertension Mother    Diabetes Mother    Kidney disease Mother    Social History:  reports that she quit smoking about 43 years ago. Her smoking use included cigarettes. She has a 10.00 pack-year smoking history. She has never used smokeless tobacco. She reports that she does not drink alcohol and does not use drugs.  Allergies:  Allergies  Allergen Reactions   Sulfa Antibiotics Rash   Keflex  [Cephalexin]    Niacin And Related     Extreme flushing, heart racing   Other     "Clindamycin"-Antibiotic caused thrush.    Septra [Sulfamethoxazole-Trimethoprim]     Medications Prior to Admission  Medication Sig Dispense Refill   alendronate (FOSAMAX) 70 MG tablet Take 70 mg by mouth once a week.     metFORMIN (GLUMETZA) 500 MG (MOD) 24 hr tablet Take 500 mg by mouth daily with breakfast.     atorvastatin (LIPITOR) 20 MG tablet Take 20 mg by mouth at bedtime.     naproxen sodium (ALEVE) 220 MG tablet Take 220 mg by mouth.      No results found for this or any previous visit (from the past 48 hour(s)). DG MINI C-ARM IMAGE ONLY  Result Date: 11/18/2021 There is no interpretation for this exam.  This order is for images obtained during a surgical procedure.  Please See "Surgeries" Tab for more information regarding the procedure.    Review of Systems no recent fever, chills, nausea, vomiting or changes in her appetite  Blood pressure 129/76, pulse 86, temperature 97.7 F (36.5 C), temperature source Oral, resp. rate 15, height '5\' 5"'$  (1.651 m), weight 65.6 kg, SpO2 99 %. Physical Exam  Well-nourished well-developed woman in no apparent distress.  Alert and oriented x4.  Normal mood and affect.  Gait is normal.  The left foot has a plantar lateral  ulcer beneath the fifth metatarsal head.  Pulses are palpable in the foot.  Diminished sensibility to light touch at the forefoot.  No lymphangitis.  5 out of 5 strength in plantarflexion and dorsiflexion of the ankle and toes.   Assessment/Plan Nonhealing left foot diabetic ulcer -to the operating room today for left foot fifth ray amputation.  The risks and benefits of the alternative treatment options have been discussed in detail.  The patient wishes to proceed with surgery and specifically understands risks of bleeding, infection, nerve damage, blood clots, need for additional surgery, amputation and death.   Wylene Simmer, MD 09-Dec-2021,  3:10 PM

## 2021-11-18 NOTE — Op Note (Signed)
11/18/2021  4:10 PM  PATIENT:  Brenda Pham  81 y.o. female  PRE-OPERATIVE DIAGNOSIS:  nonhealing left foot diabetic ulcer  POST-OPERATIVE DIAGNOSIS:  same  Procedure(s):  left foot 5th ray amputation  SURGEON:  Wylene Simmer, MD  ASSISTANT: none  ANESTHESIA:   MAC, local  EBL:  minimal   TOURNIQUET:   Total Tourniquet Time Documented: Calf (laterality) - 16 minutes Total: Calf (laterality) - 16 minutes  COMPLICATIONS:  None apparent  DISPOSITION:  Extubated, awake and stable to recovery.  INDICATION FOR PROCEDURE: 81 year old female with a past medical history significant for diabetes has a long history of left plantar diabetic ulcer.  This ulcer has not healed despite extensive wound care and offloading.  She has had recurrent bouts of cellulitis and abscess.  She presents today for fifth ray amputation having failed all nonoperative treatment to date.  The risks and benefits of the alternative treatment options have been discussed in detail.  The patient wishes to proceed with surgery and specifically understands risks of bleeding, infection, nerve damage, blood clots, need for additional surgery, amputation and death.   PROCEDURE IN DETAIL:  After pre operative consent was obtained, and the correct operative site was identified, the patient was brought to the operating room and placed supine on the OR table.  IV sedation was administered.  Pre-operative antibiotics were administered.  A surgical timeout was taken.  The left lower extremity was prepped and draped in standard sterile fashion.  A metatarsal block was performed at the base of the fifth metatarsal with half percent Marcaine with epinephrine.  The left foot was then exsanguinated and an Esmarch tourniquet wrapped around the ankle.  A racquet style incision was marked on the skin around the base of the fifth toe incorporating the plantar ulcer.  The incision was made and dissection carried down through the  subcutaneous tissues.  The toe was disarticulated through the MP joint.  Subperiosteal dissection was then carried along the fifth metatarsal shaft.  The soft tissue's were protected and the oscillating saw used to cut through the fifth metatarsal shaft beveling the cut appropriately.  The cut surface of bone was then smoothed with a rasp.  The wound was irrigated copiously.  There was no evidence of necrosis, purulence or devitalized soft tissue.  The neurovascular bundles were cauterized.  The wound was sprinkled with vancomycin powder.  The skin edges were then approximated with horizontal mattress sutures of 2-0 nylon.  Sterile dressings were applied followed by a compression wrap.  The tourniquet was released after application of the dressings.  The patient was awakened from anesthesia and transported to the recovery room in stable condition.   FOLLOW UP PLAN: Weightbearing as tolerated in a flat postop shoe.  Follow-up in the office in 2 weeks for a wound check and possible suture removal.  No indication for DVT prophylaxis in this ambulatory patient.

## 2021-11-19 NOTE — Transfer of Care (Signed)
Immediate Anesthesia Transfer of Care Note  Patient: Brenda Pham  Procedure(s) Performed: LEFT 5TH AMPUTATION RAY (Left: Foot)  Patient Location: PACU  Anesthesia Type:General  Level of Consciousness: awake, alert  and oriented  Airway & Oxygen Therapy: Patient Spontanous Breathing and Patient connected to face mask oxygen  Post-op Assessment: Report given to RN and Post -op Vital signs reviewed and stable  Post vital signs: Reviewed and stable  Last Vitals:  Vitals Value Taken Time  BP 123/70 11/18/21 1637  Temp 36.6 C 11/18/21 1637  Pulse 82 11/18/21 1637  Resp 16 11/18/21 1637  SpO2 96 % 11/18/21 1637    Last Pain:  Vitals:   11/18/21 1637  TempSrc: Oral  PainSc: 0-No pain      Patients Stated Pain Goal: 6 (10/23/74 2831)  Complications: No notable events documented.

## 2021-11-19 NOTE — Progress Notes (Signed)
Left message stating courtesy call and if any questions or concerns please call the doctors office.  

## 2021-11-19 NOTE — Anesthesia Postprocedure Evaluation (Signed)
Anesthesia Post Note  Patient: Brenda Pham  Procedure(s) Performed: LEFT 5TH AMPUTATION RAY (Left: Foot)     Patient location during evaluation: PACU Anesthesia Type: MAC Level of consciousness: awake and alert Pain management: pain level controlled Vital Signs Assessment: post-procedure vital signs reviewed and stable Respiratory status: spontaneous breathing, nonlabored ventilation, respiratory function stable and patient connected to nasal cannula oxygen Cardiovascular status: stable and blood pressure returned to baseline Postop Assessment: no apparent nausea or vomiting Anesthetic complications: no   No notable events documented.  Last Vitals:  Vitals:   11/18/21 1625 11/18/21 1637  BP:  123/70  Pulse: 78 82  Resp: 15 16  Temp:  36.6 C  SpO2: 100% 96%    Last Pain:  Vitals:   11/18/21 1637  TempSrc: Oral  PainSc: 0-No pain                 Tiajuana Amass

## 2021-11-22 ENCOUNTER — Encounter (HOSPITAL_BASED_OUTPATIENT_CLINIC_OR_DEPARTMENT_OTHER): Payer: Self-pay | Admitting: Orthopedic Surgery

## 2022-05-18 ENCOUNTER — Other Ambulatory Visit: Payer: Self-pay

## 2022-05-18 ENCOUNTER — Telehealth: Payer: Self-pay | Admitting: Pulmonary Disease

## 2022-05-18 DIAGNOSIS — R918 Other nonspecific abnormal finding of lung field: Secondary | ICD-10-CM

## 2022-05-18 NOTE — Telephone Encounter (Signed)
New order has been placed for CT

## 2022-05-18 NOTE — Telephone Encounter (Signed)
Noted  

## 2022-05-26 ENCOUNTER — Ambulatory Visit (HOSPITAL_COMMUNITY)
Admission: RE | Admit: 2022-05-26 | Discharge: 2022-05-26 | Disposition: A | Discharge status: Home / Self Care | Payer: Medicare Other | Source: Ambulatory Visit | Attending: Pulmonary Disease | Admitting: Pulmonary Disease

## 2022-05-26 ENCOUNTER — Telehealth: Payer: Self-pay | Admitting: Pulmonary Disease

## 2022-05-26 ENCOUNTER — Other Ambulatory Visit: Payer: Self-pay | Admitting: Pulmonary Disease

## 2022-05-26 DIAGNOSIS — R918 Other nonspecific abnormal finding of lung field: Secondary | ICD-10-CM | POA: Diagnosis not present

## 2022-05-26 NOTE — Telephone Encounter (Signed)
Spoke with CT dept. They wanted to make sure the correct CT had been ordered for the patient. I reviewed the last CT from 2022 and Dr. Vaughan Browner recommended having a repeat CT without contrast in 2 years. A high res CT was ordered instead. I provided them with a verbal to change the order. Was advised no new order was needed.   Nothing further needed at time of call.

## 2022-07-20 ENCOUNTER — Encounter: Payer: Self-pay | Admitting: Pulmonary Disease

## 2022-07-20 ENCOUNTER — Ambulatory Visit (INDEPENDENT_AMBULATORY_CARE_PROVIDER_SITE_OTHER): Payer: Medicare Other | Admitting: Pulmonary Disease

## 2022-07-20 VITALS — BP 120/72 | HR 69 | Temp 98.0°F | Ht 63.0 in | Wt 146.0 lb

## 2022-07-20 DIAGNOSIS — R918 Other nonspecific abnormal finding of lung field: Secondary | ICD-10-CM

## 2022-07-20 DIAGNOSIS — J849 Interstitial pulmonary disease, unspecified: Secondary | ICD-10-CM

## 2022-07-20 NOTE — Patient Instructions (Addendum)
Will get some labs for further workup of the arthritis Will get a follow-up high-resolution CT in 1 year Return to clinic after CT scan

## 2022-07-20 NOTE — Progress Notes (Signed)
Brenda Pham    KW:[id number]    [birth year]  Primary Care Physician:Elkins, Curt Jews, MD  Referring Physician: Leonard Downing, MD [address] [place],  [place] [address]  Chief complaint: Follow-up for lung nodules  HPI: 82 y.o.  with history of breast cancer, arthritis, diabetes She had a CT scan and PET scan which showed multiple lung nodules with lobe nodule and has been referred here for further evaluation Has occasional nonproductive cough.  Denies any sputum, weight loss, hemoptysis Complains of joint pain and stiffness in the small joints of the hand  Evaluated by Dr. Trudie Reed, [facility] rheumatology in 2019 who felt patient's overall presentation consistent with osteoarthritis.  She has no findings to suggest rheumatoid arthritis.  History of breast cancer diagnosed in 1980s status post radical mastectomy and breast reconstruction.  There is no evidence of recurrence and since then  Pets: No pets Occupation: Retired Network engineer Exposures: No known exposure, no mold, hot tubs, Jacuzzi Smoking history: 10-pack-year smoking history.  Quit in 1980 Travel history: Lived in [place] all her life.  No significant travel Relevant family history: No significant family history of lung issues  Interim history: Seen back in clinic after gap of 2 years.  She recently had a CT scan for follow-up of lung nodules which are stable.  However there is mild centrilobular nodularity which is new.  She denies any exposures.  States that her breathing is well with no issues at all.  Outpatient Encounter Medications as of 07/20/2022  Medication Sig   alendronate (FOSAMAX) 70 MG tablet Take 70 mg by mouth once a week.   atorvastatin (LIPITOR) 20 MG tablet Take 20 mg by mouth at bedtime. (Patient not taking: Reported on 07/20/2022)   metFORMIN (GLUMETZA) 500 MG (MOD) 24 hr tablet Take 500 mg by mouth daily with breakfast. (Patient not taking: Reported on  07/20/2022)   naproxen sodium (ALEVE) 220 MG tablet Take 220 mg by mouth. (Patient not taking: Reported on 07/20/2022)   No facility-administered encounter medications on file as of 07/20/2022.   Physical Exam: Blood pressure 124/80, pulse (!) 103, temperature 98 F (36.7 C), temperature source Temporal, height 5\' 3"  (1.6 m), weight 159 lb 9.6 oz (72.4 kg), SpO2 98 %. Gen:      No acute distress HEENT:  EOMI, sclera anicteric Neck:     No masses; no thyromegaly Lungs:    Clear to auscultation bilaterally; normal respiratory effort CV:         Regular rate and rhythm; no murmurs Abd:      + bowel sounds; soft, non-tender; no palpable masses, no distension Ext:    No edema; adequate peripheral perfusion Skin:      Warm and dry; no rash Neuro: alert and oriented x 3 Psych: normal mood and affect  Data Reviewed: CT scan chest 08/07/2017- 16 mm right middle lobe nodule, multiple scattered subcentimeter pulmonary nodule.  Right middle lobe fibrosis PET scan 08/14/2017- 16 mm right middle lobe nodule has SUV of 2.8.  The other lung nodules are below the limits of detection by PET scan.  Nonspecific uptake in the left pectoral muscle. CT scan 09/22/2017- stable lung nodules with no change in size. CT chest 05/26/2022-generalized subpleural reticulation with ill-defined centrilobular nodularity. I have reviewed the images personally.   X-ray of right and left hand 11/21/2017 Mild erosive and degenerative osteoarthropathy  PFTs 12/04/2017 FVC 2.53 [89%), FEV1 2.01 [95%], F/F 80, TLC 92%,  DLCO 82% Normal test  Labs ANA, CCP, rheumatoid factor 09/08/2017-negative  [employer] study 12/27/2017 Test results shows very low risk about 4% risk of malignancy. Negative predictive value 96%  Assessment:  Multiple lung nodules The largest RML nodule has mild uptake. PET scan does not show any other uptake and she has minimal smoking history.  History also noted for breast cancer with no recurrence since 1980.   Differential diagnosis include slow-growing tumor or carcinoid.  Her latest CT scan shows stability of lung nodules from 2019 to now.  This is reassuring.  Interstitial changes Her last scan shows some nonspecific reticulation and minimal centrilobular nodularity.  This may suggest hypersensitivity pneumonitis but she denies any exposures or any symptoms.  As she is doing well clinically we will continue to observe and manage conservatively.  Order follow-up CT high-resolution in 1 year  Check baseline CTD serologies and hypersensitivity panel  Plan/Recommendations: High resolution CT in 1 year  Marshell Garfinkel MD [facility] 07/20/2022, 3:26 PM  CC: Leonard Downing, *

## 2022-07-23 LAB — ANTI-NUCLEAR AB-TITER (ANA TITER): ANA Titer 1: 1:40 {titer} — ABNORMAL HIGH

## 2022-07-23 LAB — RHEUMATOID FACTOR: Rheumatoid fact SerPl-aCnc: 15 IU/mL — ABNORMAL HIGH (ref <14)

## 2022-07-23 LAB — CYCLIC CITRUL PEPTIDE ANTIBODY, IGG: Cyclic Citrullin Peptide Ab: <16 UNITS

## 2022-07-23 LAB — ANA: Anti Nuclear Antibody (ANA): POSITIVE — AB

## 2022-07-23 LAB — ANTI-DNA ANTIBODY, DOUBLE-STRANDED: ds DNA Ab: <1 IU/mL

## 2022-07-26 LAB — HYPERSENSITIVITY PNEUMONITIS
A. Pullulans Abs: NEGATIVE
A.Fumigatus #1 Abs: NEGATIVE
Micropolyspora faeni, IgG: NEGATIVE
Pigeon Serum Abs: NEGATIVE
Thermoact. Saccharii: NEGATIVE
Thermoactinomyces vulgaris, IgG: NEGATIVE

## 2022-09-16 ENCOUNTER — Other Ambulatory Visit: Payer: Self-pay

## 2022-09-16 ENCOUNTER — Emergency Department (HOSPITAL_BASED_OUTPATIENT_CLINIC_OR_DEPARTMENT_OTHER): Payer: Medicare Other

## 2022-09-16 ENCOUNTER — Emergency Department (HOSPITAL_BASED_OUTPATIENT_CLINIC_OR_DEPARTMENT_OTHER): Payer: Medicare Other | Admitting: Radiology

## 2022-09-16 ENCOUNTER — Inpatient Hospital Stay (HOSPITAL_BASED_OUTPATIENT_CLINIC_OR_DEPARTMENT_OTHER)
Admission: EM | Admit: 2022-09-16 | Discharge: 2022-09-21 | DRG: 071 | Disposition: A | Discharge status: Skilled Nursing Facility | Payer: Medicare Other | Attending: Internal Medicine | Admitting: Internal Medicine

## 2022-09-16 ENCOUNTER — Encounter (HOSPITAL_BASED_OUTPATIENT_CLINIC_OR_DEPARTMENT_OTHER): Payer: Self-pay | Admitting: Emergency Medicine

## 2022-09-16 DIAGNOSIS — G9341 Metabolic encephalopathy: Secondary | ICD-10-CM | POA: Diagnosis not present

## 2022-09-16 DIAGNOSIS — Z7983 Long term (current) use of bisphosphonates: Secondary | ICD-10-CM

## 2022-09-16 DIAGNOSIS — E1142 Type 2 diabetes mellitus with diabetic polyneuropathy: Secondary | ICD-10-CM | POA: Diagnosis present

## 2022-09-16 DIAGNOSIS — R262 Difficulty in walking, not elsewhere classified: Secondary | ICD-10-CM | POA: Diagnosis present

## 2022-09-16 DIAGNOSIS — K5900 Constipation, unspecified: Secondary | ICD-10-CM | POA: Diagnosis present

## 2022-09-16 DIAGNOSIS — E785 Hyperlipidemia, unspecified: Secondary | ICD-10-CM | POA: Diagnosis present

## 2022-09-16 DIAGNOSIS — Z79899 Other long term (current) drug therapy: Secondary | ICD-10-CM

## 2022-09-16 DIAGNOSIS — Z841 Family history of disorders of kidney and ureter: Secondary | ICD-10-CM

## 2022-09-16 DIAGNOSIS — R4189 Other symptoms and signs involving cognitive functions and awareness: Secondary | ICD-10-CM

## 2022-09-16 DIAGNOSIS — R531 Weakness: Secondary | ICD-10-CM

## 2022-09-16 DIAGNOSIS — E860 Dehydration: Secondary | ICD-10-CM | POA: Diagnosis present

## 2022-09-16 DIAGNOSIS — Z86010 Personal history of colonic polyps: Secondary | ICD-10-CM

## 2022-09-16 DIAGNOSIS — Z833 Family history of diabetes mellitus: Secondary | ICD-10-CM

## 2022-09-16 DIAGNOSIS — Z886 Allergy status to analgesic agent status: Secondary | ICD-10-CM

## 2022-09-16 DIAGNOSIS — D696 Thrombocytopenia, unspecified: Secondary | ICD-10-CM | POA: Diagnosis present

## 2022-09-16 DIAGNOSIS — Z888 Allergy status to other drugs, medicaments and biological substances status: Secondary | ICD-10-CM

## 2022-09-16 DIAGNOSIS — Z87891 Personal history of nicotine dependence: Secondary | ICD-10-CM

## 2022-09-16 DIAGNOSIS — Z853 Personal history of malignant neoplasm of breast: Secondary | ICD-10-CM

## 2022-09-16 DIAGNOSIS — K648 Other hemorrhoids: Secondary | ICD-10-CM | POA: Diagnosis present

## 2022-09-16 DIAGNOSIS — R54 Age-related physical debility: Secondary | ICD-10-CM | POA: Diagnosis present

## 2022-09-16 DIAGNOSIS — Z881 Allergy status to other antibiotic agents status: Secondary | ICD-10-CM

## 2022-09-16 DIAGNOSIS — M1990 Unspecified osteoarthritis, unspecified site: Secondary | ICD-10-CM | POA: Diagnosis present

## 2022-09-16 DIAGNOSIS — E119 Type 2 diabetes mellitus without complications: Secondary | ICD-10-CM

## 2022-09-16 DIAGNOSIS — R443 Hallucinations, unspecified: Secondary | ICD-10-CM | POA: Diagnosis present

## 2022-09-16 DIAGNOSIS — Z882 Allergy status to sulfonamides status: Secondary | ICD-10-CM

## 2022-09-16 DIAGNOSIS — G9340 Encephalopathy, unspecified: Principal | ICD-10-CM

## 2022-09-16 DIAGNOSIS — D709 Neutropenia, unspecified: Secondary | ICD-10-CM | POA: Diagnosis present

## 2022-09-16 DIAGNOSIS — Z7984 Long term (current) use of oral hypoglycemic drugs: Secondary | ICD-10-CM

## 2022-09-16 DIAGNOSIS — Z96651 Presence of right artificial knee joint: Secondary | ICD-10-CM | POA: Diagnosis present

## 2022-09-16 DIAGNOSIS — E538 Deficiency of other specified B group vitamins: Secondary | ICD-10-CM | POA: Diagnosis present

## 2022-09-16 DIAGNOSIS — Z8249 Family history of ischemic heart disease and other diseases of the circulatory system: Secondary | ICD-10-CM

## 2022-09-16 DIAGNOSIS — M810 Age-related osteoporosis without current pathological fracture: Secondary | ICD-10-CM | POA: Diagnosis present

## 2022-09-16 LAB — COMPREHENSIVE METABOLIC PANEL
ALT: 31 U/L (ref 0–44)
AST: 42 U/L — ABNORMAL HIGH (ref 15–41)
Albumin: 4.6 g/dL (ref 3.5–5.0)
Alkaline Phosphatase: 57 U/L (ref 38–126)
Anion gap: 10 (ref 5–15)
BUN: 19 mg/dL (ref 8–23)
CO2: 26 mmol/L (ref 22–32)
Calcium: 10.4 mg/dL — ABNORMAL HIGH (ref 8.9–10.3)
Chloride: 99 mmol/L (ref 98–111)
Creatinine, Ser: 0.84 mg/dL (ref 0.44–1.00)
GFR, Estimated: >60 mL/min (ref >60)
Glucose, Bld: 96 mg/dL (ref 70–99)
Potassium: 4.1 mmol/L (ref 3.5–5.1)
Sodium: 135 mmol/L (ref 135–145)
Total Bilirubin: 0.5 mg/dL (ref 0.3–1.2)
Total Protein: 7.3 g/dL (ref 6.5–8.1)

## 2022-09-16 LAB — CBC
HCT: 42 % (ref 36.0–46.0)
Hemoglobin: 14.4 g/dL (ref 12.0–15.0)
MCH: 29.5 pg (ref 26.0–34.0)
MCHC: 34.3 g/dL (ref 30.0–36.0)
MCV: 86.1 fL (ref 80.0–100.0)
Platelets: 121 10*3/uL — ABNORMAL LOW (ref 150–400)
RBC: 4.88 MIL/uL (ref 3.87–5.11)
RDW: 12.7 % (ref 11.5–15.5)
WBC: 3.2 10*3/uL — ABNORMAL LOW (ref 4.0–10.5)
nRBC: 0 % (ref 0.0–0.2)

## 2022-09-16 LAB — CBG MONITORING, ED: Glucose-Capillary: 78 mg/dL (ref 70–99)

## 2022-09-16 MED ORDER — SODIUM CHLORIDE 0.9 % IV BOLUS
1000.0000 mL | Freq: Once | INTRAVENOUS | Status: AC
  Administered 2022-09-16: 1000 mL via INTRAVENOUS

## 2022-09-16 NOTE — ED Triage Notes (Addendum)
Pt presents to ED POV. Per daughter pt has been intermittently disoriented since last week. Pt typically live independently and A&O x4. Disoriented x2 to situation and place. Daughter reports that she had improved since this afternoon, that she was unable to form sentences. Last seen normal tuesday

## 2022-09-16 NOTE — ED Notes (Signed)
Patient transported to CT 

## 2022-09-17 ENCOUNTER — Encounter (HOSPITAL_COMMUNITY): Payer: Self-pay | Admitting: Internal Medicine

## 2022-09-17 ENCOUNTER — Inpatient Hospital Stay (HOSPITAL_COMMUNITY): Payer: Medicare Other

## 2022-09-17 DIAGNOSIS — D709 Neutropenia, unspecified: Secondary | ICD-10-CM | POA: Diagnosis present

## 2022-09-17 DIAGNOSIS — M810 Age-related osteoporosis without current pathological fracture: Secondary | ICD-10-CM | POA: Diagnosis present

## 2022-09-17 DIAGNOSIS — K648 Other hemorrhoids: Secondary | ICD-10-CM | POA: Diagnosis present

## 2022-09-17 DIAGNOSIS — R4182 Altered mental status, unspecified: Secondary | ICD-10-CM | POA: Diagnosis not present

## 2022-09-17 DIAGNOSIS — R54 Age-related physical debility: Secondary | ICD-10-CM | POA: Diagnosis present

## 2022-09-17 DIAGNOSIS — E785 Hyperlipidemia, unspecified: Secondary | ICD-10-CM | POA: Diagnosis present

## 2022-09-17 DIAGNOSIS — G9340 Encephalopathy, unspecified: Secondary | ICD-10-CM | POA: Diagnosis present

## 2022-09-17 DIAGNOSIS — Z7983 Long term (current) use of bisphosphonates: Secondary | ICD-10-CM | POA: Diagnosis not present

## 2022-09-17 DIAGNOSIS — G9341 Metabolic encephalopathy: Secondary | ICD-10-CM | POA: Diagnosis present

## 2022-09-17 DIAGNOSIS — Z86010 Personal history of colonic polyps: Secondary | ICD-10-CM | POA: Diagnosis not present

## 2022-09-17 DIAGNOSIS — Z882 Allergy status to sulfonamides status: Secondary | ICD-10-CM | POA: Diagnosis not present

## 2022-09-17 DIAGNOSIS — D696 Thrombocytopenia, unspecified: Secondary | ICD-10-CM | POA: Diagnosis present

## 2022-09-17 DIAGNOSIS — E860 Dehydration: Secondary | ICD-10-CM | POA: Diagnosis present

## 2022-09-17 DIAGNOSIS — Z853 Personal history of malignant neoplasm of breast: Secondary | ICD-10-CM | POA: Diagnosis not present

## 2022-09-17 DIAGNOSIS — Z8249 Family history of ischemic heart disease and other diseases of the circulatory system: Secondary | ICD-10-CM | POA: Diagnosis not present

## 2022-09-17 DIAGNOSIS — Z886 Allergy status to analgesic agent status: Secondary | ICD-10-CM | POA: Diagnosis not present

## 2022-09-17 DIAGNOSIS — R443 Hallucinations, unspecified: Secondary | ICD-10-CM | POA: Diagnosis present

## 2022-09-17 DIAGNOSIS — K5900 Constipation, unspecified: Secondary | ICD-10-CM | POA: Diagnosis present

## 2022-09-17 DIAGNOSIS — Z833 Family history of diabetes mellitus: Secondary | ICD-10-CM | POA: Diagnosis not present

## 2022-09-17 DIAGNOSIS — R569 Unspecified convulsions: Secondary | ICD-10-CM | POA: Diagnosis not present

## 2022-09-17 DIAGNOSIS — Z841 Family history of disorders of kidney and ureter: Secondary | ICD-10-CM | POA: Diagnosis not present

## 2022-09-17 DIAGNOSIS — Z96651 Presence of right artificial knee joint: Secondary | ICD-10-CM | POA: Diagnosis present

## 2022-09-17 DIAGNOSIS — E1142 Type 2 diabetes mellitus with diabetic polyneuropathy: Secondary | ICD-10-CM | POA: Diagnosis present

## 2022-09-17 DIAGNOSIS — M1990 Unspecified osteoarthritis, unspecified site: Secondary | ICD-10-CM | POA: Diagnosis present

## 2022-09-17 DIAGNOSIS — Z888 Allergy status to other drugs, medicaments and biological substances status: Secondary | ICD-10-CM | POA: Diagnosis not present

## 2022-09-17 DIAGNOSIS — Z87891 Personal history of nicotine dependence: Secondary | ICD-10-CM | POA: Diagnosis not present

## 2022-09-17 DIAGNOSIS — E119 Type 2 diabetes mellitus without complications: Secondary | ICD-10-CM

## 2022-09-17 LAB — COMPREHENSIVE METABOLIC PANEL
ALT: 33 U/L (ref 0–44)
AST: 39 U/L (ref 15–41)
Albumin: 3.6 g/dL (ref 3.5–5.0)
Alkaline Phosphatase: 51 U/L (ref 38–126)
Anion gap: 8 (ref 5–15)
BUN: 13 mg/dL (ref 8–23)
CO2: 23 mmol/L (ref 22–32)
Calcium: 9 mg/dL (ref 8.9–10.3)
Chloride: 103 mmol/L (ref 98–111)
Creatinine, Ser: 0.69 mg/dL (ref 0.44–1.00)
GFR, Estimated: >60 mL/min (ref >60)
Glucose, Bld: 113 mg/dL — ABNORMAL HIGH (ref 70–99)
Potassium: 3.9 mmol/L (ref 3.5–5.1)
Sodium: 134 mmol/L — ABNORMAL LOW (ref 135–145)
Total Bilirubin: 0.7 mg/dL (ref 0.3–1.2)
Total Protein: 6.2 g/dL — ABNORMAL LOW (ref 6.5–8.1)

## 2022-09-17 LAB — MAGNESIUM: Magnesium: 1.9 mg/dL (ref 1.7–2.4)

## 2022-09-17 LAB — GLUCOSE, CAPILLARY
Glucose-Capillary: 114 mg/dL — ABNORMAL HIGH (ref 70–99)
Glucose-Capillary: 115 mg/dL — ABNORMAL HIGH (ref 70–99)

## 2022-09-17 LAB — AMMONIA: Ammonia: 23 umol/L (ref 9–35)

## 2022-09-17 LAB — URINALYSIS, ROUTINE W REFLEX MICROSCOPIC
Bilirubin Urine: NEGATIVE
Glucose, UA: NEGATIVE mg/dL
Hgb urine dipstick: NEGATIVE
Ketones, ur: NEGATIVE mg/dL
Leukocytes,Ua: NEGATIVE
Nitrite: NEGATIVE
Protein, ur: NEGATIVE mg/dL
Specific Gravity, Urine: 1.009 (ref 1.005–1.030)
pH: 5.5 (ref 5.0–8.0)

## 2022-09-17 LAB — CBC
HCT: 41.9 % (ref 36.0–46.0)
Hemoglobin: 14.1 g/dL (ref 12.0–15.0)
MCH: 29.4 pg (ref 26.0–34.0)
MCHC: 33.7 g/dL (ref 30.0–36.0)
MCV: 87.5 fL (ref 80.0–100.0)
Platelets: 109 10*3/uL — ABNORMAL LOW (ref 150–400)
RBC: 4.79 MIL/uL (ref 3.87–5.11)
RDW: 12.8 % (ref 11.5–15.5)
WBC: 2.1 10*3/uL — ABNORMAL LOW (ref 4.0–10.5)
nRBC: 0 % (ref 0.0–0.2)

## 2022-09-17 LAB — VITAMIN B12: Vitamin B-12: 206 pg/mL (ref 180–914)

## 2022-09-17 LAB — FOLATE: Folate: 15.2 ng/mL (ref >5.9)

## 2022-09-17 LAB — ETHANOL: Alcohol, Ethyl (B): <10 mg/dL (ref <10)

## 2022-09-17 LAB — LIPASE, BLOOD: Lipase: 42 U/L (ref 11–51)

## 2022-09-17 LAB — TSH: TSH: 1.951 u[IU]/mL (ref 0.350–4.500)

## 2022-09-17 LAB — PHOSPHORUS: Phosphorus: 3.6 mg/dL (ref 2.5–4.6)

## 2022-09-17 MED ORDER — ONDANSETRON HCL 4 MG PO TABS: 4.0000 mg | ORAL_TABLET | Freq: Four times a day (QID) | ORAL | Status: DC | PRN

## 2022-09-17 MED ORDER — ACETAMINOPHEN 650 MG RE SUPP: 650.0000 mg | Freq: Four times a day (QID) | RECTAL | Status: DC | PRN

## 2022-09-17 MED ORDER — ACETAMINOPHEN 325 MG PO TABS
650.0000 mg | ORAL_TABLET | Freq: Four times a day (QID) | ORAL | Status: DC | PRN
  Administered 2022-09-19 – 2022-09-21 (×2): 650 mg via ORAL
  Filled 2022-09-17 (×2): qty 2

## 2022-09-17 MED ORDER — FA-PYRIDOXINE-CYANOCOBALAMIN 2.5-25-2 MG PO TABS
1.0000 | ORAL_TABLET | Freq: Every day | ORAL | Status: DC
  Administered 2022-09-18 – 2022-09-21 (×4): 1 via ORAL
  Filled 2022-09-17 (×4): qty 1

## 2022-09-17 MED ORDER — LORAZEPAM 2 MG/ML IJ SOLN
0.5000 mg | Freq: Once | INTRAMUSCULAR | Status: AC
  Administered 2022-09-17: 0.5 mg via INTRAVENOUS
  Filled 2022-09-17: qty 1

## 2022-09-17 MED ORDER — ONDANSETRON HCL 4 MG/2ML IJ SOLN: 4.0000 mg | Freq: Four times a day (QID) | INTRAMUSCULAR | Status: DC | PRN

## 2022-09-17 MED ORDER — ENOXAPARIN SODIUM 40 MG/0.4ML IJ SOSY: 40.0000 mg | PREFILLED_SYRINGE | INTRAMUSCULAR | Status: DC

## 2022-09-17 MED ORDER — SODIUM CHLORIDE 0.9 % IV SOLN: INTRAVENOUS | Status: AC

## 2022-09-17 MED ORDER — CYANOCOBALAMIN 1000 MCG/ML IJ SOLN
1000.0000 ug | Freq: Once | INTRAMUSCULAR | Status: AC
  Administered 2022-09-17: 1000 ug via INTRAMUSCULAR
  Filled 2022-09-17: qty 1

## 2022-09-17 NOTE — H&P (Signed)
History and Physical    Patient: Brenda Pham DOB: 12/22/1940 DOA: 09/16/2022 DOS: the patient was seen and examined on 09/17/2022 PCP: Kaleen Mask, MD  Patient coming from: Home  Chief Complaint:  Chief Complaint  Patient presents with   Altered Mental Status   HPI: Brenda Pham is a 82 y.o. female with medical history significant of osteoarthritis, breast cancer, type 2 diabetes, hyperlipidemia,, internal hemorrhoids, colon polyps, left foot diabetic ulcer, abscess to left foot who was brought to the emergency department due to intermittent disorientation for the past week.  The patient lives independently and is usually oriented x 4 according to her daughter.  On arrival to the emergency department she was disoriented to situation and place.  She was last seen normal on Tuesday.  She is able to answer simple questions, but is unable to elaborate.  She denied headache, abdominal, back or chest pain at the time of my examination.  No fever, cough or diarrhea.  ED course: Initial vital signs were temperature 98.2 F, pulse 82, respiration 15, BP 105/66 mmHg O2 sat 100% on room air.  The patient received 1000 mL of normal saline bolus.  Lab work: Her urinalysis was normal.  CBC showed a white count of 3.2, hemoglobin 14.4 g/dL platelets 213.  CMP with a calcium of 10.4 mg deciliter and AST of 42 units/L.  The rest of the CMP measurements were normal.  Lipase, ammonia, alcohol, magnesium and phosphorus were normal.  Imaging: Portable 1 view chest radiograph with no acute radiographic findings.  Stable right middle lobe nodule.  COPD, chronic interstitial change and aortic atherosclerosis.  CT head without contrast no acute intracranial hemorrhage or infarct.  Mild senescent change.   Review of Systems: As mentioned in the history of present illness. All other systems reviewed and are negative. Past Medical History:  Diagnosis Date   Arthritis     osteoarthritis -knees, hands   Cancer ([facility])    '80-right breast, surgery and radiation.   Diabetes mellitus without complication ([facility])    recently dx. no oral meds or insulin.   Hyperlipidemia    PONV (postoperative nausea and vomiting)    Past Surgical History:  Procedure Laterality Date   AMPUTATION Left 11/18/2021   Procedure: LEFT 5TH AMPUTATION RAY;  Surgeon: Toni Arthurs, MD;  Location: [facility];  Service: Orthopedics;  Laterality: Left;  local by surgeon 30 okay per Tammy   APPENDECTOMY     BREAST SURGERY     Rt. breast modified mastectomy/(reconstuction)-last '03 tranflap(tissue from abdomen); and left subcutaneoues mastectomy and implant left breast-last implant '02.   CATARACT EXTRACTION, BILATERAL     last 06-11-14 left   DILATION AND CURETTAGE OF UTERUS     FINGER SURGERY Right    right ring finger-excsion cyst and nodules.   FOOT SURGERY Left    '10- 2nd toe    KNEE ARTHROSCOPY Right    KNEE ARTHROSCOPY Left    scope with hammer toe, '06- scope for torn cartilage   TOTAL KNEE ARTHROPLASTY Right 07/07/2014   Procedure: RIGHT TOTAL KNEE ARTHROPLASTY;  Surgeon: Durene Romans, MD;  Location: [facility];  Service: Orthopedics;  Laterality: Right;   Social History:  reports that she quit smoking about 44 years ago. Her smoking use included cigarettes. She has a 10.00 pack-year smoking history. She has never used smokeless tobacco. She reports that she does not drink alcohol and does not use drugs.  Allergies  Allergen Reactions  Sulfa Antibiotics Rash   Keflex [Cephalexin]    Niacin And Related     Extreme flushing, heart racing   Other     "Clindamycin"-Antibiotic caused thrush.    Septra [Sulfamethoxazole-Trimethoprim]     Family History  Problem Relation Age of Onset   Allergic rhinitis Daughter    Hypertension Mother    Diabetes Mother    Kidney disease Mother     Prior to Admission medications   Medication Sig Start Date End Date Taking?  Authorizing Provider  alendronate (FOSAMAX) 70 MG tablet Take 70 mg by mouth once a week. 06/06/20   [provider]  atorvastatin (LIPITOR) 20 MG tablet Take 20 mg by mouth at bedtime. Patient not taking: Reported on 07/20/2022    [provider]  metFORMIN (GLUMETZA) 500 MG (MOD) 24 hr tablet Take 500 mg by mouth daily with breakfast. Patient not taking: Reported on 07/20/2022    [provider]  naproxen sodium (ALEVE) 220 MG tablet Take 220 mg by mouth. Patient not taking: Reported on 07/20/2022    [provider]    Physical Exam: Vitals:   09/17/22 0600 09/17/22 0618 09/17/22 0630 09/17/22 0831  BP: 121/69  109/66 125/69  Pulse: 81  84 96  Resp: 17  (!) 21 20  Temp:  97.7 F (36.5 C)  98.9 F (37.2 C)  TempSrc:  Oral  Oral  SpO2: 96%  95% 95%   Physical Exam Vitals and nursing note reviewed.  Constitutional:      General: She is awake. She is not in acute distress.    Appearance: Normal appearance.  HENT:     Head: Normocephalic.     Nose: No rhinorrhea.     Mouth/Throat:     Mouth: Mucous membranes are dry.  Eyes:     General: No scleral icterus.    Pupils: Pupils are equal, round, and reactive to light.  Neck:     Vascular: No JVD.  Cardiovascular:     Rate and Rhythm: Normal rate and regular rhythm.     Heart sounds: S1 normal and S2 normal.  Pulmonary:     Effort: Pulmonary effort is normal.     Breath sounds: Normal breath sounds.  Abdominal:     General: Abdomen is flat.     Palpations: Abdomen is soft.  Musculoskeletal:     Cervical back: Neck supple.     Right lower leg: No edema.     Left lower leg: No edema.  Skin:    General: Skin is warm and dry.  Neurological:     General: No focal deficit present.     Mental Status: She is alert. She is disoriented.  Psychiatric:        Mood and Affect: Mood normal.        Behavior: Behavior is cooperative.   Data Reviewed:  Results are pending, will review when  available.  Assessment and Plan: Principal Problem:   Acute metabolic encephalopathy No obvious etiology. Observation/MedSurg. Supportive care. Continue neuro checks. Check B12 level. Check TSH level. Check MRI of brain.  Active Problems:   Hyperlipidemia Currently not on medical therapy. Follow-up with primary care provider.    Peripheral sensory neuropathy due to    type 2 diabetes mellitus ([facility]) No acute findings at this time. Analgesics as needed.    Type 2 diabetes mellitus ([facility]) Not on medications. Has not been eating much. Carbohydrate modified diet. Check hemoglobin A1c. CBG monitoring every 4 hours.  Neutropenia ([facility]CC) Monitor WBC count. B12 low normal level. We will start supplementation.    Thrombocytopenia ([facility]CC) As above for neutropenia. Check platelet count in the morning.    Advance Care Planning:   Code Status: Full Code   Consults:   Family Communication: Spoke to her daughter Alvis Lemmings over the phone.  Severity of Illness: The appropriate patient status for this patient is INPATIENT. Inpatient status is judged to be reasonable and necessary in order to provide the required intensity of service to ensure the patient's safety. The patient's presenting symptoms, physical exam findings, and initial radiographic and laboratory data in the context of their chronic comorbidities is felt to place them at high risk for further clinical deterioration. Furthermore, it is not anticipated that the patient will be medically stable for discharge from the hospital within 2 midnights of admission.   * I certify that at the point of admission it is my clinical judgment that the patient will require inpatient hospital care spanning beyond 2 midnights from the point of admission due to high intensity of service, high risk for further deterioration and high frequency of surveillance required.*  Author: Bobette Mo, MD 09/17/2022 8:46 AM  For on call review  [email or web address].   This document was prepared using Dragon voice recognition software and may contain some unintended transcription errors.

## 2022-09-17 NOTE — ED Notes (Addendum)
Report received from [facility]. Patient resting quietly in stretcher, respirations even, unlabored, no acute distress noted. Family at bedside.  Lights dimmed for patient comfort.

## 2022-09-17 NOTE — ED Notes (Signed)
Attempted to call pt's daughter with no answer.

## 2022-09-17 NOTE — ED Notes (Signed)
Patient had episode of urinary incontinence. Linens and gown changed. Patient remained with eyes closed and no verbal response while changing her. Per daughter, patient lives alone, responsible for own ADLs and does not wear depends.

## 2022-09-17 NOTE — Progress Notes (Signed)
  Transition of Care [facility] Screening Note   Patient Details  Name: ANELIS WAVRA Date of Birth: [birth year]   Transition of Care [facility]) CM/SW Contact:    Adrian Prows, RN Phone Number: 09/17/2022, 3:30 PM    Transition of Care Department [facility]) has reviewed patient and no TOC needs have been identified at this time. We will continue to monitor patient advancement through interdisciplinary progression rounds. If new patient transition needs arise, please place a TOC consult.

## 2022-09-17 NOTE — Progress Notes (Signed)
Hospitalist Transfer Note:  Transferring facility: [facility] Requesting provider: Dr. Eudelia Bunch (EDP at [facility]) Reason for transfer: admission for further evaluation and management of acute encephalopathy/generalized weakness.     82 year old female with medical history notable for osteoporosis for which she is on alendronate, who presented to [facility] ED for evaluation of 1 week of confusion, lethargy, fatigue, generalized weakness, increased somnolence.  History provided by the daughter present at bedside.  No recent fever.  Labs were notable for mildly elevated calcium.  Urinalysis not suggestive of UTI  Imaging notable for CT head showed no evidence of acute process; chest x-ray also showed no evidence of acute process.  Subsequently, I accepted this patient for transfer for inpatient admission to a med-surg bed at [facility] or [facility]  (first available)  for further work-up and management of the above.        Nursing staff, Please call [facility] Admits & Consults System-Wide number on Amion [phone]) as soon as patient's arrival, so appropriate admitting provider can evaluate the pt.     Newton Pigg, DO Hospitalist

## 2022-09-17 NOTE — ED Notes (Signed)
Patient resting quietly in stretcher, respirations even, unlabored, no acute distress noted.  

## 2022-09-17 NOTE — ED Provider Notes (Signed)
[place] EMERGENCY DEPARTMENT AT [facility] Provider Note  CSN: [id number] Arrival date & time: 09/16/22 2121  Chief Complaint(s) Altered Mental Status  HPI Brenda Pham is a 82 y.o. female who presents to the emergency department for altered mental status.  She is accompanied by the daughter who is providing most of the history.  Daughter reports that the patient lives alone and then they will.  At baseline, patient is able to care for herself.  She is extremely social and very friendly.  She and the daughter speak 2-3 times a week.  Daughter reported that over the past 1 to 2 weeks, she has noted that the patient's behavior has been changing.  She noted that the patient missed her weekly hair salon appointments twice which is extremely unusual.  For Mother's Day, the patient was brought to [place] to spend the weekend.  Daughter reported that the patient was generally weak and had difficulty ambulating requiring assistance and walker.  The daughter thought it might be due to the the patient being dehydrated and having decreased appetite.  She did have mild improvements while being at the daughter's home.  Patient was taken back to her house earlier in the week.  When the daughter called later in the week, she noted that the patient was more confused than had difficulty with word finding.  She denied any known recent infections.  No reported vomiting or diarrhea.  No known alcohol or illicit drug use.  No known trauma.  The only medicine that patient takes is alendronate.  The history is provided by the patient.    Past Medical History Past Medical History:  Diagnosis Date   Arthritis    osteoarthritis -knees, hands   Cancer (HCC)    '80-right breast, surgery and radiation.   Diabetes mellitus without complication (HCC)    recently dx. no oral meds or insulin.   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 Authorizing Provider  alendronate (FOSAMAX) 70 MG tablet Take 70 mg by mouth once a week. 06/06/20   [provider]  atorvastatin (LIPITOR) 20 MG tablet Take 20 mg by mouth at bedtime. Patient not taking: Reported on 07/20/2022    [provider]  metFORMIN (GLUMETZA) 500 MG (MOD) 24 hr tablet Take 500 mg by mouth daily with breakfast. Patient not taking: Reported on 07/20/2022    [provider]  naproxen sodium (ALEVE) 220 MG tablet Take 220 mg by mouth. Patient not taking: Reported on 07/20/2022    [provider]  Allergies Sulfa antibiotics, Keflex [cephalexin], Niacin and related, Other, and Septra [sulfamethoxazole-trimethoprim]  Review of Systems Review of Systems As noted in HPI  Physical Exam Vital Signs  I have reviewed the triage vital signs BP 121/69   Pulse 81   Temp 97.7 F (36.5 C) (Oral)   Resp 17   SpO2 96%   Physical Exam Vitals reviewed.  Constitutional:      General: She is not in acute distress.    Appearance: She is well-developed. She is not diaphoretic.  HENT:     Head: Normocephalic and atraumatic.     Nose: Nose normal.  Eyes:     General: No scleral icterus.       Right eye: No discharge.        Left eye: No discharge.     Conjunctiva/sclera: Conjunctivae normal.     Pupils: Pupils are equal, round, and reactive to light.  Cardiovascular:     Rate and Rhythm: Normal rate and regular rhythm.     Heart sounds: No murmur heard.    No friction rub. No gallop.  Pulmonary:     Effort: Pulmonary effort is normal. No  respiratory distress.     Breath sounds: Normal breath sounds. No stridor. No rales.  Abdominal:     General: There is no distension.     Palpations: Abdomen is soft.     Tenderness: There is no abdominal tenderness.  Musculoskeletal:        General: No tenderness.     Cervical back: Normal range of motion and neck supple.  Skin:    General: Skin is warm and dry.     Findings: No erythema or rash.  Neurological:     Mental Status: She is alert. She is disoriented.     Comments: Mental Status:  Alert and oriented to person, and place. Attention and concentration slowed Speech clear.  Recent memory is impaired  Cranial Nerves:  II Visual Fields: Intact to confrontation. Visual fields intact. III, IV, VI: Pupils equal and reactive to light and near. Full eye movement without nystagmus  V Facial Sensation: Normal. No weakness of masticatory muscles  VII: No facial weakness or asymmetry  VIII Auditory Acuity: Grossly normal  IX/X: The uvula is midline; the palate elevates symmetrically  XI: Normal sternocleidomastoid and trapezius strength  XII: The tongue is midline. No atrophy or fasciculations.   Motor System: Muscle Strength: 5/5 and symmetric in the upper and lower extremities. No pronation or drift.  Muscle Tone: Tone and muscle bulk are normal in the upper and lower extremities.  Reflexes: No Clonus Coordination: Intact finger-to-nose. No tremor.  Sensation: Intact to light touch. Gait: deferred      ED Results and Treatments Labs (all labs ordered are listed, but only abnormal results are displayed) Labs Reviewed  COMPREHENSIVE METABOLIC PANEL - Abnormal; Notable for the following components:      Result Value   Calcium 10.4 (*)    AST 42 (*)    All other components within normal limits  CBC - Abnormal; Notable for the following components:   WBC 3.2 (*)    Platelets 121 (*)    All other components within normal limits  URINALYSIS, ROUTINE W REFLEX MICROSCOPIC   LIPASE, BLOOD  MAGNESIUM  PHOSPHORUS  PARATHYROID HORMONE, INTACT (NO CA)  CALCIUM, IONIZED  ETHANOL  AMMONIA  CBG MONITORING, ED  EKG  EKG Interpretation  Date/Time:  Saturday Sep 17 2022 02:37:06 EDT Ventricular Rate:  79 PR Interval:  151 QRS Duration: 77 QT Interval:  359 QTC Calculation: 412 R Axis:   58 Text Interpretation: Sinus rhythm Consider left atrial enlargement Confirmed by Drema Pry [phone]) on 09/17/2022 3:07:35 AM       Radiology CT Head Wo Contrast  Result Date: 09/16/2022 CLINICAL DATA:  Altered mental status EXAM: CT HEAD WITHOUT CONTRAST TECHNIQUE: Contiguous axial images were obtained from the base of the skull through the vertex without intravenous contrast. RADIATION DOSE REDUCTION: This exam was performed according to the departmental dose-optimization program which includes automated exposure control, adjustment of the mA and/or kV according to patient size and/or use of iterative reconstruction technique. COMPARISON:  None Available. FINDINGS: Brain: Normal anatomic configuration. Parenchymal volume loss is commensurate with the patient's age. Mild periventricular white matter changes are present likely reflecting the sequela of small vessel ischemia. No abnormal intra or extra-axial mass lesion or fluid collection. No abnormal mass effect or midline shift. No evidence of acute intracranial hemorrhage or infarct. Ventricular size is normal. Cerebellum unremarkable. Vascular: No asymmetric hyperdense vasculature at the skull base. Skull: Intact Sinuses/Orbits: Paranasal sinuses are clear. Orbits are unremarkable. Other: Mastoid air cells and middle ear cavities are clear. IMPRESSION: 1. No acute intracranial hemorrhage or infarct. 2. Mild senescent change. Electronically Signed   By: Helyn Numbers M.D.   On: 09/16/2022 22:57   DG Chest  Port 1 View  Result Date: 09/16/2022 CLINICAL DATA:  Altered mental status.  [id number]. EXAM: PORTABLE CHEST 1 VIEW COMPARISON:  Chest CT 05/26/2022, 07/01/2020 FINDINGS: Heart size and vasculature are normal apart from calcification of the transverse aorta. The mediastinum is normally outlined. No vascular congestion is seen. The sulci are sharp. The lungs are mildly emphysematous with chronic subpleural reticulation. Chronic and stable 1.5 cm lobular nodule right lower lung field, in the middle lobe on CT. No new radiographic abnormality is seen. No active infiltrates. Right axillary surgical clips. There is osteopenia with thoracic spondylosis. Surgical clips right axilla. Left breast implant. Numerous overlying monitor wires right-greater-than-left. IMPRESSION: No acute radiographic chest findings. Stable right middle lobe nodule. COPD and chronic interstitial change. Aortic atherosclerosis. Osteopenia and degenerative change. Electronically Signed   By: Almira Bar M.D.   On: 09/16/2022 22:44    Medications Ordered in ED Medications  sodium chloride 0.9 % bolus 1,000 mL (0 mLs Intravenous Stopped 09/17/22 0133)                                                                                                                                     Procedures Procedures  (including critical care time)  Medical Decision Making / ED Course  Click here for ABCD2, HEART and other calculators  Medical Decision Making Amount and/or Complexity of Data Reviewed Labs: ordered.  Risk Decision regarding hospitalization.  This patient presents to the ED for: AMS   Key initial findings: Disoriented and slow to respond No focal deficits    Additional history obtained: Daughter  Presentation involves an extensive number of treatment options, and is a complaint that carries with it a high risk of complications and morbidity. The differential diagnosis includes but not limited to:  Will assess  for any electrolyte/metabolic derangements, intracranial mass, CVA,  renal insufficiency, infection.  Possible medication side effect  Hospitalization considered:  Yes  Initial intervention:  IV fluids   Work up Interpretation and Management:  Cardiac Monitoring/EKG: Telemetry with normal sinus rhythm with rates in the 80s to 90s EKG without acute ischemic changes, dysrhythmias, blocks.  Interval within normal limits.  Laboratory Tests ordered listed below with my independent interpretation:     Imaging Studies ordered listed below with my independent interpretation:   ED Course:   Clinical Course as of 09/17/22 0627  Fri Sep 16, 2022  2352 CBC without leukocytosis or anemia  Metabolic panel with mild hypercalcemia just above upper limits of normal. No other significant electrolyte derangements.  No renal insufficiency.  Chest x-ray without evidence of pneumonia, pneumothorax, pulmonary edema or pleural effusions  CT head negative for ICH, mass effect or remote stroke. [PC]  2353 Giving IVF  UA pending  [PC]  Sat Sep 17, 2022  0120 UA without evidence of infection.  Patient's mental status still unchanged, will attempt to ambulate. [PC]  0230 Patient was unable to ambulate and has become more lethargic.  I do not feel that patient would be safe to be discharged home and requires more of a workup.  Additional labs ordered. [PC]  [id number] Will discuss case with the hospitalist service for admission [PC]  0601 I spoke with Dr. Arlean Hopping, from hospitalist service who agreed to admit patient [PC]    Clinical Course User Index [PC] Lavance Beazer, Amadeo Garnet, MD     Final Clinical Impression(s) / ED Diagnoses Final diagnoses:  Encephalopathy  Generalized weakness           This chart was dictated using voice recognition software.  Despite best efforts to proofread,  errors can occur which can change the documentation meaning.    Nira Conn, MD 09/17/22  [phone]

## 2022-09-18 DIAGNOSIS — G9340 Encephalopathy, unspecified: Secondary | ICD-10-CM

## 2022-09-18 LAB — CBC
HCT: 40.2 % (ref 36.0–46.0)
Hemoglobin: 13.7 g/dL (ref 12.0–15.0)
MCH: 29.6 pg (ref 26.0–34.0)
MCHC: 34.1 g/dL (ref 30.0–36.0)
MCV: 86.8 fL (ref 80.0–100.0)
Platelets: 109 10*3/uL — ABNORMAL LOW (ref 150–400)
RBC: 4.63 MIL/uL (ref 3.87–5.11)
RDW: 12.8 % (ref 11.5–15.5)
WBC: 2.6 10*3/uL — ABNORMAL LOW (ref 4.0–10.5)
nRBC: 0 % (ref 0.0–0.2)

## 2022-09-18 LAB — AMMONIA: Ammonia: 43 umol/L — ABNORMAL HIGH (ref 9–35)

## 2022-09-18 LAB — COMPREHENSIVE METABOLIC PANEL
ALT: 31 U/L (ref 0–44)
AST: 39 U/L (ref 15–41)
Albumin: 3.6 g/dL (ref 3.5–5.0)
Alkaline Phosphatase: 55 U/L (ref 38–126)
Anion gap: 8 (ref 5–15)
BUN: 14 mg/dL (ref 8–23)
CO2: 23 mmol/L (ref 22–32)
Calcium: 8.8 mg/dL — ABNORMAL LOW (ref 8.9–10.3)
Chloride: 104 mmol/L (ref 98–111)
Creatinine, Ser: 0.82 mg/dL (ref 0.44–1.00)
GFR, Estimated: >60 mL/min (ref >60)
Glucose, Bld: 108 mg/dL — ABNORMAL HIGH (ref 70–99)
Potassium: 3.9 mmol/L (ref 3.5–5.1)
Sodium: 135 mmol/L (ref 135–145)
Total Bilirubin: 0.7 mg/dL (ref 0.3–1.2)
Total Protein: 6.4 g/dL — ABNORMAL LOW (ref 6.5–8.1)

## 2022-09-18 LAB — CALCIUM, IONIZED: Calcium, Ionized, Serum: 5.2 mg/dL (ref 4.5–5.6)

## 2022-09-18 LAB — GLUCOSE, CAPILLARY
Glucose-Capillary: 109 mg/dL — ABNORMAL HIGH (ref 70–99)
Glucose-Capillary: 110 mg/dL — ABNORMAL HIGH (ref 70–99)
Glucose-Capillary: 134 mg/dL — ABNORMAL HIGH (ref 70–99)
Glucose-Capillary: 167 mg/dL — ABNORMAL HIGH (ref 70–99)

## 2022-09-18 LAB — HEMOGLOBIN A1C
Hgb A1c MFr Bld: 6.1 % — ABNORMAL HIGH (ref 4.8–5.6)
Mean Plasma Glucose: 128.37 mg/dL

## 2022-09-18 MED ORDER — LACTATED RINGERS IV SOLN: INTRAVENOUS | Status: DC

## 2022-09-18 MED ORDER — THIAMINE HCL 100 MG/ML IJ SOLN
500.0000 mg | INTRAVENOUS | Status: AC
  Administered 2022-09-18 – 2022-09-20 (×3): 500 mg via INTRAVENOUS
  Filled 2022-09-18 (×3): qty 5

## 2022-09-18 MED ORDER — THIAMINE HCL 100 MG/ML IJ SOLN: 500.0000 mg | INTRAVENOUS | Status: DC

## 2022-09-18 MED ORDER — LACTULOSE 10 GM/15ML PO SOLN
20.0000 g | Freq: Two times a day (BID) | ORAL | Status: DC
  Administered 2022-09-18 – 2022-09-20 (×4): 20 g via ORAL
  Filled 2022-09-18 (×4): qty 30

## 2022-09-18 MED ORDER — MELATONIN 3 MG PO TABS
3.0000 mg | ORAL_TABLET | Freq: Once | ORAL | Status: AC
  Administered 2022-09-18: 3 mg via ORAL
  Filled 2022-09-18: qty 1

## 2022-09-18 MED ORDER — CYANOCOBALAMIN 1000 MCG/ML IJ SOLN
1000.0000 ug | Freq: Every day | INTRAMUSCULAR | Status: DC
  Administered 2022-09-18 – 2022-09-20 (×3): 1000 ug via INTRAMUSCULAR
  Filled 2022-09-18 (×3): qty 1

## 2022-09-18 NOTE — Evaluation (Signed)
Occupational Therapy Evaluation Patient Details Name: Brenda Pham MRN: [id number] DOB: Jul 01, 1940 Today's Date: 09/18/2022   History of Present Illness Brenda Pham is a 82 y.o. female with medical history significant of osteoarthritis, breast cancer, type 2 diabetes, hyperlipidemia,, internal hemorrhoids, colon polyps, left foot diabetic ulcer, abscess to left foot who was brought to the emergency department due to intermittent disorientation for the past week.   Clinical Impression   Brenda Pham is an 82 year old woman who typically is independent, lives alone, and with cognitive deficits. Her daughter reports she is typically alert and oriented. On evaluation patient presents with generalized weakness, decreased activity tolerance, impaired balance with a heavy posterior lean, and altered mental status. Cognitively patient is alert to self and knows she hasn't been "acting right" and is having memory issues. She exhibits slow processing, poor safety awareness, poor insight into deficits and needs verbal cues for problem solving simple ADL tasks. Patient needing mod assist for transfers, standing and ambulation with walker due to heavy posterior lean. She falls back into sitting without bending kness or hips. She needs increased assistance for ADLs. Patient will benefit from skilled OT services while in hospital to improve deficits and learn compensatory strategies as needed in order to return to PLOF.         Recommendations for follow up therapy are one component of a multi-disciplinary discharge planning process, led by the attending physician.  Recommendations may be updated based on patient status, additional functional criteria and insurance authorization.   Assistance Recommended at Discharge Frequent or constant Supervision/Assistance  Patient can return home with the following A lot of help with walking and/or transfers;A lot of help with  bathing/dressing/bathroom;Assistance with cooking/housework;Direct supervision/assist for medications management;Assist for transportation;Help with stairs or ramp for entrance;Direct supervision/assist for financial management    Functional Status Assessment  Patient has had a recent decline in their functional status and demonstrates the ability to make significant improvements in function in a reasonable and predictable amount of time.  Equipment Recommendations  Other (comment) (defer to next venue)    Recommendations for Other Services       Precautions / Restrictions Precautions Precautions: Fall Precaution Comments: posterior lean Restrictions Weight Bearing Restrictions: No      Mobility Bed Mobility Overal bed mobility: Needs Assistance Bed Mobility: Supine to Sit     Supine to sit: Mod assist, HOB elevated     General bed mobility comments: mod assist and increased time to transfer to edge of bed.    Transfers Overall transfer level: Needs assistance Equipment used: Rolling walker (2 wheels) Transfers: Sit to/from Stand, Bed to chair/wheelchair/BSC Sit to Stand: Mod assist, From elevated surface, +2 safety/equipment     Step pivot transfers: Mod assist, +2 physical assistance, +2 safety/equipment     General transfer comment: Patient mod assist to power up and position anteriorly to not fall backwards as she is exhibiting a posterior lean. Once positioned anteriorlly and leaning over walker Brenda Pham ambulated to bathroom with physical assistance to keep patient from falling posteriorly. Patient max assist for emergent transfer to toilet due to urinary incontinence and urgency. With sitting - on toilet and into recliner -- patient falls back, doesn't bend knees or hips.      Balance Overall balance assessment: Needs assistance Sitting-balance support: No upper extremity supported, Feet supported Sitting balance-Leahy Scale: Fair     Standing balance support:  During functional activity, Reliant on assistive device for balance Standing balance-Leahy Scale:  Poor Standing balance comment: requires mod physical assist from therapist to maintain balance                           ADL either performed or assessed with clinical judgement   ADL Overall ADL's : Needs assistance/impaired Eating/Feeding: Set up;Sitting   Grooming: Wash/dry hands;Standing;Cueing for sequencing Grooming Details (indicate cue type and reason): Had patient stand at sink - lean elbows on counter to offset postier balance. Patient needed cueing to sequence hand washing task. Patient's processing very slow. Upper Body Bathing: Set up;Sitting;Cueing for sequencing   Lower Body Bathing: Moderate assistance;Cueing for sequencing;Sitting/lateral leans   Upper Body Dressing : Minimal assistance;Cueing for sequencing   Lower Body Dressing: Maximal assistance;Sit to/from stand;+2 for physical assistance;Cueing for sequencing   Toilet Transfer: Maximal assistance;+2 for physical assistance;Rolling walker (2 wheels);Grab bars;Cueing for sequencing   Toileting- Clothing Manipulation and Hygiene: Maximal assistance;Cueing for sequencing;Cueing for safety;Sit to/from stand       Functional mobility during ADLs: Moderate assistance;+2 for safety/equipment;+2 for physical assistance;Rolling walker (2 wheels)       Vision Patient Visual Report: No change from baseline       Perception     Praxis      Pertinent Vitals/Pain Pain Assessment Pain Assessment: No/denies pain     Hand Dominance Right   Extremity/Trunk Assessment Upper Extremity Assessment Upper Extremity Assessment: Overall WFL for tasks assessed   Lower Extremity Assessment Lower Extremity Assessment: Defer to PT evaluation   Cervical / Trunk Assessment Cervical / Trunk Assessment: Kyphotic   Communication Communication Communication: No difficulties   Cognition Arousal/Alertness:  Awake/alert   Overall Cognitive Status: Impaired/Different from baseline Area of Impairment: Orientation, Safety/judgement, Awareness, Problem solving                 Orientation Level: Person, Place       Safety/Judgement: Decreased awareness of safety, Decreased awareness of deficits Awareness: Emergent Problem Solving: Slow processing, Difficulty sequencing, Requires verbal cues       General Comments       Exercises     Shoulder Instructions      Home Living Family/patient expects to be discharged to:: Private residence Living Arrangements: Alone Available Help at Discharge: Family;Available PRN/intermittently Type of Home: House Home Access: Stairs to enter;Ramped entrance Entrance Stairs-Number of Steps: 3   Home Layout: One level     Bathroom Shower/Tub: Producer, television/film/video: Standard Bathroom Accessibility: Yes How Accessible: Accessible via walker Home Equipment: Standard Walker;Cane - single point;Shower seat          Prior Functioning/Environment Prior Level of Function : Independent/Modified Independent             Mobility Comments: uses a cane intermittently. ADLs Comments: grossly independent. Typically sponge bathes and takes a shower once a week        OT Problem List: Decreased strength;Decreased activity tolerance;Impaired balance (sitting and/or standing);Decreased cognition;Decreased safety awareness;Decreased knowledge of use of DME or AE      OT Treatment/Interventions: Self-care/ADL training;Therapeutic exercise;DME and/or AE instruction;Cognitive remediation/compensation;Therapeutic activities;Balance training;Patient/family education    OT Goals(Current goals can be found in the care plan section) Acute Rehab OT Goals Patient Stated Goal: to get better OT Goal Formulation: With patient/family Time For Goal Achievement: 10/02/22 Potential to Achieve Goals: Good  OT Frequency: Min 2X/week    Co-evaluation  AM-PAC OT "6 Clicks" Daily Activity     Outcome Measure Help from another person eating meals?: A Little Help from another person taking care of personal grooming?: A Little Help from another person toileting, which includes using toliet, bedpan, or urinal?: A Lot Help from another person bathing (including washing, rinsing, drying)?: A Lot Help from another person to put on and taking off regular upper body clothing?: A Little Help from another person to put on and taking off regular lower body clothing?: A Lot 6 Click Score: 15   End of Session Equipment Utilized During Treatment: Rolling walker (2 wheels);Gait belt Nurse Communication: Mobility status  Activity Tolerance: Patient tolerated treatment well Patient left: in chair;with call bell/phone within reach;with chair alarm set;with family/visitor present  OT Visit Diagnosis: Unsteadiness on feet (R26.81);Muscle weakness (generalized) (M62.81);Other symptoms and signs involving cognitive function                Time: 1131-1205 OT Time Calculation (min): 34 min Charges:  OT General Charges $OT Visit: 1 Visit OT Evaluation $OT Eval Low Complexity: 1 Low OT Treatments $Self Care/Home Management : 8-22 mins  Donnella Pham, OTR/L Acute Care Rehab Services  Office [phone]   Kelli Churn 09/18/2022, 12:58 PM

## 2022-09-18 NOTE — Evaluation (Signed)
Physical Therapy Evaluation Patient Details Name: Brenda Pham MRN: [id number] DOB: [birth year] Today's Date: 09/18/2022  History of Present Illness  Brenda Pham is a 82 y.o. female with medical history significant of osteoarthritis, breast cancer, type 2 diabetes, hyperlipidemia,, internal hemorrhoids, colon polyps, left foot diabetic ulcer, abscess to left foot, diabetes who was brought to the emergency department due to intermittent disorientation for the past week and admitted 09/16/22 for Acute metabolic encephalopathy  Clinical Impression  Pt admitted with above diagnosis.  Pt currently with functional limitations due to the deficits listed below (see PT Problem List). Pt will benefit from acute skilled PT to increase their independence and safety with mobility to allow discharge.  Pt presenting with cognitive deficits including memory, awareness of deficits, and processing.  Pt also requiring at least mod assist to stand and step by step cues for technique.  Pt presents with weakness and balance deficits as well.  Pt's daughter present and reports pt was independent and living alone prior to admission however had noticed some mobility and cognitive deficits starting about a week ago.  Pt's daughter does not feel pt would be safe to return home, and pt requiring increased assist at this time so would benefit from post acute rehab upon d/c.     Recommendations for follow up therapy are one component of a multi-disciplinary discharge planning process, led by the attending physician.  Recommendations may be updated based on patient status, additional functional criteria and insurance authorization.  Follow Up Recommendations Can patient physically be transported by private vehicle: No     Assistance Recommended at Discharge Frequent or constant Supervision/Assistance  Patient can return home with the following  A lot of help with walking and/or transfers;A lot of help with  bathing/dressing/bathroom;Help with stairs or ramp for entrance;Assistance with cooking/housework;Direct supervision/assist for medications management;Direct supervision/assist for financial management;Assist for transportation    Equipment Recommendations Rolling walker (2 wheels)  Recommendations for Other Services       Functional Status Assessment Patient has had a recent decline in their functional status and demonstrates the ability to make significant improvements in function in a reasonable and predictable amount of time.     Precautions / Restrictions Precautions Precautions: Fall Precaution Comments: posterior lean Restrictions Weight Bearing Restrictions: No      Mobility  Bed Mobility               General bed mobility comments: OOB with OT earlier, pt in recliner on arrival to room    Transfers Overall transfer level: Needs assistance Equipment used: Rolling walker (2 wheels) Transfers: Sit to/from Stand Sit to Stand: Mod assist           General transfer comment: step by step cues for technique, pt with limited right knee flexion (hx TKA) and appears to have decreased hip flexion and hip strength as well; pt typically "rocks" a few times to initiate standing at baseline;  required assist to rise and stabilize due to posterior lean, also having difficulty understanding / performing (?) pushing down on RW through UEs to better self stabilize; cues for marching in place which pt was briefly able to perform however requiring assist to stabilize and also reports fear of falling; performed sit to stand and marching as able x2    Ambulation/Gait               General Gait Details: not able to safely perform, would need +2  Stairs  Wheelchair Mobility    Modified Rankin (Stroke Patients Only)       Balance Overall balance assessment: Needs assistance Sitting-balance support: Feet supported, No upper extremity supported Sitting  balance-Leahy Scale: Fair     Standing balance support: During functional activity, Reliant on assistive device for balance, Bilateral upper extremity supported Standing balance-Leahy Scale: Zero                               Pertinent Vitals/Pain Pain Assessment Pain Assessment: No/denies pain    Home Living Family/patient expects to be discharged to:: Private residence Living Arrangements: Alone Available Help at Discharge: Family;Available PRN/intermittently Type of Home: House Home Access: Stairs to enter;Ramped entrance   Entrance Stairs-Number of Steps: 3   Home Layout: One level Home Equipment: Standard Walker;Cane - single point;Shower seat      Prior Function Prior Level of Function : Independent/Modified Independent             Mobility Comments: uses a cane intermittently. ADLs Comments: grossly independent. Typically sponge bathes and takes a shower once a week     Hand Dominance   Dominant Hand: Right    Extremity/Trunk Assessment   Upper Extremity Assessment Upper Extremity Assessment: Overall WFL for tasks assessed    Lower Extremity Assessment Lower Extremity Assessment: Generalized weakness;RLE deficits/detail RLE Deficits / Details: hx right TKA, only able to perform approx 90* knee flexion in sitting; poor ability to perform trunk/ bil hip flexion functionally with cues    Cervical / Trunk Assessment Cervical / Trunk Assessment: Kyphotic  Communication   Communication: No difficulties  Cognition Arousal/Alertness: Awake/alert Behavior During Therapy: Flat affect Overall Cognitive Status: Impaired/Different from baseline Area of Impairment: Orientation, Safety/judgement, Awareness, Problem solving                 Orientation Level: Place, Person       Safety/Judgement: Decreased awareness of safety, Decreased awareness of deficits   Problem Solving: Slow processing, Difficulty sequencing, Requires verbal  cues General Comments: looks over to her daughter to answer questions when she doesn't know the answer, utilizes white board in room to answer questions as well        General Comments      Exercises     Assessment/Plan    PT Assessment Patient needs continued PT services  PT Problem List Decreased strength;Decreased activity tolerance;Decreased balance;Decreased mobility;Decreased cognition;Decreased safety awareness;Decreased knowledge of use of DME       PT Treatment Interventions Gait training;DME instruction;Therapeutic exercise;Functional mobility training;Therapeutic activities;Patient/family education;Balance training;Neuromuscular re-education    PT Goals (Current goals can be found in the Care Plan section)  Acute Rehab PT Goals PT Goal Formulation: With patient/family Time For Goal Achievement: 10/02/22 Potential to Achieve Goals: Good    Frequency Min 1X/week     Co-evaluation               AM-PAC PT "6 Clicks" Mobility  Outcome Measure Help needed turning from your back to your side while in a flat bed without using bedrails?: A Lot Help needed moving from lying on your back to sitting on the side of a flat bed without using bedrails?: A Lot Help needed moving to and from a bed to a chair (including a wheelchair)?: A Lot Help needed standing up from a chair using your arms (e.g., wheelchair or bedside chair)?: A Lot Help needed to walk in hospital room?: A Lot Help needed  climbing 3-5 steps with a railing? : Total 6 Click Score: 11    End of Session Equipment Utilized During Treatment: Gait belt Activity Tolerance: Patient limited by fatigue;Other (comment) (fearful of falling)   Nurse Communication: Mobility status PT Visit Diagnosis: Difficulty in walking, not elsewhere classified (R26.2);Unsteadiness on feet (R26.81)    Time: 1610-9604 PT Time Calculation (min) (ACUTE ONLY): 20 min   Charges:   PT Evaluation $PT Eval Low Complexity: 1 Low         Kati PT, DPT Physical Therapist Acute Rehabilitation Services Office: [phone]   Kati L Payson 09/18/2022, 4:30 PM

## 2022-09-18 NOTE — Progress Notes (Addendum)
PROGRESS NOTE    Brenda Pham  ZOX:[id number] DOB: 1940-09-27 DOA: 09/16/2022 PCP: Kaleen Mask, MD   Brief Narrative: 82 year old with past medical history significant for osteoarthritis breast cancer, diabetes type 2, hyperlipidemia, internal hemorrhoids, colon polyps, left foot diabetic ulcer, history of left foot abscess presents to the ED with with intermittent disorientation for the past week.  She lives independently and is usually oriented x 4 according to daughter.  Patient has been able to answers simple questions but has not being able to elaborate. Per ED notes, at baseline patient is able to care for herself, she is very social and very friendly.  Daughter is speak with her 2 or 3 times per week.  Daughter reports that over the last 1 or 2 weeks she has noted the patient's behavior has been changing.  Patient has been missing her weekly hair salon appointments twice with is extremely unusual.  Patient brought her home with her over Mother's Day to spend the weekend in [place].  Patient has been generally weak and had difficulty ambulating requiring assistance with a walker.  Patient had some mild improvement while she was at the patient's daughter.  When daughter spoke with her later in the week patient was noted to be more confused and had some difficulty finding words.   Assessment & Plan:   Principal Problem:   Acute encephalopathy Active Problems:   Hyperlipidemia   Peripheral sensory neuropathy due to type 2 diabetes mellitus (HCC)   Type 2 diabetes mellitus (HCC)   Neutropenia (HCC)   Thrombocytopenia (HCC)  1-Acute Metabolic Encephalopathy; -Could be multifactorial: B 12 low at 200, start supplement. Ammonia level mildly elevated 43. Start low dose lactulose.  -Started High dose IV Thiamine.  -MRI negative for stroke, chronic micro hemorrhages, which increases risk for dementia. She might have some underline diagnosis of dementia.  -will proceed with  EEG.  -check RPR>   2-Hyperlipidemia; not currently on therapy.   Diabetes Type 2;  SSI  Neutropenia, thrombocytopenia;  Replete B12.       Estimated body mass index is 25.86 kg/m as calculated from the following:   Height as of 07/20/22: 5\' 3"  (1.6 m).   Weight as of 07/20/22: 66.2 kg.   DVT prophylaxis: SCD Code Status: Full code Family Communication: will contact Daughter  Disposition Plan:  Status is: Inpatient Remains inpatient appropriate because: management of encephalopathy     Consultants:  none  Procedures:    Antimicrobials:    Subjective: She is alert, she slowly answer questions. She knew her name, and she was [place] system.  She denies cough, dysuria, diarrhea.  Daughter report stare episode last week, while she was trying to feed her breakfast.  Also patient reporter some hallucination, if someone was lying in bed with her. She decreased tylenol # 3 dose.   Objective: Vitals:   09/17/22 1210 09/17/22 1604 09/17/22 2000 09/18/22 0509  BP: (!) 103/59 96/61 119/62 121/67  Pulse: 85 81 (!) 104 93  Resp: 19 20 18 18   Temp: 98.3 F (36.8 C) 98.1 F (36.7 C) 97.6 F (36.4 C) 97.8 F (36.6 C)  TempSrc: Oral Oral Oral Oral  SpO2: 98% 96% 96% 98%    Intake/Output Summary (Last 24 hours) at 09/18/2022 1601 Last data filed at 09/18/2022 1517 Gross per 24 hour  Intake 290 ml  Output --  Net 290 ml   There were no vitals filed for this visit.  Examination:  General exam: Appears calm  and comfortable  Respiratory system: Clear to auscultation. Respiratory effort normal. Cardiovascular system: S1 & S2 heard, RRR. No JVD, murmurs, rubs, gallops or clicks. No pedal edema. Gastrointestinal system: Abdomen is nondistended, soft and nontender. No organomegaly or masses felt. Normal bowel sounds heard. Central nervous system: Alert and oriented.  Extremities: Symmetric 5 x 5 power.   Data Reviewed: I have personally reviewed following labs and  imaging studies  CBC: Recent Labs  Lab 09/16/22 2149 09/17/22 1105 09/18/22 0350  WBC 3.2* 2.1* 2.6*  HGB 14.4 14.1 13.7  HCT 42.0 41.9 40.2  MCV 86.1 87.5 86.8  PLT 121* 109* 109*   Basic Metabolic Panel: Recent Labs  Lab 09/16/22 2149 09/17/22 0229 09/17/22 1105 09/18/22 0350  NA 135  --  134* 135  K 4.1  --  3.9 3.9  CL 99  --  103 104  CO2 26  --  23 23  GLUCOSE 96  --  113* 108*  BUN 19  --  13 14  CREATININE 0.84  --  0.69 0.82  CALCIUM 10.4*  --  9.0 8.8*  MG  --  1.9  --   --   PHOS  --  3.6  --   --    GFR: CrCl cannot be calculated (Unknown ideal weight.). Liver Function Tests: Recent Labs  Lab 09/16/22 2149 09/17/22 1105 09/18/22 0350  AST 42* 39 39  ALT 31 33 31  ALKPHOS 57 51 55  BILITOT 0.5 0.7 0.7  PROT 7.3 6.2* 6.4*  ALBUMIN 4.6 3.6 3.6   Recent Labs  Lab 09/17/22 0229  LIPASE 42   Recent Labs  Lab 09/17/22 0547 09/18/22 1300  AMMONIA 23 43*   Coagulation Profile: No results for input(s): "INR", "PROTIME" in the last 168 hours. Cardiac Enzymes: No results for input(s): "CKTOTAL", "CKMB", "CKMBINDEX", "TROPONINI" in the last 168 hours. BNP (last 3 results) No results for input(s): "PROBNP" in the last 8760 hours. HbA1C: Recent Labs    09/18/22 0350  HGBA1C 6.1*   CBG: Recent Labs  Lab 09/16/22 2154 09/17/22 1019 09/17/22 2354 09/18/22 0750 09/18/22 1209  GLUCAP 78 114* 115* 109* 110*   Lipid Profile: No results for input(s): "CHOL", "HDL", "LDLCALC", "TRIG", "CHOLHDL", "LDLDIRECT" in the last 72 hours. Thyroid Function Tests: Recent Labs    09/17/22 1105  TSH 1.951   Anemia Panel: Recent Labs    09/17/22 1105  VITAMINB12 206  FOLATE 15.2   Sepsis Labs: No results for input(s): "PROCALCITON", "LATICACIDVEN" in the last 168 hours.  No results found for this or any previous visit (from the past 240 hour(s)).       Radiology Studies: MR BRAIN WO CONTRAST  Result Date: 09/17/2022 CLINICAL DATA:   Mental status change, unknown cause EXAM: MRI HEAD WITHOUT CONTRAST TECHNIQUE: Multiplanar, multiecho pulse sequences of the brain and surrounding structures were obtained without intravenous contrast. COMPARISON:  CT head May 17, 24. FINDINGS: Brain: No acute infarction, acute hemorrhage, hydrocephalus, extra-axial collection or mass lesion. Mild for age chronic microvascular ischemic change. Scattered punctate foci of susceptibility artifact, compatible with chronic microhemorrhages that are predominantly in the cortex. Vascular: Major arterial flow voids are maintained at the skull base. Skull and upper cervical spine: Normal marrow signal. Sinuses/Orbits: Clear sinuses.  No acute orbital findings. IMPRESSION: 1. No evidence of acute intracranial abnormality. 2. Scattered predominantly cortical chronic microhemorrhages, which is nonspecific but can be seen with amyloid angiopathy. Electronically Signed   By: Juluis Mire.D.  On: 09/17/2022 14:36   CT Head Wo Contrast  Result Date: 09/16/2022 CLINICAL DATA:  Altered mental status EXAM: CT HEAD WITHOUT CONTRAST TECHNIQUE: Contiguous axial images were obtained from the base of the skull through the vertex without intravenous contrast. RADIATION DOSE REDUCTION: This exam was performed according to the departmental dose-optimization program which includes automated exposure control, adjustment of the mA and/or kV according to patient size and/or use of iterative reconstruction technique. COMPARISON:  None Available. FINDINGS: Brain: Normal anatomic configuration. Parenchymal volume loss is commensurate with the patient's age. Mild periventricular white matter changes are present likely reflecting the sequela of small vessel ischemia. No abnormal intra or extra-axial mass lesion or fluid collection. No abnormal mass effect or midline shift. No evidence of acute intracranial hemorrhage or infarct. Ventricular size is normal. Cerebellum unremarkable.  Vascular: No asymmetric hyperdense vasculature at the skull base. Skull: Intact Sinuses/Orbits: Paranasal sinuses are clear. Orbits are unremarkable. Other: Mastoid air cells and middle ear cavities are clear. IMPRESSION: 1. No acute intracranial hemorrhage or infarct. 2. Mild senescent change. Electronically Signed   By: Helyn Numbers M.D.   On: 09/16/2022 22:57   DG Chest Port 1 View  Result Date: 09/16/2022 CLINICAL DATA:  Altered mental status.  [id number]. EXAM: PORTABLE CHEST 1 VIEW COMPARISON:  Chest CT 05/26/2022, 07/01/2020 FINDINGS: Heart size and vasculature are normal apart from calcification of the transverse aorta. The mediastinum is normally outlined. No vascular congestion is seen. The sulci are sharp. The lungs are mildly emphysematous with chronic subpleural reticulation. Chronic and stable 1.5 cm lobular nodule right lower lung field, in the middle lobe on CT. No new radiographic abnormality is seen. No active infiltrates. Right axillary surgical clips. There is osteopenia with thoracic spondylosis. Surgical clips right axilla. Left breast implant. Numerous overlying monitor wires right-greater-than-left. IMPRESSION: No acute radiographic chest findings. Stable right middle lobe nodule. COPD and chronic interstitial change. Aortic atherosclerosis. Osteopenia and degenerative change. Electronically Signed   By: Almira Bar M.D.   On: 09/16/2022 22:44        Scheduled Meds:  cyanocobalamin  1,000 mcg Intramuscular Daily   folic acid-pyridoxine-cyancobalamin  1 tablet Oral Daily   lactulose  20 g Oral BID   Continuous Infusions:  thiamine (VITAMIN B1) injection Stopped (09/18/22 1243)     LOS: 1 day    Time spent: 35 minutes.     Alba Cory, MD [facility]   If 7PM-7AM, please contact night-coverage [email or web address]  09/18/2022, 4:01 PM

## 2022-09-19 ENCOUNTER — Inpatient Hospital Stay (HOSPITAL_COMMUNITY)
Admit: 2022-09-19 | Discharge: 2022-09-19 | Disposition: A | Discharge status: Home / Self Care | Payer: Medicare Other | Attending: Internal Medicine | Admitting: Internal Medicine

## 2022-09-19 DIAGNOSIS — R4182 Altered mental status, unspecified: Secondary | ICD-10-CM

## 2022-09-19 DIAGNOSIS — R569 Unspecified convulsions: Secondary | ICD-10-CM

## 2022-09-19 DIAGNOSIS — G9340 Encephalopathy, unspecified: Secondary | ICD-10-CM | POA: Diagnosis not present

## 2022-09-19 LAB — CBC
HCT: 44.4 % (ref 36.0–46.0)
Hemoglobin: 14.6 g/dL (ref 12.0–15.0)
MCH: 29.3 pg (ref 26.0–34.0)
MCHC: 32.9 g/dL (ref 30.0–36.0)
MCV: 89 fL (ref 80.0–100.0)
Platelets: 97 10*3/uL — ABNORMAL LOW (ref 150–400)
RBC: 4.99 MIL/uL (ref 3.87–5.11)
RDW: 12.9 % (ref 11.5–15.5)
WBC: 2.4 10*3/uL — ABNORMAL LOW (ref 4.0–10.5)
nRBC: 0 % (ref 0.0–0.2)

## 2022-09-19 LAB — BASIC METABOLIC PANEL
Anion gap: 11 (ref 5–15)
BUN: 11 mg/dL (ref 8–23)
CO2: 20 mmol/L — ABNORMAL LOW (ref 22–32)
Calcium: 9 mg/dL (ref 8.9–10.3)
Chloride: 105 mmol/L (ref 98–111)
Creatinine, Ser: 0.78 mg/dL (ref 0.44–1.00)
GFR, Estimated: >60 mL/min (ref >60)
Glucose, Bld: 163 mg/dL — ABNORMAL HIGH (ref 70–99)
Potassium: 3.7 mmol/L (ref 3.5–5.1)
Sodium: 136 mmol/L (ref 135–145)

## 2022-09-19 LAB — AMMONIA: Ammonia: 34 umol/L (ref 9–35)

## 2022-09-19 LAB — RPR: RPR Ser Ql: NONREACTIVE

## 2022-09-19 LAB — GLUCOSE, CAPILLARY
Glucose-Capillary: 94 mg/dL (ref 70–99)
Glucose-Capillary: 170 mg/dL — ABNORMAL HIGH (ref 70–99)
Glucose-Capillary: 107 mg/dL — ABNORMAL HIGH (ref 70–99)
Glucose-Capillary: 120 mg/dL — ABNORMAL HIGH (ref 70–99)

## 2022-09-19 NOTE — Progress Notes (Signed)
EEG complete - results pending 

## 2022-09-19 NOTE — TOC Progression Note (Signed)
Transition of Care [facility]) - Progression Note    Patient Details  Name: Brenda Pham MRN: [id number] Date of Birth: 1940-06-16  Transition of Care [facility]) CM/SW Contact  Geni Bers, RN Phone Number: 09/19/2022, 3:38 PM  Clinical Narrative:     Spoke with pt's daughter Alvis Lemmings concerning discharge plans to SNF for rehab. Dawn asked for [employer] or [employer]. Dawn also stated any other one that is good, because she want the best for her mother. Faxed out to SNF/Rhab.  Expected Discharge Plan: Skilled Nursing Facility Barriers to Discharge: No Barriers Identified  Expected Discharge Plan and Services     Post Acute Care Choice: Skilled Nursing Facility Living arrangements for the past 2 months: Independent Living Facility                                       Social Determinants of Health (SDOH) Interventions SDOH Screenings   Tobacco Use: Medium Risk (09/17/2022)    Readmission Risk Interventions     No data to display

## 2022-09-19 NOTE — NC FL2 (Signed)
[place] MEDICAID FL2 LEVEL OF CARE FORM     IDENTIFICATION  Patient Name: Brenda Pham Birthdate: [birth year] Sex: female Admission Date (Current Location): 09/16/2022  [facility] and [place] Number:  Producer, television/film/video and Address:  [facility],  [address] [place], [place] [address]      Provider Number: [id number]  Attending Physician Name and Address:  Alba Cory, MD  Relative Name and Phone Number:  Roth,Dawn Daughter [phone]    Current Level of Care: Hospital Recommended Level of Care: Skilled Nursing Facility Prior Approval Number:    Date Approved/Denied:   PASRR Number: [id number]  Discharge Plan: SNF    Current Diagnoses: Patient Active Problem List   Diagnosis Date Noted   Acute encephalopathy 09/17/2022   Type 2 diabetes mellitus (HCC) 09/17/2022   Neutropenia (HCC) 09/17/2022   Thrombocytopenia (HCC) 09/17/2022   Tailor's bunion of left foot 10/27/2021   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   Abscess of left foot 03/01/2021   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) 12/28/2020   History of adenomatous polyp of colon 08/30/2017   Hyperlipidemia 08/02/2017   Internal hemorrhoids 08/02/2017   Allergic reaction 04/04/2016   Drug allergy 03/28/2016   Overweight (BMI 25.0-29.9) 07/09/2014   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/07/2014    Orientation RESPIRATION BLADDER Height & Weight     Self, Time, Place  Normal Incontinent Weight:   Height:     BEHAVIORAL SYMPTOMS/MOOD NEUROLOGICAL BOWEL NUTRITION STATUS      Continent Diet (Heart Healthy/Carb Modified)  AMBULATORY STATUS COMMUNICATION OF NEEDS Skin   Extensive Assist Verbally Other (Comment) (QI:ONGE 5th Amputation Ray, Rt breast modified mastectomy)                       Personal Care Assistance Level of Assistance  Bathing, Feeding, Dressing Bathing Assistance: Limited assistance Feeding assistance: Limited  assistance Dressing Assistance: Maximum assistance     Functional Limitations Info  Sight, Hearing, Speech Sight Info: Impaired Hearing Info: Adequate Speech Info: Adequate    SPECIAL CARE FACTORS FREQUENCY  PT (By licensed PT), OT (By licensed OT)     PT Frequency: 5x week OT Frequency: 5x week            Contractures Contractures Info: Not present    Additional Factors Info  Code Status, Allergies Code Status Info: FULL Allergies Info: Sulfa Antibiotics, Keflex (Cephalexin), Niacin And Related, Other, Septra (Sulfamethoxazole-trimethoprim)           Current Medications (09/19/2022):  This is the current hospital active medication list Current Facility-Administered Medications  Medication Dose Route Frequency Provider Last Rate Last Admin   acetaminophen (TYLENOL) tablet 650 mg  650 mg Oral Q6H PRN Bobette Mo, MD       Or   acetaminophen (TYLENOL) suppository 650 mg  650 mg Rectal Q6H PRN Bobette Mo, MD       cyanocobalamin (VITAMIN B12) injection 1,000 mcg  1,000 mcg Intramuscular Daily Regalado, Belkys A, MD   1,000 mcg at 09/19/22 0845   folic acid-pyridoxine-cyancobalamin (FOLTX) 2.5-25-2 MG per tablet 1 tablet  1 tablet Oral Daily Bobette Mo, MD   1 tablet at 09/19/22 0844   lactated ringers infusion   Intravenous Continuous Regalado, Belkys A, MD 75 mL/hr at 09/19/22 1400 Infusion Verify at 09/19/22 1400   lactulose (CHRONULAC) 10 GM/15ML solution 20 g  20 g Oral BID Regalado,  Belkys A, MD   20 g at 09/19/22 0845   ondansetron (ZOFRAN) tablet 4 mg  4 mg Oral Q6H PRN Bobette Mo, MD       Or   ondansetron [facility]) injection 4 mg  4 mg Intravenous Q6H PRN Bobette Mo, MD       thiamine (VITAMIN B1) 500 mg in sodium chloride 0.9 % 50 mL IVPB  500 mg Intravenous Q24H Regalado, Belkys A, MD   Stopped at 09/19/22 1215     Discharge Medications: Please see discharge summary for a list of discharge medications.  Relevant  Imaging Results:  Relevant Lab Results:   Additional Information SS#[id number]  Geni Bers, RN

## 2022-09-19 NOTE — Procedures (Signed)
Patient Name: Brenda Pham  MRN: [id number]  Epilepsy Attending: Charlsie Quest  Referring Physician/Provider: Alba Cory, MD  Date: 09/19/2022 Duration: 23.08 mins  Patient history: 81yo f with ams getting ege to evaluate for seizure.  Level of alertness: Awake  AEDs during EEG study: None  Technical aspects: This EEG study was done with scalp electrodes positioned according to the 10-20 International system of electrode placement. Electrical activity was reviewed with band pass filter of 1-70Hz , sensitivity of 7 uV/mm, display speed of 38mm/sec with a 60Hz  notched filter applied as appropriate. EEG data were recorded continuously and digitally stored.  Video monitoring was available and reviewed as appropriate.  Description: The posterior dominant rhythm consists of 8Hz  activity of moderate voltage (25-35 uV) seen predominantly in posterior head regions, symmetric and reactive to eye opening and eye closing. Hyperventilation and photic stimulation were not performed.     IMPRESSION: This study is within normal limits. No seizures or epileptiform discharges were seen throughout the recording.  A normal interictal EEG does not exclude the diagnosis of epilepsy.  [name]

## 2022-09-19 NOTE — Progress Notes (Signed)
Mobility Specialist - Progress Note   09/19/22 1421  Mobility  Activity Transferred from chair to bed  Level of Assistance +2 (takes two people)  Assistive Device None  Distance Ambulated (ft) 4 ft  Range of Motion/Exercises Active  Activity Response Tolerated well  Mobility Referral Yes  $Mobility charge 1 Mobility  Mobility Specialist Start Time (ACUTE ONLY) 1415  Mobility Specialist Stop Time (ACUTE ONLY) 1421  Mobility Specialist Time Calculation (min) (ACUTE ONLY) 6 min   Pt received in chair and agreed to transfer, RN assisted and pt returned to bed with all needs met. With staff and family in room.  [name] Mobility Specialist

## 2022-09-19 NOTE — Progress Notes (Signed)
PROGRESS NOTE    Brenda Pham  XLK:[id number] DOB: 16-Apr-1941 DOA: 09/16/2022 PCP: Kaleen Mask, MD   Brief Narrative: 82 year old with past medical history significant for osteoarthritis breast cancer, diabetes type 2, hyperlipidemia, internal hemorrhoids, colon polyps, left foot diabetic ulcer, history of left foot abscess presents to the ED with with intermittent disorientation for the past week.  She lives independently and is usually oriented x 4 according to daughter.  Patient has been able to answers simple questions but has not being able to elaborate. Per ED notes, at baseline patient is able to care for herself, she is very social and very friendly.  Daughter is speak with her 2 or 3 times per week.  Daughter reports that over the last 1 or 2 weeks she has noted the patient's behavior has been changing.  Patient has been missing her weekly hair salon appointments twice with is extremely unusual.  Patient brought her home with her over Mother's Day to spend the weekend in [place].  Patient has been generally weak and had difficulty ambulating requiring assistance with a walker.  Patient had some mild improvement while she was at the patient's daughter.  When daughter spoke with her later in the week patient was noted to be more confused and had some difficulty finding words.   Assessment & Plan:   Principal Problem:   Acute encephalopathy Active Problems:   Hyperlipidemia   Peripheral sensory neuropathy due to type 2 diabetes mellitus (HCC)   Type 2 diabetes mellitus (HCC)   Neutropenia (HCC)   Thrombocytopenia (HCC)  1-Acute Metabolic Encephalopathy; -Could be multifactorial: B 12 low at 200, started  supplement. Ammonia level mildly elevated 43. Started  low dose lactulose.  -Started High dose IV Thiamine.  -MRI negative for stroke, chronic micro hemorrhages, which increases risk for dementia. She might have some underline diagnosis of dementia.  -EEG.  Pending.  - RPR> negative   2-Hyperlipidemia; not currently on therapy.   Diabetes Type 2;  SSI  Neutropenia, thrombocytopenia;  Replete B12.    Estimated body mass index is 25.86 kg/m as calculated from the following:   Height as of 07/20/22: 5\' 3"  (1.6 m).   Weight as of 07/20/22: 66.2 kg.   DVT prophylaxis: SCD Code Status: Full code Family Communication: Will contact Daughter  Disposition Plan:  Status is: Inpatient Remains inpatient appropriate because: management of encephalopathy     Consultants:  none  Procedures:    Antimicrobials:    Subjective: She is alert, she was abel to tell me she was in the hospital. Feels weak, she doesn't know what happen, she was told by her daughter she was not herself.     Objective: Vitals:   09/17/22 2000 09/18/22 0509 09/18/22 2014 09/19/22 0546  BP: 119/62 121/67 (!) 116/59 120/62  Pulse: (!) 104 93 95 92  Resp: 18 18 18 18   Temp: 97.6 F (36.4 C) 97.8 F (36.6 C) 98.2 F (36.8 C) 97.7 F (36.5 C)  TempSrc: Oral Oral Oral Oral  SpO2: 96% 98% 93% 97%    Intake/Output Summary (Last 24 hours) at 09/19/2022 1535 Last data filed at 09/19/2022 1400 Gross per 24 hour  Intake 1536.08 ml  Output 200 ml  Net 1336.08 ml    There were no vitals filed for this visit.  Examination:  General exam: NAD Respiratory system: CTA Cardiovascular system: S 1, S 2 RRR Gastrointestinal system: BS present, soft, nt Central nervous system: Alert, oriented.  Extremities: symetric power.  Data Reviewed: I have personally reviewed following labs and imaging studies  CBC: Recent Labs  Lab 09/16/22 2149 09/17/22 1105 09/18/22 0350 09/19/22 0914  WBC 3.2* 2.1* 2.6* 2.4*  HGB 14.4 14.1 13.7 14.6  HCT 42.0 41.9 40.2 44.4  MCV 86.1 87.5 86.8 89.0  PLT 121* 109* 109* 97*    Basic Metabolic Panel: Recent Labs  Lab 09/16/22 2149 09/17/22 0229 09/17/22 1105 09/18/22 0350 09/19/22 0914  NA 135  --  134* 135 136  K  4.1  --  3.9 3.9 3.7  CL 99  --  103 104 105  CO2 26  --  23 23 20*  GLUCOSE 96  --  113* 108* 163*  BUN 19  --  13 14 11   CREATININE 0.84  --  0.69 0.82 0.78  CALCIUM 10.4*  --  9.0 8.8* 9.0  MG  --  1.9  --   --   --   PHOS  --  3.6  --   --   --     GFR: CrCl cannot be calculated (Unknown ideal weight.). Liver Function Tests: Recent Labs  Lab 09/16/22 2149 09/17/22 1105 09/18/22 0350  AST 42* 39 39  ALT 31 33 31  ALKPHOS 57 51 55  BILITOT 0.5 0.7 0.7  PROT 7.3 6.2* 6.4*  ALBUMIN 4.6 3.6 3.6    Recent Labs  Lab 09/17/22 0229  LIPASE 42    Recent Labs  Lab 09/17/22 0547 09/18/22 1300 09/19/22 0914  AMMONIA 23 43* 34    Coagulation Profile: No results for input(s): "INR", "PROTIME" in the last 168 hours. Cardiac Enzymes: No results for input(s): "CKTOTAL", "CKMB", "CKMBINDEX", "TROPONINI" in the last 168 hours. BNP (last 3 results) No results for input(s): "PROBNP" in the last 8760 hours. HbA1C: Recent Labs    09/18/22 0350  HGBA1C 6.1*    CBG: Recent Labs  Lab 09/18/22 1209 09/18/22 1608 09/18/22 2019 09/19/22 0811 09/19/22 1139  GLUCAP 110* 134* 167* 94 170*    Lipid Profile: No results for input(s): "CHOL", "HDL", "LDLCALC", "TRIG", "CHOLHDL", "LDLDIRECT" in the last 72 hours. Thyroid Function Tests: Recent Labs    09/17/22 1105  TSH 1.951    Anemia Panel: Recent Labs    09/17/22 1105  VITAMINB12 206  FOLATE 15.2    Sepsis Labs: No results for input(s): "PROCALCITON", "LATICACIDVEN" in the last 168 hours.  No results found for this or any previous visit (from the past 240 hour(s)).       Radiology Studies: No results found.      Scheduled Meds:  cyanocobalamin  1,000 mcg Intramuscular Daily   folic acid-pyridoxine-cyancobalamin  1 tablet Oral Daily   lactulose  20 g Oral BID   Continuous Infusions:  lactated ringers 75 mL/hr at 09/19/22 1400   thiamine (VITAMIN B1) injection Stopped (09/19/22 1215)     LOS:  2 days    Time spent: 35 minutes.     Alba Cory, MD [facility]   If 7PM-7AM, please contact night-coverage [email or web address]  09/19/2022, 3:35 PM

## 2022-09-19 NOTE — Progress Notes (Signed)
Mobility Specialist - Progress Note   09/19/22 1313  Mobility  Activity Transferred to/from [facility]  Level of Assistance +2 (takes two people)  Assistive Device [facility]  Distance Ambulated (ft) 5 ft  Range of Motion/Exercises Active  Activity Response Tolerated well  Mobility Referral Yes  $Mobility charge 1 Mobility  Mobility Specialist Start Time (ACUTE ONLY) 1305  Mobility Specialist Stop Time (ACUTE ONLY) 1312  Mobility Specialist Time Calculation (min) (ACUTE ONLY) 7 min   RN requested assistance for transfer to and from [facility]. Returned to chair with all needs met and family in room.   [name] Mobility Specialist

## 2022-09-19 NOTE — Progress Notes (Signed)
Occupational Therapy Treatment Patient Details Name: Brenda Pham MRN: [id number] DOB: 05/26/1940 Today's Date: 09/19/2022   History of present illness Brenda Pham is a 82 y.o. female admitted with intermittent disorientation and was found to have acute metabolic encephalopaty. PMH: significant for osteoarthritis, breast cancer, type 2 diabetes, hyperlipidemia,, internal hemorrhoids, colon polyps, left foot diabetic ulcer, abscess to left foot, diabetes.   OT comments  Pt making slow progress with all adls. Pt recalled date and place with 15 minute delay during session today and is starting to ask questions about why she is here and why she needs therapy. Pt continues to have significant cognitive impairments with no insight and will not be safe to return home alone at this point.  Pt with heavy posterior lean during all transfers and a fear of falling although was able to transfer sit to stand x4 today and get some weight shift forward by end of session.  Pt will need less than 3 hours of post acute rehab when d/c'd before going home.    Recommendations for follow up therapy are one component of a multi-disciplinary discharge planning process, led by the attending physician.  Recommendations may be updated based on patient status, additional functional criteria and insurance authorization.    Assistance Recommended at Discharge Frequent or constant Supervision/Assistance  Patient can return home with the following  A lot of help with walking and/or transfers;A lot of help with bathing/dressing/bathroom;Assistance with cooking/housework;Direct supervision/assist for medications management;Assist for transportation;Help with stairs or ramp for entrance;Direct supervision/assist for financial management   Equipment Recommendations       Recommendations for Other Services      Precautions / Restrictions Precautions Precautions: Fall Precaution Comments: posterior  lean Restrictions Weight Bearing Restrictions: No       Mobility Bed Mobility Overal bed mobility: Needs Assistance Bed Mobility: Supine to Sit     Supine to sit: Mod assist, HOB elevated     General bed mobility comments: Cues to use bedrails to come to full sit and step by step cues needed to move toward the edge of the bed.    Transfers Overall transfer level: Needs assistance Equipment used: Rolling walker (2 wheels) Transfers: Sit to/from Stand, Bed to chair/wheelchair/BSC Sit to Stand: Mod assist     Step pivot transfers: Mod assist     General transfer comment: Pt continues with posterior lean and inability to find center of gravity in standing even when walker lowered to attempt to get pt to push down through walker and shift weight forward. Pt with fear of falling.     Balance Overall balance assessment: Needs assistance Sitting-balance support: Feet supported, No upper extremity supported Sitting balance-Leahy Scale: Fair     Standing balance support: During functional activity, Reliant on assistive device for balance, Bilateral upper extremity supported Standing balance-Leahy Scale: Poor Standing balance comment: Pt must have outside support i standing or would fall posteriorly.                           ADL either performed or assessed with clinical judgement   ADL Overall ADL's : Needs assistance/impaired Eating/Feeding: Set up;Sitting Eating/Feeding Details (indicate cue type and reason): Pt able to indentify all food and utensils accurately. Grooming: Wash/dry hands;Wash/dry face;Cueing for sequencing;Sitting Grooming Details (indicate cue type and reason): Pt washed hands and face in chair before breakfast.  Cues to know what to do with washcloth but once pt got started did  not need assist.                 Toilet Transfer: Moderate assistance;Stand-pivot;BSC/3in1;Rolling walker (2 wheels) Toilet Transfer Details (indicate cue type  and reason): Pivot to [facility] with walkel.  Cues to put weight on balls of feet when transferring due to heavy posterior lean. Toileting- Clothing Manipulation and Hygiene: Moderate assistance;Sitting/lateral lean;Sit to/from stand Toileting - Clothing Manipulation Details (indicate cue type and reason): Pt cleaned self sitting with min assist to make sure pt was clean. Once in standing pt requires more assist to manage clothing due to heavy posterior lean and inability to let go of walker.     Functional mobility during ADLs: Moderate assistance;Rolling walker (2 wheels) General ADL Comments: Pt appeared slightly more aware of situation today which helped with adls. Pt asking "what is the purpose of therapy" and becoming more aware that she is not at the level she was at prior to admission.    Extremity/Trunk Assessment Upper Extremity Assessment Upper Extremity Assessment: Overall WFL for tasks assessed   Lower Extremity Assessment Lower Extremity Assessment: Defer to PT evaluation   Cervical / Trunk Assessment Cervical / Trunk Assessment: Kyphotic    Vision Baseline Vision/History: 1 Wears glasses Ability to See in Adequate Light: 0 Adequate Patient Visual Report: No change from baseline Vision Assessment?: No apparent visual deficits Additional Comments: Pt read clock on wall, read menu with glasses on. Glasses are only for reading.   Perception     Praxis      Cognition Arousal/Alertness: Awake/alert Behavior During Therapy: Flat affect Overall Cognitive Status: Impaired/Different from baseline Area of Impairment: Orientation, Attention, Memory, Following commands, Safety/judgement, Awareness, Problem solving                 Orientation Level: Situation (Pt was initially not oriented.  Pt did remember month/year and hospital after 20 minute delay) Current Attention Level: Focused Memory: Decreased recall of precautions, Decreased short-term memory Following Commands:  Follows one step commands consistently Safety/Judgement: Decreased awareness of safety, Decreased awareness of deficits Awareness: Emergent (Pt was asking if doctors were finding out why she is having so many issues.) Problem Solving: Slow processing, Decreased initiation, Difficulty sequencing, Requires verbal cues, Requires tactile cues General Comments: Pt appears to be mildly improved recalling month and year with a delay as well as location.  Pt continues to have very poor awareness asking when she can go home after requiring a great amount of assist to get up and into the chair.        Exercises      Shoulder Instructions       General Comments Pt most limited by poor balance and poor cognition at this time affecting safety with all adls adn mobility.    Pertinent Vitals/ Pain       Pain Assessment Pain Assessment: Faces Faces Pain Scale: Hurts a little bit Pain Location: R knee Pain Descriptors / Indicators: Aching Pain Intervention(s): Monitored during session, Repositioned  Home Living Family/patient expects to be discharged to:: Private residence Living Arrangements: Alone Available Help at Discharge: Family;Available PRN/intermittently Type of Home: House Home Access: Stairs to enter;Ramped entrance Entrance Stairs-Number of Steps: 3   Home Layout: One level     Bathroom Shower/Tub: Producer, television/film/video: Standard Bathroom Accessibility: Yes How Accessible: Accessible via walker Home Equipment: Standard Walker;Cane - single point;Shower seat          Prior Functioning/Environment  Frequency  Min 1X/week        Progress Toward Goals  OT Goals(current goals can now be found in the care plan section)     Acute Rehab OT Goals Patient Stated Goal: to go home OT Goal Formulation: With patient/family Time For Goal Achievement: 10/02/22 Potential to Achieve Goals: Good  Plan      Co-evaluation                  AM-PAC OT "6 Clicks" Daily Activity     Outcome Measure   Help from another person eating meals?: A Little Help from another person taking care of personal grooming?: A Little Help from another person toileting, which includes using toliet, bedpan, or urinal?: A Lot Help from another person bathing (including washing, rinsing, drying)?: A Lot Help from another person to put on and taking off regular upper body clothing?: A Little Help from another person to put on and taking off regular lower body clothing?: A Lot 6 Click Score: 15    End of Session Equipment Utilized During Treatment: Rolling walker (2 wheels);Gait belt  OT Visit Diagnosis: Unsteadiness on feet (R26.81);Muscle weakness (generalized) (M62.81);Other symptoms and signs involving cognitive function   Activity Tolerance Patient tolerated treatment well   Patient Left in chair;with call bell/phone within reach;with chair alarm set   Nurse Communication Mobility status        Time: 1610-9604 OT Time Calculation (min): 27 min  Charges: OT General Charges $OT Visit: 1 Visit OT Treatments $Self Care/Home Management : 23-37 mins   Hope Budds 09/19/2022, 8:34 AM

## 2022-09-20 DIAGNOSIS — G9340 Encephalopathy, unspecified: Secondary | ICD-10-CM | POA: Diagnosis not present

## 2022-09-20 LAB — GLUCOSE, CAPILLARY
Glucose-Capillary: 107 mg/dL — ABNORMAL HIGH (ref 70–99)
Glucose-Capillary: 200 mg/dL — ABNORMAL HIGH (ref 70–99)
Glucose-Capillary: 94 mg/dL (ref 70–99)
Glucose-Capillary: 114 mg/dL — ABNORMAL HIGH (ref 70–99)

## 2022-09-20 LAB — PARATHYROID HORMONE, INTACT (NO CA): PTH: 20 pg/mL (ref 15–65)

## 2022-09-20 MED ORDER — LACTULOSE 10 GM/15ML PO SOLN
30.0000 g | Freq: Two times a day (BID) | ORAL | Status: DC
  Administered 2022-09-20 – 2022-09-21 (×2): 30 g via ORAL
  Filled 2022-09-20 (×2): qty 45

## 2022-09-20 MED ORDER — SENNOSIDES-DOCUSATE SODIUM 8.6-50 MG PO TABS
1.0000 | ORAL_TABLET | Freq: Two times a day (BID) | ORAL | Status: DC
  Administered 2022-09-21: 1 via ORAL
  Filled 2022-09-20: qty 1

## 2022-09-20 MED ORDER — CYANOCOBALAMIN 1000 MCG/ML IJ SOLN
1000.0000 ug | Freq: Every day | INTRAMUSCULAR | Status: DC
  Administered 2022-09-21: 1000 ug via INTRAMUSCULAR
  Filled 2022-09-20: qty 1

## 2022-09-20 MED ORDER — THIAMINE HCL 100 MG/ML IJ SOLN
100.0000 mg | Freq: Every day | INTRAMUSCULAR | Status: DC
  Administered 2022-09-21: 100 mg via INTRAVENOUS
  Filled 2022-09-20: qty 2

## 2022-09-20 NOTE — Progress Notes (Signed)
Physical Therapy Treatment Patient Details Name: Brenda Pham MRN: [id number] DOB: 28-Jun-1940 Today's Date: 09/20/2022   History of Present Illness Brenda Pham is a 82 y.o. female admitted with intermittent disorientation and was found to have acute metabolic encephalopaty. PMH: significant for osteoarthritis, breast cancer, type 2 diabetes, hyperlipidemia,, internal hemorrhoids, colon polyps, left foot diabetic ulcer, abscess to left foot, diabetes.    PT Comments    General Comments: Cognition improving.  AxO x 2 following all commands and more conversive.  Not yet at prior Indep level.  Impaired safety cognition and poor memory/recall.  Assisted with amb.  General transfer comment: severe posterior lean and poor balance.  VC's hand placement and safety with turns.  Also asissted with toilet transfer.  Pt unable to steady self and perform self peri care.  HIGH FALL RISK.  General Gait Details: Very unsteady gait with severe posterior lean and poor self awareness/correction.  Limited distance amb to bathroom then in hallway.  Max c/o weakness/fatigue.  HIGH FALL RISK. General bed mobility comments: increased time and Min Assist to support B LE up onto bed. Pt lives home alone. Pt will need ST Rehab at SNF to address mobility and functional decline prior to safely returning home.   Recommendations for follow up therapy are one component of a multi-disciplinary discharge planning process, led by the attending physician.  Recommendations may be updated based on patient status, additional functional criteria and insurance authorization.  Follow Up Recommendations  Can patient physically be transported by private vehicle: No    Assistance Recommended at Discharge Frequent or constant Supervision/Assistance  Patient can return home with the following A lot of help with walking and/or transfers;A lot of help with bathing/dressing/bathroom;Help with stairs or ramp for entrance;Assistance  with cooking/housework;Direct supervision/assist for medications management;Direct supervision/assist for financial management;Assist for transportation   Equipment Recommendations  Rolling walker (2 wheels)    Recommendations for Other Services       Precautions / Restrictions Precautions Precautions: Fall Precaution Comments: posterior lean Restrictions Weight Bearing Restrictions: No     Mobility  Bed Mobility Overal bed mobility: Needs Assistance Bed Mobility: Sit to Supine     Supine to sit: Min assist     General bed mobility comments: increased time and Min Assist to support B LE up onto bed.    Transfers Overall transfer level: Needs assistance Equipment used: Rolling walker (2 wheels) Transfers: Sit to/from Stand, Bed to chair/wheelchair/BSC Sit to Stand: Mod assist, Max assist           General transfer comment: severe posterior lean and poor balance.  VC's hand placement and safety with turns.  Also asissted with toilet transfer.  Pt unable to steady self and perform self peri care.  HIGH FALL RISK.    Ambulation/Gait Ambulation/Gait assistance: Mod assist, Max assist Gait Distance (Feet): 22 Feet Assistive device: Rolling walker (2 wheels) Gait Pattern/deviations: Step-to pattern, Step-through pattern, Knees buckling, Staggering left, Staggering right, Trunk flexed, Narrow base of support Gait velocity: decreased     General Gait Details: Very unsteady gait with severe posterior lean and poor self awareness/correction.  Limited distance amb to bathroom then in hallway.  Max c/o weakness/fatigue.  HIGH FALL RISK.   Stairs             Wheelchair Mobility    Modified Rankin (Stroke Patients Only)       Balance  Cognition Arousal/Alertness: Awake/alert Behavior During Therapy: WFL for tasks assessed/performed Overall Cognitive Status: Within Functional Limits for tasks  assessed                                 General Comments: Cofnition improving.  AxO x 2 following all commands and more conversive.  Not yet at prior Indep level.  Impaired safety cognition and poor memory/recall.        Exercises      General Comments        Pertinent Vitals/Pain Pain Assessment Pain Assessment: No/denies pain Faces Pain Scale: Hurts a little bit Pain Location: R knee Pain Descriptors / Indicators: Tightness Pain Intervention(s): Monitored during session, Repositioned    Home Living                          Prior Function            PT Goals (current goals can now be found in the care plan section) Progress towards PT goals: Progressing toward goals    Frequency    Min 1X/week      PT Plan Current plan remains appropriate    Co-evaluation              AM-PAC PT "6 Clicks" Mobility   Outcome Measure  Help needed turning from your back to your side while in a flat bed without using bedrails?: A Lot Help needed moving from lying on your back to sitting on the side of a flat bed without using bedrails?: A Lot Help needed moving to and from a bed to a chair (including a wheelchair)?: A Lot Help needed standing up from a chair using your arms (e.g., wheelchair or bedside chair)?: A Lot Help needed to walk in hospital room?: A Lot Help needed climbing 3-5 steps with a railing? : A Lot 6 Click Score: 12    End of Session Equipment Utilized During Treatment: Gait belt Activity Tolerance: Patient limited by fatigue Patient left: in bed;with bed alarm set;with family/visitor present;with call bell/phone within reach Nurse Communication: Mobility status PT Visit Diagnosis: Difficulty in walking, not elsewhere classified (R26.2);Unsteadiness on feet (R26.81)     Time: 1610-9604 PT Time Calculation (min) (ACUTE ONLY): 29 min  Charges:  $Gait Training: 8-22 mins $Therapeutic Activity: 8-22 mins                      {Keandre Linden  PTA Acute  [employer] M-F          [phone]

## 2022-09-20 NOTE — TOC Progression Note (Signed)
Transition of Care [facility]) - Progression Note    Patient Details  Name: NATRICE MANIFOLD MRN: [id number] Date of Birth: October 02, 1940  Transition of Care [facility]) CM/SW Contact  Howell Rucks, RN Phone Number: 09/20/2022, 2:11 PM  Clinical Narrative:   Met with pt and dtr (Dawn) at  bedside, introduced role of TOC/NCM and review for dc plans, presented with bed offers for short term rehab-SNF, pt/family requests time to discuss. Will continue to follow.     Expected Discharge Plan: Skilled Nursing Facility Barriers to Discharge: No Barriers Identified  Expected Discharge Plan and Services     Post Acute Care Choice: Skilled Nursing Facility Living arrangements for the past 2 months: Independent Living Facility                                       Social Determinants of Health (SDOH) Interventions SDOH Screenings   Tobacco Use: Medium Risk (09/17/2022)    Readmission Risk Interventions    09/20/2022    2:11 PM  Readmission Risk Prevention Plan  Post Dischage Appt Complete  Medication Screening Complete  Transportation Screening Complete

## 2022-09-20 NOTE — Progress Notes (Addendum)
PROGRESS NOTE    Brenda Pham  VOZ:[id number] DOB: [birth year] DOA: 09/16/2022 PCP: Kaleen Mask, MD   Brief Narrative: 82 year old with past medical history significant for osteoarthritis breast cancer, Diabetes type 2, hyperlipidemia, internal hemorrhoids, colon polyps, left foot diabetic ulcer, history of left foot abscess presents to the ED with with intermittent disorientation for the past week.  She lives independently and is usually oriented x 4 according to daughter.  Patient has been able to answers simple questions but has not being able to elaborate. Per ED notes, at baseline patient is able to care for herself, she is very social and very friendly.  Daughter speaks with her 2 or 3 times per week.  Daughter reports that over the last 1 or 2 weeks she has noted the patient's behavior has been changing.  Patient has been missing her weekly hair salon appointments twice, which  is extremely unusual.  Daughter brought her home with her over Mother's Day to spend the weekend in [place].  Patient has been generally weak and had difficulty ambulating requiring assistance with a walker.  Patient had some mild improvement while she was at the patient's daughter.  When daughter spoke with her later in the week patient was noted to be more confused and had some difficulty finding words. Daughter report one episode of staring and hallucination.   Patient was found to have low B 12, MRI negative for Stroke, EEG negative. Her presentation could be related to Vitamin deficiency and Delirium of unclear etiology, Ammonia was also mildly elevated. She could also have some underline Dementia.   Patient is currently awaiting for Rehab.    Assessment & Plan:   Principal Problem:   Acute encephalopathy Active Problems:   Hyperlipidemia   Peripheral sensory neuropathy due to type 2 diabetes mellitus (HCC)   Type 2 diabetes mellitus (HCC)   Neutropenia (HCC)   Thrombocytopenia  (HCC)  1-Acute Metabolic Encephalopathy:  -Could be multifactorial: B 12 low at 200, started  supplement. Ammonia level mildly elevated 43. Started  low dose lactulose.  -Completed IV Thiamine high dose for 3 days. Continue with IV thiamine daily.  -MRI negative for stroke, chronic micro hemorrhages, which increases risk for dementia. She might have some underline diagnosis of dementia.  -EEG. Negative for seizure.  - RPR> negative  -Daughter notice progressive daily improvement in alertness, interaction. Still having memory problems.   2-Hyperlipidemia; not currently on therapy.   Diabetes Type 2;  SSI  Neutropenia, thrombocytopenia;  Replete B12.  Needs follow up for leukopenia, thrombocytopenia if no improvement after repletion of B 12.   Constipation; increase lactulose. Added senna  Estimated body mass index is 25.86 kg/m as calculated from the following:   Height as of 07/20/22: 5\' 3"  (1.6 m).   Weight as of 07/20/22: 66.2 kg.   DVT prophylaxis: SCD Code Status: Full code Family Communication: Daughter at bedside 5/21 Disposition Plan:  Status is: Inpatient Remains inpatient appropriate because: management of encephalopathy     Consultants:  none  Procedures:    Antimicrobials:    Subjective: She is more alert. She is still having memory problems. She only remember she ate eggs. Couldn't name grapes.  Daughter report daily progressive improvement.    Objective: Vitals:   09/19/22 1537 09/19/22 2014 09/20/22 0557 09/20/22 1227  BP: 118/72 136/67 130/79 108/61  Pulse: 84 96 83 88  Resp: 18 18 16 20   Temp: 97.8 F (36.6 C) 98.2 F (36.8 C) 98.2 F (36.8  C) 97.9 F (36.6 C)  TempSrc: Oral  Oral Oral  SpO2: 98% 100% 98% 99%    Intake/Output Summary (Last 24 hours) at 09/20/2022 1646 Last data filed at 09/20/2022 0300 Gross per 24 hour  Intake --  Output 800 ml  Net -800 ml    There were no vitals filed for this visit.  Examination:  General  exam: NAD Respiratory system: CTA Cardiovascular system: S 1, S 2 RRR Gastrointestinal system:BS present, soft, nt Central nervous system: Alert, follows command Extremities: Symmetric power.    Data Reviewed: I have personally reviewed following labs and imaging studies  CBC: Recent Labs  Lab 09/16/22 2149 09/17/22 1105 09/18/22 0350 10-05-2022 0914  WBC 3.2* 2.1* 2.6* 2.4*  HGB 14.4 14.1 13.7 14.6  HCT 42.0 41.9 40.2 44.4  MCV 86.1 87.5 86.8 89.0  PLT 121* 109* 109* 97*    Basic Metabolic Panel: Recent Labs  Lab 09/16/22 2149 09/17/22 0229 09/17/22 1105 09/18/22 0350 10-05-22 0914  NA 135  --  134* 135 136  K 4.1  --  3.9 3.9 3.7  CL 99  --  103 104 105  CO2 26  --  23 23 20*  GLUCOSE 96  --  113* 108* 163*  BUN 19  --  13 14 11   CREATININE 0.84  --  0.69 0.82 0.78  CALCIUM 10.4*  --  9.0 8.8* 9.0  MG  --  1.9  --   --   --   PHOS  --  3.6  --   --   --     GFR: CrCl cannot be calculated (Unknown ideal weight.). Liver Function Tests: Recent Labs  Lab 09/16/22 2149 09/17/22 1105 09/18/22 0350  AST 42* 39 39  ALT 31 33 31  ALKPHOS 57 51 55  BILITOT 0.5 0.7 0.7  PROT 7.3 6.2* 6.4*  ALBUMIN 4.6 3.6 3.6    Recent Labs  Lab 09/17/22 0229  LIPASE 42    Recent Labs  Lab 09/17/22 0547 09/18/22 1300 2022/10/05 0914  AMMONIA 23 43* 34    Coagulation Profile: No results for input(s): "INR", "PROTIME" in the last 168 hours. Cardiac Enzymes: No results for input(s): "CKTOTAL", "CKMB", "CKMBINDEX", "TROPONINI" in the last 168 hours. BNP (last 3 results) No results for input(s): "PROBNP" in the last 8760 hours. HbA1C: Recent Labs    09/18/22 0350  HGBA1C 6.1*    CBG: Recent Labs  Lab 10/05/2022 1644 05-Oct-2022 2134 09/20/22 0737 09/20/22 1137 09/20/22 1632  GLUCAP 107* 120* 107* 200* 94    Lipid Profile: No results for input(s): "CHOL", "HDL", "LDLCALC", "TRIG", "CHOLHDL", "LDLDIRECT" in the last 72 hours. Thyroid Function Tests: No  results for input(s): "TSH", "T4TOTAL", "FREET4", "T3FREE", "THYROIDAB" in the last 72 hours.  Anemia Panel: No results for input(s): "VITAMINB12", "FOLATE", "FERRITIN", "TIBC", "IRON", "RETICCTPCT" in the last 72 hours.  Sepsis Labs: No results for input(s): "PROCALCITON", "LATICACIDVEN" in the last 168 hours.  No results found for this or any previous visit (from the past 240 hour(s)).       Radiology Studies: EEG adult  Result Date: 05-Oct-2022 Charlsie Quest, MD     Oct 05, 2022  5:13 PM Patient Name: Brenda Pham MRN: [id number] Epilepsy Attending: Charlsie Quest Referring Physician/Provider: Alba Cory, MD Date: 2022-10-05 Duration: 23.08 mins Patient history: 81yo f with ams getting ege to evaluate for seizure. Level of alertness: Awake AEDs during EEG study: None Technical aspects: This EEG study was done with scalp  electrodes positioned according to the 10-20 International system of electrode placement. Electrical activity was reviewed with band pass filter of 1-70Hz , sensitivity of 7 uV/mm, display speed of 75mm/sec with a 60Hz  notched filter applied as appropriate. EEG data were recorded continuously and digitally stored.  Video monitoring was available and reviewed as appropriate. Description: The posterior dominant rhythm consists of 8Hz  activity of moderate voltage (25-35 uV) seen predominantly in posterior head regions, symmetric and reactive to eye opening and eye closing. Hyperventilation and photic stimulation were not performed.   IMPRESSION: This study is within normal limits. No seizures or epileptiform discharges were seen throughout the recording. A normal interictal EEG does not exclude the diagnosis of epilepsy. Priyanka Annabelle Harman        Scheduled Meds:  cyanocobalamin  1,000 mcg Intramuscular Daily   folic acid-pyridoxine-cyancobalamin  1 tablet Oral Daily   lactulose  30 g Oral BID   senna-docusate  1 tablet Oral BID   [START ON 09/21/2022] thiamine  (VITAMIN B1) injection  100 mg Intravenous Daily   Continuous Infusions:  lactated ringers 75 mL/hr at 09/19/22 1920     LOS: 3 days    Time spent: 35 minutes.     Alba Cory, MD [facility]   If 7PM-7AM, please contact night-coverage [email or web address]  09/20/2022, 4:46 PM

## 2022-09-20 NOTE — Progress Notes (Signed)
Mobility Specialist - Progress Note   09/20/22 0917  Mobility  Activity Transferred to/from [facility]  Level of Assistance Moderate assist, patient does 50-74%  Assistive Device BSC  Distance Ambulated (ft) 10 ft  Range of Motion/Exercises Active  Activity Response Tolerated well  Mobility Referral Yes  $Mobility charge 1 Mobility  Mobility Specialist Start Time (ACUTE ONLY) [id number]  Mobility Specialist Stop Time (ACUTE ONLY) 0912  Mobility Specialist Time Calculation (min) (ACUTE ONLY) 7 min   Pt received trying to get OOB to [facility], pt was Mod A 2+. Pt stood from [facility] and took small, shuffled steps to chair where she was left with all needs met and alarm on.  [name] Mobility Specialist

## 2022-09-21 DIAGNOSIS — G9340 Encephalopathy, unspecified: Secondary | ICD-10-CM | POA: Diagnosis not present

## 2022-09-21 LAB — CBC WITH DIFFERENTIAL/PLATELET
Abs Immature Granulocytes: 0.02 10*3/uL (ref 0.00–0.07)
Basophils Absolute: 0 10*3/uL (ref 0.0–0.1)
Basophils Relative: 1 %
Eosinophils Absolute: 0.1 10*3/uL (ref 0.0–0.5)
Eosinophils Relative: 3 %
HCT: 37.9 % (ref 36.0–46.0)
Hemoglobin: 12.7 g/dL (ref 12.0–15.0)
Immature Granulocytes: 0 %
Lymphocytes Relative: 44 %
Lymphs Abs: 2 10*3/uL (ref 0.7–4.0)
MCH: 29.1 pg (ref 26.0–34.0)
MCHC: 33.5 g/dL (ref 30.0–36.0)
MCV: 86.9 fL (ref 80.0–100.0)
Monocytes Absolute: 0.5 10*3/uL (ref 0.1–1.0)
Monocytes Relative: 12 %
Neutro Abs: 1.8 10*3/uL (ref 1.7–7.7)
Neutrophils Relative %: 40 %
Platelets: 126 10*3/uL — ABNORMAL LOW (ref 150–400)
RBC: 4.36 MIL/uL (ref 3.87–5.11)
RDW: 12.7 % (ref 11.5–15.5)
WBC: 4.6 10*3/uL (ref 4.0–10.5)
nRBC: 0 % (ref 0.0–0.2)

## 2022-09-21 LAB — BASIC METABOLIC PANEL
Anion gap: 6 (ref 5–15)
BUN: 12 mg/dL (ref 8–23)
CO2: 25 mmol/L (ref 22–32)
Calcium: 8.6 mg/dL — ABNORMAL LOW (ref 8.9–10.3)
Chloride: 104 mmol/L (ref 98–111)
Creatinine, Ser: 0.93 mg/dL (ref 0.44–1.00)
GFR, Estimated: >60 mL/min (ref >60)
Glucose, Bld: 120 mg/dL — ABNORMAL HIGH (ref 70–99)
Potassium: 3.7 mmol/L (ref 3.5–5.1)
Sodium: 135 mmol/L (ref 135–145)

## 2022-09-21 LAB — GLUCOSE, CAPILLARY
Glucose-Capillary: 106 mg/dL — ABNORMAL HIGH (ref 70–99)
Glucose-Capillary: 150 mg/dL — ABNORMAL HIGH (ref 70–99)

## 2022-09-21 MED ORDER — THIAMINE HCL 100 MG PO TABS: 100.0000 mg | ORAL_TABLET | Freq: Every day | ORAL | 0 refills | Status: AC

## 2022-09-21 MED ORDER — CYANOCOBALAMIN 1000 MCG/ML IJ SOLN: 1000.0000 ug | Freq: Every day | INTRAMUSCULAR | 0 refills | Status: AC

## 2022-09-21 MED ORDER — LACTULOSE 10 GM/15ML PO SOLN: 30.0000 g | Freq: Two times a day (BID) | ORAL | 0 refills | Status: AC

## 2022-09-21 MED ORDER — FA-PYRIDOXINE-CYANOCOBALAMIN 2.5-25-2 MG PO TABS: 1.0000 | ORAL_TABLET | Freq: Every day | ORAL | 0 refills | Status: DC

## 2022-09-21 MED ORDER — LORAZEPAM 2 MG/ML IJ SOLN
0.5000 mg | Freq: Once | INTRAMUSCULAR | Status: AC | PRN
  Administered 2022-09-21: 0.5 mg via INTRAVENOUS
  Filled 2022-09-21: qty 1

## 2022-09-21 NOTE — Care Management Important Message (Signed)
Important Message  Patient Details IM Letter given Name: Brenda Pham MRN: [id number] Date of Birth: 1940/08/27   Medicare Important Message Given:  Yes     Caren Macadam 09/21/2022, 10:05 AM

## 2022-09-21 NOTE — TOC Transition Note (Signed)
Transition of Care [facility]) - CM/SW Discharge Note   Patient Details  Name: Brenda Pham MRN: [id number] Date of Birth: 01-22-1941  [facility] CM/SW Contact:  Otelia Santee, LCSW Phone Number: 09/21/2022, 10:54 AM   Clinical Narrative:    Spoke with pt's daughter who has accepted bed offer for SNF at [facility]. Pt is able to transfer to their facility today. Pt will be going to room 502p. RN to call report to [phone]. Pt will be transported to facility via [facility]. [facility] arranged at 11:20am.    Final next level of care: Skilled Nursing Facility Barriers to Discharge: No Barriers Identified   Patient Goals and CMS Choice CMS Medicare.gov Compare Post Acute Care list provided to:: Patient Choice offered to / list presented to : Patient, Adult Children  Discharge Placement     Existing PASRR number confirmed : 09/19/22          Patient chooses bed at: [facility] Patient to be transferred to facility by: [facility] Name of family member notified: Kennith Gain Patient and family notified of of transfer: 09/21/22  Discharge Plan and Services Additional resources added to the After Visit Summary for       Post Acute Care Choice: Skilled Nursing Facility          DME Arranged: N/A DME Agency: NA                  Social Determinants of Health (SDOH) Interventions SDOH Screenings   Tobacco Use: Medium Risk (09/17/2022)     Readmission Risk Interventions    09/20/2022    2:11 PM  Readmission Risk Prevention Plan  Post Dischage Appt Complete  Medication Screening Complete  Transportation Screening Complete

## 2022-09-21 NOTE — Progress Notes (Signed)
Called report to [employer], Charity fundraiser at [employer].

## 2022-09-21 NOTE — Discharge Summary (Signed)
Physician Discharge Summary  [name] RUE:[id number] DOB: [birth year] DOA: 09/16/2022  PCP: Kaleen Mask, MD  Admit date: 09/16/2022 Discharge date: 09/21/2022  Admitted From: Home Disposition: [facility]    Recommendations for Outpatient Follow-up:  Follow up with PCP in 1-2 weeks Referral placed to neurology for further evaluation and management encephalopathy, concern for cognitive impairment/early dementia and would benefit from outpatient neuropsych testing  Discharge Condition: Stable CODE STATUS: Full code Diet recommendation: Heart healthy/consistent carbohydrate diet  History of present illness:  Brenda Pham is a 82 year old female with past medical history significant for breast cancer, osteoarthritis, type 2 diabetes mellitus (diet-controlled), HLD, internal hemorrhoids, history of colon polyps, history of left foot diabetic ulcer/abscess who presented to [facility] ED on 5/18 with confusion, disorientation.  Per daughter, onset over the last week.  At baseline usually oriented x 4, lives alone; daughter speaks with her mother 2-3 times per week..  Daughter reports that she has been able to answer simple questions but has not been able to elaborate further.  Additionally patient has been missing her weekly hair salon appointments which is extremely unusual.  Additionally daughter reports that her mother has been generally weak with difficulty ambulating requiring assistance with a walker.  Daughter also reported 1 episode of staring and hallucination.  Patient underwent CT head, MR brain with no acute findings but MR brain noted scattered cortical thalamic microhemorrhages that are nonspecific.  Patient was noted to have a low B12 level and was started on supplementation.  Etiology of her symptoms likely related to vitamin deficiency, slightly elevated ammonia level versus concern for some cognitive impairment/early dementia.  Patient will be  discharging to SNF.  Would recommend follow-up with neurology outpatient for further evaluation, consideration of neuropsych testing.  Hospital course:  Acute metabolic encephalopathy Patient presenting with confusion.  Episode of staring with hallucination.  TSH within normal limits.  RPR nonreactive.  Urinalysis unrevealing.  EtOH level less than 10.  CT head without contrast with no acute findings.  MR brain with no acute findings but noted scattered cortical thalamic microhemorrhages that are nonspecific.  EEG with no epileptic form or seizure activity noted B12 slightly low at 200.  Folate 15.2.  Ammonia level slightly elevated 43.  Etiology likely multifactorial given vitamin deficiency, versus slightly elevated ammonia level versus concern for possible of cognitive impairment with early mild dementia.  Continue B12 supplementation, lactulose.  Outpatient follow-up with neurology, would benefit from neuropsych testing.  Hyperlipidemia Currently not therapy outpatient.  Type 2 diabetes mellitus, diet controlled Continue consistent carbohydrate diet.  Hemoglobin A1c 6.1.  Constipation Continue lactulose  Weakness/debility/gait disturbance/deconditioning: Discharging to SNF for further rehabilitation.  Discharge Diagnoses:  Principal Problem:   Acute encephalopathy Active Problems:   Hyperlipidemia   Peripheral sensory neuropathy due to type 2 diabetes mellitus (HCC)   Type 2 diabetes mellitus (HCC)   Neutropenia (HCC)   Thrombocytopenia [facility])    Discharge Instructions  Discharge Instructions     Ambulatory referral to Neurology   Complete by: As directed    An appointment is requested in approximately: 2 weeks.   Call MD for:  difficulty breathing, headache or visual disturbances   Complete by: As directed    Call MD for:  extreme fatigue   Complete by: As directed    Call MD for:  persistant dizziness or light-headedness   Complete by: As directed    Call MD for:   persistant nausea and vomiting   Complete by:  As directed    Call MD for:  severe uncontrolled pain   Complete by: As directed    Call MD for:  temperature >100.4   Complete by: As directed    Diet - low sodium heart healthy   Complete by: As directed    Increase activity slowly   Complete by: As directed       Allergies as of 09/21/2022       Reactions   Sulfa Antibiotics Rash   Keflex [cephalexin]    Niacin And Related    Extreme flushing, heart racing   Other    "Clindamycin"-Antibiotic caused thrush.    Septra [sulfamethoxazole-trimethoprim]         Medication List     STOP taking these medications    atorvastatin 20 MG tablet Commonly known as: LIPITOR   metFORMIN 500 MG (MOD) 24 hr tablet Commonly known as: GLUMETZA   naproxen sodium 220 MG tablet Commonly known as: ALEVE       TAKE these medications    alendronate 70 MG tablet Commonly known as: FOSAMAX Take 70 mg by mouth once a week.   cyanocobalamin 1000 MCG/ML injection Commonly known as: VITAMIN B12 Inject 1 mL (1,000 mcg total) into the muscle daily for 3 days. Start taking on: Sep 23, 2022   folic acid-pyridoxine-cyancobalamin 2.5-25-2 MG Tabs tablet Commonly known as: FOLTX Take 1 tablet by mouth daily. Start taking on: Sep 22, 2022   lactulose 10 GM/15ML solution Commonly known as: CHRONULAC Take 45 mLs (30 g total) by mouth 2 (two) times daily.   thiamine 100 MG tablet Commonly known as: VITAMIN B1 Take 1 tablet (100 mg total) by mouth daily.        Follow-up Information     Kaleen Mask, MD. Schedule an appointment as soon as possible for a visit in 1 week(s).   Specialty: Family Medicine Contact information: [address] [place] [address] [phone]         [facility]. Schedule an appointment as soon as possible for a visit.   Specialty: Neurology Contact information: [address] [place] [address] [phone]               Allergies  Allergen Reactions   Sulfa Antibiotics Rash   Keflex [Cephalexin]    Niacin And Related     Extreme flushing, heart racing   Other     "Clindamycin"-Antibiotic caused thrush.    Septra [Sulfamethoxazole-Trimethoprim]     Consultations: None   Procedures/Studies: EEG adult  Result Date: 10/10/2022 Charlsie Quest, MD     2022-10-10  5:13 PM Patient Name: EFRATA GENTHER MRN: [id number] Epilepsy Attending: Charlsie Quest Referring Physician/Provider: Alba Cory, MD Date: 10-10-22 Duration: 23.08 mins Patient history: 81yo f with ams getting ege to evaluate for seizure. Level of alertness: Awake AEDs during EEG study: None Technical aspects: This EEG study was done with scalp electrodes positioned according to the 10-20 International system of electrode placement. Electrical activity was reviewed with band pass filter of 1-70Hz , sensitivity of 7 uV/mm, display speed of 41mm/sec with a 60Hz  notched filter applied as appropriate. EEG data were recorded continuously and digitally stored.  Video monitoring was available and reviewed as appropriate. Description: The posterior dominant rhythm consists of 8Hz  activity of moderate voltage (25-35 uV) seen predominantly in posterior head regions, symmetric and reactive to eye opening and eye closing. Hyperventilation and photic stimulation were not  performed.   IMPRESSION: This study is within normal limits. No seizures or epileptiform discharges were seen throughout the recording. A normal interictal EEG does not exclude the diagnosis of epilepsy. Charlsie Quest   MR BRAIN WO CONTRAST  Result Date: 09/17/2022 CLINICAL DATA:  Mental status change, unknown cause EXAM: MRI HEAD WITHOUT CONTRAST TECHNIQUE: Multiplanar, multiecho pulse sequences of the brain and surrounding structures were obtained without intravenous contrast. COMPARISON:  CT head May 17, 24. FINDINGS:  Brain: No acute infarction, acute hemorrhage, hydrocephalus, extra-axial collection or mass lesion. Mild for age chronic microvascular ischemic change. Scattered punctate foci of susceptibility artifact, compatible with chronic microhemorrhages that are predominantly in the cortex. Vascular: Major arterial flow voids are maintained at the skull base. Skull and upper cervical spine: Normal marrow signal. Sinuses/Orbits: Clear sinuses.  No acute orbital findings. IMPRESSION: 1. No evidence of acute intracranial abnormality. 2. Scattered predominantly cortical chronic microhemorrhages, which is nonspecific but can be seen with amyloid angiopathy. Electronically Signed   By: Feliberto Harts M.D.   On: 09/17/2022 14:36   CT Head Wo Contrast  Result Date: 09/16/2022 CLINICAL DATA:  Altered mental status EXAM: CT HEAD WITHOUT CONTRAST TECHNIQUE: Contiguous axial images were obtained from the base of the skull through the vertex without intravenous contrast. RADIATION DOSE REDUCTION: This exam was performed according to the departmental dose-optimization program which includes automated exposure control, adjustment of the mA and/or kV according to patient size and/or use of iterative reconstruction technique. COMPARISON:  None Available. FINDINGS: Brain: Normal anatomic configuration. Parenchymal volume loss is commensurate with the patient's age. Mild periventricular white matter changes are present likely reflecting the sequela of small vessel ischemia. No abnormal intra or extra-axial mass lesion or fluid collection. No abnormal mass effect or midline shift. No evidence of acute intracranial hemorrhage or infarct. Ventricular size is normal. Cerebellum unremarkable. Vascular: No asymmetric hyperdense vasculature at the skull base. Skull: Intact Sinuses/Orbits: Paranasal sinuses are clear. Orbits are unremarkable. Other: Mastoid air cells and middle ear cavities are clear. IMPRESSION: 1. No acute intracranial  hemorrhage or infarct. 2. Mild senescent change. Electronically Signed   By: Helyn Numbers M.D.   On: 09/16/2022 22:57   DG Chest Port 1 View  Result Date: 09/16/2022 CLINICAL DATA:  Altered mental status.  [id number]. EXAM: PORTABLE CHEST 1 VIEW COMPARISON:  Chest CT 05/26/2022, 07/01/2020 FINDINGS: Heart size and vasculature are normal apart from calcification of the transverse aorta. The mediastinum is normally outlined. No vascular congestion is seen. The sulci are sharp. The lungs are mildly emphysematous with chronic subpleural reticulation. Chronic and stable 1.5 cm lobular nodule right lower lung field, in the middle lobe on CT. No new radiographic abnormality is seen. No active infiltrates. Right axillary surgical clips. There is osteopenia with thoracic spondylosis. Surgical clips right axilla. Left breast implant. Numerous overlying monitor wires right-greater-than-left. IMPRESSION: No acute radiographic chest findings. Stable right middle lobe nodule. COPD and chronic interstitial change. Aortic atherosclerosis. Osteopenia and degenerative change. Electronically Signed   By: Almira Bar M.D.   On: 09/16/2022 22:44     Subjective: Patient seen examined bedside, resting,.  Lying in bed.  RN present.  Continues with mild confusion, unable to state where she is currently located other than [place], but does know the year and the president.  Continues on B12 supplementation.  Discharging to SNF today.  No other questions or concerns at this time.  Denies headache, no dizziness, no chest pain, no shortness of breath, no abdominal  pain.  No acute events overnight per nurse staff.  Discharge Exam: Vitals:   09/20/22 1227 09/20/22 2152  BP: 108/61 (!) 146/74  Pulse: 88 98  Resp: 20 17  Temp: 97.9 F (36.6 C) 98 F (36.7 C)  SpO2: 99% 98%   Vitals:   09/19/22 2014 09/20/22 0557 09/20/22 1227 09/20/22 2152  BP: 136/67 130/79 108/61 (!) 146/74  Pulse: 96 83 88 98  Resp: 18 16 20 17    Temp:  98.2 F (36.8 C) 97.9 F (36.6 C) 98 F (36.7 C)  TempSrc:  Oral Oral Oral  SpO2: 100% 98% 99% 98%    Physical Exam: GEN: NAD, alert and oriented  to person Actor), Time (2024), but not place (able to answer [place], but no other location given), elderly in appearance HEENT: NCAT, PERRL, EOMI, sclera clear, MMM PULM: CTAB w/o wheezes/crackles, normal respiratory effort, on room air CV: RRR w/o M/G/R GI: abd soft, NTND, NABS, no R/G/M MSK: no peripheral edema, moves all extremities independently NEURO: CN II-XII intact, no focal deficits, sensation to light touch intact PSYCH: normal mood/affect    The results of significant diagnostics from this hospitalization (including imaging, microbiology, ancillary and laboratory) are listed below for reference.     Microbiology: No results found for this or any previous visit (from the past 240 hour(s)).   Labs: BNP (last 3 results) No results for input(s): "BNP" in the last 8760 hours. Basic Metabolic Panel: Recent Labs  Lab 09/16/22 2149 09/17/22 0229 09/17/22 1105 09/18/22 0350 09/19/22 0914 09/21/22 0415  NA 135  --  134* 135 136 135  K 4.1  --  3.9 3.9 3.7 3.7  CL 99  --  103 104 105 104  CO2 26  --  23 23 20* 25  GLUCOSE 96  --  113* 108* 163* 120*  BUN 19  --  13 14 11 12   CREATININE 0.84  --  0.69 0.82 0.78 0.93  CALCIUM 10.4*  --  9.0 8.8* 9.0 8.6*  MG  --  1.9  --   --   --   --   PHOS  --  3.6  --   --   --   --    Liver Function Tests: Recent Labs  Lab 09/16/22 2149 09/17/22 1105 09/18/22 0350  AST 42* 39 39  ALT 31 33 31  ALKPHOS 57 51 55  BILITOT 0.5 0.7 0.7  PROT 7.3 6.2* 6.4*  ALBUMIN 4.6 3.6 3.6   Recent Labs  Lab 09/17/22 0229  LIPASE 42   Recent Labs  Lab 09/17/22 0547 09/18/22 1300 09/19/22 0914  AMMONIA 23 43* 34   CBC: Recent Labs  Lab 09/16/22 2149 09/17/22 1105 09/18/22 0350 09/19/22 0914 09/21/22 0415  WBC 3.2* 2.1* 2.6* 2.4* 4.6  NEUTROABS  --    --   --   --  1.8  HGB 14.4 14.1 13.7 14.6 12.7  HCT 42.0 41.9 40.2 44.4 37.9  MCV 86.1 87.5 86.8 89.0 86.9  PLT 121* 109* 109* 97* 126*   Cardiac Enzymes: No results for input(s): "CKTOTAL", "CKMB", "CKMBINDEX", "TROPONINI" in the last 168 hours. BNP: Invalid input(s): "POCBNP" CBG: Recent Labs  Lab 09/20/22 0737 09/20/22 1137 09/20/22 1632 09/20/22 2207 09/21/22 0753  GLUCAP 107* 200* 94 114* 106*   D-Dimer No results for input(s): "DDIMER" in the last 72 hours. Hgb A1c No results for input(s): "HGBA1C" in the last 72 hours. Lipid Profile No results for input(s): "CHOL", "HDL", "LDLCALC", "  TRIG", "CHOLHDL", "LDLDIRECT" in the last 72 hours. Thyroid function studies No results for input(s): "TSH", "T4TOTAL", "T3FREE", "THYROIDAB" in the last 72 hours.  Invalid input(s): "FREET3" Anemia work up No results for input(s): "VITAMINB12", "FOLATE", "FERRITIN", "TIBC", "IRON", "RETICCTPCT" in the last 72 hours. Urinalysis    Component Value Date/Time   COLORURINE YELLOW 09/16/2022 2152   APPEARANCEUR CLEAR 09/16/2022 2152   LABSPEC 1.009 09/16/2022 2152   PHURINE 5.5 09/16/2022 2152   GLUCOSEU NEGATIVE 09/16/2022 2152   HGBUR NEGATIVE 09/16/2022 2152   BILIRUBINUR NEGATIVE 09/16/2022 2152   KETONESUR NEGATIVE 09/16/2022 2152   PROTEINUR NEGATIVE 09/16/2022 2152   UROBILINOGEN 0.2 07/02/2014 1145   NITRITE NEGATIVE 09/16/2022 2152   LEUKOCYTESUR NEGATIVE 09/16/2022 2152   Sepsis Labs Recent Labs  Lab 09/17/22 1105 09/18/22 0350 09/19/22 0914 09/21/22 0415  WBC 2.1* 2.6* 2.4* 4.6   Microbiology No results found for this or any previous visit (from the past 240 hour(s)).   Time coordinating discharge: Over 30 minutes  SIGNED:   Alvira Philips [place], DO  [facility] 09/21/2022, 11:02 AM

## 2022-09-28 DIAGNOSIS — R2689 Other abnormalities of gait and mobility: Secondary | ICD-10-CM | POA: Diagnosis not present

## 2022-09-28 DIAGNOSIS — R609 Edema, unspecified: Secondary | ICD-10-CM | POA: Diagnosis not present

## 2022-09-28 DIAGNOSIS — J449 Chronic obstructive pulmonary disease, unspecified: Secondary | ICD-10-CM | POA: Diagnosis not present

## 2022-09-28 DIAGNOSIS — E1140 Type 2 diabetes mellitus with diabetic neuropathy, unspecified: Secondary | ICD-10-CM | POA: Diagnosis not present

## 2022-10-05 DIAGNOSIS — E1140 Type 2 diabetes mellitus with diabetic neuropathy, unspecified: Secondary | ICD-10-CM | POA: Diagnosis not present

## 2022-10-05 DIAGNOSIS — R2689 Other abnormalities of gait and mobility: Secondary | ICD-10-CM | POA: Diagnosis not present

## 2022-10-05 DIAGNOSIS — J449 Chronic obstructive pulmonary disease, unspecified: Secondary | ICD-10-CM | POA: Diagnosis not present

## 2022-10-07 DIAGNOSIS — R2689 Other abnormalities of gait and mobility: Secondary | ICD-10-CM

## 2022-10-07 DIAGNOSIS — E1140 Type 2 diabetes mellitus with diabetic neuropathy, unspecified: Secondary | ICD-10-CM

## 2022-10-07 DIAGNOSIS — J449 Chronic obstructive pulmonary disease, unspecified: Secondary | ICD-10-CM

## 2022-10-07 DIAGNOSIS — R609 Edema, unspecified: Secondary | ICD-10-CM

## 2022-10-10 DIAGNOSIS — E1140 Type 2 diabetes mellitus with diabetic neuropathy, unspecified: Secondary | ICD-10-CM | POA: Diagnosis not present

## 2022-10-10 DIAGNOSIS — J449 Chronic obstructive pulmonary disease, unspecified: Secondary | ICD-10-CM | POA: Diagnosis not present

## 2022-10-10 DIAGNOSIS — R2689 Other abnormalities of gait and mobility: Secondary | ICD-10-CM | POA: Diagnosis not present

## 2022-10-10 DIAGNOSIS — R609 Edema, unspecified: Secondary | ICD-10-CM | POA: Diagnosis not present

## 2022-10-13 DIAGNOSIS — J449 Chronic obstructive pulmonary disease, unspecified: Secondary | ICD-10-CM | POA: Diagnosis not present

## 2022-10-13 DIAGNOSIS — R609 Edema, unspecified: Secondary | ICD-10-CM | POA: Diagnosis not present

## 2022-10-13 DIAGNOSIS — E1140 Type 2 diabetes mellitus with diabetic neuropathy, unspecified: Secondary | ICD-10-CM | POA: Diagnosis not present

## 2022-10-13 DIAGNOSIS — R2689 Other abnormalities of gait and mobility: Secondary | ICD-10-CM | POA: Diagnosis not present

## 2022-10-25 DIAGNOSIS — Z7689 Persons encountering health services in other specified circumstances: Secondary | ICD-10-CM

## 2022-10-25 DIAGNOSIS — E1140 Type 2 diabetes mellitus with diabetic neuropathy, unspecified: Secondary | ICD-10-CM | POA: Diagnosis not present

## 2022-10-25 DIAGNOSIS — R609 Edema, unspecified: Secondary | ICD-10-CM | POA: Diagnosis not present

## 2022-10-25 DIAGNOSIS — J449 Chronic obstructive pulmonary disease, unspecified: Secondary | ICD-10-CM | POA: Diagnosis not present

## 2022-10-25 DIAGNOSIS — R2689 Other abnormalities of gait and mobility: Secondary | ICD-10-CM | POA: Diagnosis not present

## 2022-10-26 DIAGNOSIS — Z7689 Persons encountering health services in other specified circumstances: Secondary | ICD-10-CM

## 2022-10-26 DIAGNOSIS — E1140 Type 2 diabetes mellitus with diabetic neuropathy, unspecified: Secondary | ICD-10-CM | POA: Diagnosis not present

## 2022-10-26 DIAGNOSIS — I872 Venous insufficiency (chronic) (peripheral): Secondary | ICD-10-CM

## 2022-10-26 DIAGNOSIS — J449 Chronic obstructive pulmonary disease, unspecified: Secondary | ICD-10-CM | POA: Diagnosis not present

## 2022-10-26 DIAGNOSIS — R609 Edema, unspecified: Secondary | ICD-10-CM | POA: Diagnosis not present

## 2022-10-26 DIAGNOSIS — R2689 Other abnormalities of gait and mobility: Secondary | ICD-10-CM | POA: Diagnosis not present

## 2022-10-28 DIAGNOSIS — R2689 Other abnormalities of gait and mobility: Secondary | ICD-10-CM | POA: Diagnosis not present

## 2022-10-28 DIAGNOSIS — I872 Venous insufficiency (chronic) (peripheral): Secondary | ICD-10-CM | POA: Diagnosis not present

## 2022-10-28 DIAGNOSIS — J449 Chronic obstructive pulmonary disease, unspecified: Secondary | ICD-10-CM | POA: Diagnosis not present

## 2022-10-28 DIAGNOSIS — E1140 Type 2 diabetes mellitus with diabetic neuropathy, unspecified: Secondary | ICD-10-CM | POA: Diagnosis not present

## 2022-10-28 DIAGNOSIS — Z7689 Persons encountering health services in other specified circumstances: Secondary | ICD-10-CM

## 2022-10-28 DIAGNOSIS — R609 Edema, unspecified: Secondary | ICD-10-CM

## 2022-11-01 DIAGNOSIS — E1140 Type 2 diabetes mellitus with diabetic neuropathy, unspecified: Secondary | ICD-10-CM | POA: Diagnosis not present

## 2022-11-01 DIAGNOSIS — R2689 Other abnormalities of gait and mobility: Secondary | ICD-10-CM | POA: Diagnosis not present

## 2022-11-01 DIAGNOSIS — Z7689 Persons encountering health services in other specified circumstances: Secondary | ICD-10-CM

## 2022-11-01 DIAGNOSIS — R609 Edema, unspecified: Secondary | ICD-10-CM | POA: Diagnosis not present

## 2022-11-01 DIAGNOSIS — I872 Venous insufficiency (chronic) (peripheral): Secondary | ICD-10-CM

## 2022-11-01 DIAGNOSIS — J449 Chronic obstructive pulmonary disease, unspecified: Secondary | ICD-10-CM | POA: Diagnosis not present

## 2022-11-07 DIAGNOSIS — I872 Venous insufficiency (chronic) (peripheral): Secondary | ICD-10-CM

## 2022-11-07 DIAGNOSIS — E1140 Type 2 diabetes mellitus with diabetic neuropathy, unspecified: Secondary | ICD-10-CM | POA: Diagnosis not present

## 2022-11-07 DIAGNOSIS — R4182 Altered mental status, unspecified: Secondary | ICD-10-CM

## 2022-11-07 DIAGNOSIS — R2689 Other abnormalities of gait and mobility: Secondary | ICD-10-CM | POA: Diagnosis not present

## 2022-11-07 DIAGNOSIS — R41841 Cognitive communication deficit: Secondary | ICD-10-CM

## 2022-11-07 DIAGNOSIS — J449 Chronic obstructive pulmonary disease, unspecified: Secondary | ICD-10-CM | POA: Diagnosis not present

## 2022-11-07 DIAGNOSIS — R609 Edema, unspecified: Secondary | ICD-10-CM | POA: Diagnosis not present

## 2022-11-22 ENCOUNTER — Inpatient Hospital Stay (HOSPITAL_COMMUNITY)
Admission: EM | Admit: 2022-11-22 | Discharge: 2022-11-28 | DRG: 481 | Disposition: A | Discharge status: Skilled Nursing Facility | Payer: Medicare Other | Source: Skilled Nursing Facility | Attending: Internal Medicine | Admitting: Internal Medicine

## 2022-11-22 ENCOUNTER — Emergency Department (HOSPITAL_COMMUNITY): Payer: Medicare Other

## 2022-11-22 ENCOUNTER — Emergency Department (HOSPITAL_COMMUNITY)
Admission: EM | Admit: 2022-11-22 | Discharge: 2022-11-22 | Disposition: A | Discharge status: Skilled Nursing Facility | Payer: Medicare Other | Source: Home / Self Care | Attending: Emergency Medicine | Admitting: Emergency Medicine

## 2022-11-22 ENCOUNTER — Other Ambulatory Visit: Payer: Self-pay

## 2022-11-22 ENCOUNTER — Encounter (HOSPITAL_COMMUNITY): Payer: Self-pay | Admitting: Emergency Medicine

## 2022-11-22 DIAGNOSIS — Z87891 Personal history of nicotine dependence: Secondary | ICD-10-CM

## 2022-11-22 DIAGNOSIS — E871 Hypo-osmolality and hyponatremia: Secondary | ICD-10-CM | POA: Diagnosis not present

## 2022-11-22 DIAGNOSIS — F05 Delirium due to known physiological condition: Secondary | ICD-10-CM | POA: Diagnosis not present

## 2022-11-22 DIAGNOSIS — Z8249 Family history of ischemic heart disease and other diseases of the circulatory system: Secondary | ICD-10-CM

## 2022-11-22 DIAGNOSIS — F0390 Unspecified dementia without behavioral disturbance: Secondary | ICD-10-CM | POA: Diagnosis not present

## 2022-11-22 DIAGNOSIS — Z7983 Long term (current) use of bisphosphonates: Secondary | ICD-10-CM

## 2022-11-22 DIAGNOSIS — Z923 Personal history of irradiation: Secondary | ICD-10-CM | POA: Diagnosis not present

## 2022-11-22 DIAGNOSIS — E119 Type 2 diabetes mellitus without complications: Secondary | ICD-10-CM

## 2022-11-22 DIAGNOSIS — Y92128 Other place in nursing home as the place of occurrence of the external cause: Secondary | ICD-10-CM

## 2022-11-22 DIAGNOSIS — Z881 Allergy status to other antibiotic agents status: Secondary | ICD-10-CM

## 2022-11-22 DIAGNOSIS — Z841 Family history of disorders of kidney and ureter: Secondary | ICD-10-CM

## 2022-11-22 DIAGNOSIS — M810 Age-related osteoporosis without current pathological fracture: Secondary | ICD-10-CM | POA: Diagnosis not present

## 2022-11-22 DIAGNOSIS — E538 Deficiency of other specified B group vitamins: Secondary | ICD-10-CM | POA: Diagnosis present

## 2022-11-22 DIAGNOSIS — Z833 Family history of diabetes mellitus: Secondary | ICD-10-CM

## 2022-11-22 DIAGNOSIS — M25551 Pain in right hip: Secondary | ICD-10-CM | POA: Diagnosis present

## 2022-11-22 DIAGNOSIS — E785 Hyperlipidemia, unspecified: Secondary | ICD-10-CM | POA: Diagnosis present

## 2022-11-22 DIAGNOSIS — Y92009 Unspecified place in unspecified non-institutional (private) residence as the place of occurrence of the external cause: Secondary | ICD-10-CM | POA: Insufficient documentation

## 2022-11-22 DIAGNOSIS — Z9011 Acquired absence of right breast and nipple: Secondary | ICD-10-CM | POA: Diagnosis not present

## 2022-11-22 DIAGNOSIS — Z96651 Presence of right artificial knee joint: Secondary | ICD-10-CM | POA: Insufficient documentation

## 2022-11-22 DIAGNOSIS — W19XXXA Unspecified fall, initial encounter: Secondary | ICD-10-CM

## 2022-11-22 DIAGNOSIS — W0110XA Fall on same level from slipping, tripping and stumbling with subsequent striking against unspecified object, initial encounter: Secondary | ICD-10-CM | POA: Insufficient documentation

## 2022-11-22 DIAGNOSIS — Z853 Personal history of malignant neoplasm of breast: Secondary | ICD-10-CM | POA: Diagnosis not present

## 2022-11-22 DIAGNOSIS — Z882 Allergy status to sulfonamides status: Secondary | ICD-10-CM | POA: Diagnosis not present

## 2022-11-22 DIAGNOSIS — S72009A Fracture of unspecified part of neck of unspecified femur, initial encounter for closed fracture: Secondary | ICD-10-CM | POA: Diagnosis present

## 2022-11-22 DIAGNOSIS — S0990XA Unspecified injury of head, initial encounter: Secondary | ICD-10-CM | POA: Insufficient documentation

## 2022-11-22 DIAGNOSIS — S72001A Fracture of unspecified part of neck of right femur, initial encounter for closed fracture: Secondary | ICD-10-CM

## 2022-11-22 DIAGNOSIS — K5900 Constipation, unspecified: Secondary | ICD-10-CM | POA: Diagnosis present

## 2022-11-22 DIAGNOSIS — D649 Anemia, unspecified: Secondary | ICD-10-CM

## 2022-11-22 DIAGNOSIS — M7989 Other specified soft tissue disorders: Secondary | ICD-10-CM | POA: Diagnosis not present

## 2022-11-22 DIAGNOSIS — R600 Localized edema: Secondary | ICD-10-CM

## 2022-11-22 DIAGNOSIS — Z66 Do not resuscitate: Secondary | ICD-10-CM | POA: Diagnosis present

## 2022-11-22 DIAGNOSIS — E1140 Type 2 diabetes mellitus with diabetic neuropathy, unspecified: Secondary | ICD-10-CM | POA: Diagnosis not present

## 2022-11-22 DIAGNOSIS — S72001D Fracture of unspecified part of neck of right femur, subsequent encounter for closed fracture with routine healing: Secondary | ICD-10-CM | POA: Diagnosis not present

## 2022-11-22 DIAGNOSIS — W1839XA Other fall on same level, initial encounter: Secondary | ICD-10-CM | POA: Diagnosis present

## 2022-11-22 DIAGNOSIS — Z7984 Long term (current) use of oral hypoglycemic drugs: Secondary | ICD-10-CM | POA: Diagnosis not present

## 2022-11-22 DIAGNOSIS — D638 Anemia in other chronic diseases classified elsewhere: Secondary | ICD-10-CM | POA: Diagnosis not present

## 2022-11-22 DIAGNOSIS — Z888 Allergy status to other drugs, medicaments and biological substances status: Secondary | ICD-10-CM

## 2022-11-22 DIAGNOSIS — S72141A Displaced intertrochanteric fracture of right femur, initial encounter for closed fracture: Secondary | ICD-10-CM | POA: Diagnosis not present

## 2022-11-22 DIAGNOSIS — D62 Acute posthemorrhagic anemia: Secondary | ICD-10-CM | POA: Diagnosis not present

## 2022-11-22 LAB — CBC WITH DIFFERENTIAL/PLATELET
Abs Immature Granulocytes: 0.05 10*3/uL (ref 0.00–0.07)
Basophils Absolute: 0.1 10*3/uL (ref 0.0–0.1)
Basophils Relative: 1 %
Eosinophils Absolute: 0.1 10*3/uL (ref 0.0–0.5)
Eosinophils Relative: 1 %
HCT: 35.7 % — ABNORMAL LOW (ref 36.0–46.0)
Hemoglobin: 11.3 g/dL — ABNORMAL LOW (ref 12.0–15.0)
Immature Granulocytes: 1 %
Lymphocytes Relative: 12 %
Lymphs Abs: 1.1 10*3/uL (ref 0.7–4.0)
MCH: 26.5 pg (ref 26.0–34.0)
MCHC: 31.7 g/dL (ref 30.0–36.0)
MCV: 83.8 fL (ref 80.0–100.0)
Monocytes Absolute: 0.7 10*3/uL (ref 0.1–1.0)
Monocytes Relative: 8 %
Neutro Abs: 7.5 10*3/uL (ref 1.7–7.7)
Neutrophils Relative %: 77 %
Platelets: 158 10*3/uL (ref 150–400)
RBC: 4.26 MIL/uL (ref 3.87–5.11)
RDW: 14.9 % (ref 11.5–15.5)
WBC: 9.5 10*3/uL (ref 4.0–10.5)
nRBC: 0 % (ref 0.0–0.2)

## 2022-11-22 LAB — BASIC METABOLIC PANEL
Anion gap: 10 (ref 5–15)
BUN: 19 mg/dL (ref 8–23)
CO2: 24 mmol/L (ref 22–32)
Calcium: 9 mg/dL (ref 8.9–10.3)
Chloride: 100 mmol/L (ref 98–111)
Creatinine, Ser: 0.78 mg/dL (ref 0.44–1.00)
GFR, Estimated: >60 mL/min (ref >60)
Glucose, Bld: 187 mg/dL — ABNORMAL HIGH (ref 70–99)
Potassium: 3.7 mmol/L (ref 3.5–5.1)
Sodium: 134 mmol/L — ABNORMAL LOW (ref 135–145)

## 2022-11-22 MED ORDER — MORPHINE SULFATE (PF) 2 MG/ML IV SOLN
0.5000 mg | INTRAVENOUS | Status: DC | PRN
  Administered 2022-11-23 (×2): 0.5 mg via INTRAVENOUS
  Filled 2022-11-22 (×2): qty 1

## 2022-11-22 MED ORDER — MORPHINE SULFATE (PF) 4 MG/ML IV SOLN
4.0000 mg | Freq: Once | INTRAVENOUS | Status: AC
  Administered 2022-11-22: 4 mg via INTRAVENOUS
  Filled 2022-11-22: qty 1

## 2022-11-22 MED ORDER — SODIUM CHLORIDE 0.9 % IV SOLN: INTRAVENOUS | Status: AC

## 2022-11-22 MED ORDER — METHOCARBAMOL 1000 MG/10ML IJ SOLN
500.0000 mg | Freq: Three times a day (TID) | INTRAVENOUS | Status: DC | PRN
  Administered 2022-11-27: 500 mg via INTRAVENOUS
  Filled 2022-11-22: qty 500

## 2022-11-22 MED ORDER — ONDANSETRON HCL 4 MG/2ML IJ SOLN
4.0000 mg | Freq: Once | INTRAMUSCULAR | Status: AC
  Administered 2022-11-22: 4 mg via INTRAVENOUS
  Filled 2022-11-22: qty 2

## 2022-11-22 MED ORDER — OXYCODONE HCL 5 MG PO TABS: 5.0000 mg | ORAL_TABLET | ORAL | Status: DC | PRN

## 2022-11-22 MED ORDER — NALOXONE HCL 0.4 MG/ML IJ SOLN: 0.4000 mg | INTRAMUSCULAR | Status: DC | PRN

## 2022-11-22 NOTE — ED Provider Notes (Signed)
[place] EMERGENCY DEPARTMENT AT [facility] Provider Note   CSN: [id number] Arrival date & time: 11/22/22  1702     History  Chief Complaint  Patient presents with   Leg Pain    KASEN SAKO is a 82 y.o. female history of osteoporosis here presenting with right hip pain.  Patient resides at room [facility].  Patient states that she had a mechanical fall yesterday.  Patient went to the ER and had CT head and cervical spine that was unremarkable.  Patient also has right knee x-ray that was unremarkable.  Patient was sent back to the facility.  However patient complains that she has right hip pain and was unable to even get off the wheelchair.  Patient is sent here for hip x-rays.  Patient had knee replacement done by Dr. Charlann Boxer and she will prefer to stay in the same group  The history is provided by the patient.       Home Medications Prior to Admission medications   Medication Sig Start Date End Date Taking? Authorizing Provider  alendronate (FOSAMAX) 70 MG tablet Take 70 mg by mouth once a week. 06/06/20   [provider]  folic acid-pyridoxine-cyancobalamin (FOLTX) 2.5-25-2 MG TABS tablet Take 1 tablet by mouth daily. 09/22/22   [place], Alvira Philips, DO      Allergies    Sulfa antibiotics, Keflex [cephalexin], Niacin and related, Other, and Septra [sulfamethoxazole-trimethoprim]    Review of Systems   Review of Systems  Musculoskeletal:        Right hip pain  All other systems reviewed and are negative.   Physical Exam Updated Vital Signs BP (!) 102/54   Pulse (!) 106   Temp 98.3 F (36.8 C) (Oral) Comment: Simultaneous filing. User may not have seen previous data. Comment (Src): Simultaneous filing. User may not have seen previous data.  Resp 18   SpO2 100%  Physical Exam Vitals and nursing note reviewed.  Constitutional:      Comments: Uncomfortable and chronically ill  HENT:     Head: Normocephalic and atraumatic.     Nose: Nose normal.      Mouth/Throat:     Mouth: Mucous membranes are moist.  Eyes:     Extraocular Movements: Extraocular movements intact.     Pupils: Pupils are equal, round, and reactive to light.  Cardiovascular:     Rate and Rhythm: Normal rate and regular rhythm.     Pulses: Normal pulses.     Heart sounds: Normal heart sounds.  Pulmonary:     Effort: Pulmonary effort is normal.     Breath sounds: Normal breath sounds.  Abdominal:     General: Abdomen is flat.     Palpations: Abdomen is soft.  Musculoskeletal:     Cervical back: Normal range of motion and neck supple.     Comments: + Right hip tenderness and tenderness in the proximal femur.  Patient has previous right knee replacement and able to range the right knee.  Skin:    Capillary Refill: Capillary refill takes less than 2 seconds.  Neurological:     General: No focal deficit present.     Mental Status: She is oriented to person, place, and time.  Psychiatric:        Mood and Affect: Mood normal.     ED Results / Procedures / Treatments   Labs (all labs ordered are listed, but only abnormal results are displayed) Labs Reviewed  CBC WITH DIFFERENTIAL/PLATELET  BASIC  METABOLIC PANEL    EKG None  Radiology CT Head Wo Contrast  Result Date: 11/22/2022 CLINICAL DATA:  82 year old female with history of head trauma from a fall. EXAM: CT HEAD WITHOUT CONTRAST CT CERVICAL SPINE WITHOUT CONTRAST TECHNIQUE: Multidetector CT imaging of the head and cervical spine was performed following the standard protocol without intravenous contrast. Multiplanar CT image reconstructions of the cervical spine were also generated. RADIATION DOSE REDUCTION: This exam was performed according to the departmental dose-optimization program which includes automated exposure control, adjustment of the mA and/or kV according to patient size and/or use of iterative reconstruction technique. COMPARISON:  No priors. FINDINGS: CT HEAD FINDINGS Brain: Mild cerebral  atrophy. Patchy and confluent areas of decreased attenuation are noted throughout the deep and periventricular white matter of the cerebral hemispheres bilaterally, compatible with chronic microvascular ischemic disease. Physiologic calcifications in the basal ganglia bilaterally are incidentally noted. No evidence of acute infarction, hemorrhage, hydrocephalus, extra-axial collection or mass lesion/mass effect. Vascular: No hyperdense vessel or unexpected calcification. Skull: Normal. Negative for fracture or focal lesion. Sinuses/Orbits: No acute finding. Other: None. CT CERVICAL SPINE FINDINGS Alignment: Normal. Skull base and vertebrae: No acute fracture. No primary bone lesion or focal pathologic process. Soft tissues and spinal canal: No prevertebral fluid or swelling. No visible canal hematoma. Disc levels: Multilevel degenerative disc disease, most evident at C5-C6. Mild to moderate multilevel facet arthropathy bilaterally. Upper chest: Unremarkable. Other: None. IMPRESSION: 1. No evidence of significant acute traumatic injury to the skull, brain or cervical spine. 2. Mild cerebral atrophy with chronic microvascular ischemic changes in the cerebral white matter, as above. 3. Mild multilevel degenerative disc disease and cervical spondylosis, as above. Electronically Signed   By: Trudie Reed M.D.   On: 11/22/2022 05:52   CT Cervical Spine Wo Contrast  Result Date: 11/22/2022 CLINICAL DATA:  82 year old female with history of head trauma from a fall. EXAM: CT HEAD WITHOUT CONTRAST CT CERVICAL SPINE WITHOUT CONTRAST TECHNIQUE: Multidetector CT imaging of the head and cervical spine was performed following the standard protocol without intravenous contrast. Multiplanar CT image reconstructions of the cervical spine were also generated. RADIATION DOSE REDUCTION: This exam was performed according to the departmental dose-optimization program which includes automated exposure control, adjustment of the mA  and/or kV according to patient size and/or use of iterative reconstruction technique. COMPARISON:  No priors. FINDINGS: CT HEAD FINDINGS Brain: Mild cerebral atrophy. Patchy and confluent areas of decreased attenuation are noted throughout the deep and periventricular white matter of the cerebral hemispheres bilaterally, compatible with chronic microvascular ischemic disease. Physiologic calcifications in the basal ganglia bilaterally are incidentally noted. No evidence of acute infarction, hemorrhage, hydrocephalus, extra-axial collection or mass lesion/mass effect. Vascular: No hyperdense vessel or unexpected calcification. Skull: Normal. Negative for fracture or focal lesion. Sinuses/Orbits: No acute finding. Other: None. CT CERVICAL SPINE FINDINGS Alignment: Normal. Skull base and vertebrae: No acute fracture. No primary bone lesion or focal pathologic process. Soft tissues and spinal canal: No prevertebral fluid or swelling. No visible canal hematoma. Disc levels: Multilevel degenerative disc disease, most evident at C5-C6. Mild to moderate multilevel facet arthropathy bilaterally. Upper chest: Unremarkable. Other: None. IMPRESSION: 1. No evidence of significant acute traumatic injury to the skull, brain or cervical spine. 2. Mild cerebral atrophy with chronic microvascular ischemic changes in the cerebral white matter, as above. 3. Mild multilevel degenerative disc disease and cervical spondylosis, as above. Electronically Signed   By: Trudie Reed M.D.   On: 11/22/2022 05:52  DG Knee Complete 4 Views Right  Result Date: 11/22/2022 CLINICAL DATA:  Fall EXAM: RIGHT KNEE - COMPLETE 4+ VIEW COMPARISON:  None Available. FINDINGS: Changes of right knee replacement. No hardware complicating feature. No acute bony abnormality. Specifically, no fracture, subluxation, or dislocation. No joint effusion. IMPRESSION: Right knee replacement.  No acute bony abnormality. Electronically Signed   By: Charlett Nose M.D.    On: 11/22/2022 03:39    Procedures Procedures    Medications Ordered in ED Medications  morphine (PF) 4 MG/ML injection 4 mg (has no administration in time range)  ondansetron (ZOFRAN) injection 4 mg (has no administration in time range)    ED Course/ Medical Decision Making/ A&P                             Medical Decision Making NAKEESHA BOWLER is a 82 y.o. female here presenting with right hip pain.  Concern for hip versus femur fracture versus pelvic fracture.  Plan to get labs and pelvis and femur x-rays.  Will give pain medicines and reassess  10:04 PM X-ray showed acute proximal femoral intertrochanteric fracture.  Patient had knee replacement done with Dr. Charlann Boxer.  Family requested to stay in the same practice.  I discussed with Dr. Linna Caprice from [facility].  He recommend hospitalist admission and pain control and he will see patient tomorrow.  He recommend n.p.o. after midnight.  Problems Addressed: Closed displaced intertrochanteric fracture of right femur, initial encounter [facility]): acute illness or injury  Amount and/or Complexity of Data Reviewed Labs: ordered. Decision-making details documented in ED Course. Radiology: ordered and independent interpretation performed. Decision-making details documented in ED Course.  Risk Prescription drug management. Decision regarding hospitalization.    Final Clinical Impression(s) / ED Diagnoses Final diagnoses:  None    Rx / DC Orders ED Discharge Orders     None         Charlynne Pander, MD 11/22/22 2206

## 2022-11-22 NOTE — ED Notes (Signed)
ED TO INPATIENT HANDOFF REPORT  Name/Age/Gender Brenda Pham 82 y.o. female  Code Status    Code Status Orders  (From admission, onward)           Start     Ordered   11/22/22 2301  Do not attempt resuscitation (DNR)  Continuous       Question Answer Comment  If patient has no pulse and is not breathing Do Not Attempt Resuscitation   If patient has a pulse and/or is breathing: Medical Treatment Goals LIMITED ADDITIONAL INTERVENTIONS: Use medication/IV fluids and cardiac monitoring as indicated; Do not use intubation or mechanical ventilation (DNI), also provide comfort medications.  Transfer to Progressive/Stepdown as indicated, avoid Intensive Care.   Consent: Discussion documented in EHR or advanced directives reviewed      11/22/22 2300           Code Status History     Date Active Date Inactive Code Status Order ID Comments User Context   09/17/2022 0902 09/21/2022 1721 Full Code [id number]  Bobette Mo, MD Inpatient   07/07/2014 1306 07/09/2014 1956 Full Code [id number]  Shelly Coss Inpatient       Home/SNF/Other Skilled nursing facility  Chief Complaint Hip fracture [facility]) [S72.009A]  Level of Care/Admitting Diagnosis ED Disposition     ED Disposition  Admit   Condition  --   Comment  Hospital Area: [facility] [[id number]]  Level of Care: Med-Surg [16]  May admit patient to [facility] or [facility] if equivalent level of care is available:: Yes  Covid Evaluation: Asymptomatic - no recent exposure (last 10 days) testing not required  Diagnosis: Hip fracture [facility]) [[id number]]  Admitting Physician: John Giovanni [[id number]]  Attending Physician: John Giovanni [[id number]]  Certification:: I certify this patient will need inpatient services for at least 2 midnights  Estimated Length of Stay: 2          Medical History Past Medical History:  Diagnosis Date   Arthritis    osteoarthritis -knees, hands   Cancer  (HCC)    '80-right breast, surgery and radiation.   Diabetes mellitus without complication (HCC)    recently dx. no oral meds or insulin.   Hyperlipidemia    PONV (postoperative nausea and vomiting)     Allergies Allergies  Allergen Reactions   Sulfa Antibiotics Rash   Keflex [Cephalexin]    Niacin And Related     Extreme flushing, heart racing   Other     "Clindamycin"-Antibiotic caused thrush.    Septra [Sulfamethoxazole-Trimethoprim]     IV Location/Drains/Wounds Patient Lines/Drains/Airways Status     Active Line/Drains/Airways     Name Placement date Placement time Site Days   Peripheral IV 11/22/22 22 G 1" Anterior;Left Forearm 11/22/22  1945  Forearm  less than 1            Labs/Imaging Results for orders placed or performed during the hospital encounter of 11/22/22 (from the past 48 hour(s))  CBC with Differential     Status: Abnormal   Collection Time: 11/22/22  7:49 PM  Result Value Ref Range   WBC 9.5 4.0 - 10.5 K/uL   RBC 4.26 3.87 - 5.11 MIL/uL   Hemoglobin 11.3 (L) 12.0 - 15.0 g/dL   HCT 01.6 (L) 01.0 - 93.2 %   MCV 83.8 80.0 - 100.0 fL   MCH 26.5 26.0 - 34.0 pg   MCHC 31.7 30.0 - 36.0 g/dL   RDW 35.5 73.2 - 20.2 %  Platelets 158 150 - 400 K/uL   nRBC 0.0 0.0 - 0.2 %   Neutrophils Relative % 77 %   Neutro Abs 7.5 1.7 - 7.7 K/uL   Lymphocytes Relative 12 %   Lymphs Abs 1.1 0.7 - 4.0 K/uL   Monocytes Relative 8 %   Monocytes Absolute 0.7 0.1 - 1.0 K/uL   Eosinophils Relative 1 %   Eosinophils Absolute 0.1 0.0 - 0.5 K/uL   Basophils Relative 1 %   Basophils Absolute 0.1 0.0 - 0.1 K/uL   Immature Granulocytes 1 %   Abs Immature Granulocytes 0.05 0.00 - 0.07 K/uL    Comment: Performed at [facility], [address] [place], [place] [address]  Basic metabolic panel     Status: Abnormal   Collection Time: 11/22/22  7:49 PM  Result Value Ref Range   Sodium 134 (L) 135 - 145 mmol/L   Potassium 3.7 3.5 - 5.1 mmol/L    Chloride 100 98 - 111 mmol/L   CO2 24 22 - 32 mmol/L   Glucose, Bld 187 (H) 70 - 99 mg/dL    Comment: Glucose reference range applies only to samples taken after fasting for at least 8 hours.   BUN 19 8 - 23 mg/dL   Creatinine, Ser 1.91 0.44 - 1.00 mg/dL   Calcium 9.0 8.9 - 47.8 mg/dL   GFR, Estimated >29 >56 mL/min    Comment: (NOTE) Calculated using the CKD-EPI Creatinine Equation (2021)    Anion gap 10 5 - 15    Comment: Performed at [facility], [address], [place], [place] [address]   DG Pelvis 1-2 Views  Result Date: 11/22/2022 CLINICAL DATA:  Fall yesterday.  Persistent right hip pain. EXAM: PELVIS - 1-2 VIEW; RIGHT FEMUR 2 VIEWS COMPARISON:  AP pelvis 03/17/2021 FINDINGS: There is diffuse decreased bone mineralization. There is an acute proximal femoral intertrochanteric fracture with up to approximately 5 mm fracture line diastasis but otherwise no significant displacement. Status post total right knee arthroplasty without evidence of hardware failure. No right knee joint effusion. Surgical clips overlie the pelvis. Mild-to-moderate atherosclerotic calcifications. IMPRESSION: Acute proximal femoral intertrochanteric fracture. Electronically Signed   By: Neita Garnet M.D.   On: 11/22/2022 20:04   DG Femur Min 2 Views Right  Result Date: 11/22/2022 CLINICAL DATA:  Fall yesterday.  Persistent right hip pain. EXAM: PELVIS - 1-2 VIEW; RIGHT FEMUR 2 VIEWS COMPARISON:  AP pelvis 03/17/2021 FINDINGS: There is diffuse decreased bone mineralization. There is an acute proximal femoral intertrochanteric fracture with up to approximately 5 mm fracture line diastasis but otherwise no significant displacement. Status post total right knee arthroplasty without evidence of hardware failure. No right knee joint effusion. Surgical clips overlie the pelvis. Mild-to-moderate atherosclerotic calcifications. IMPRESSION: Acute proximal femoral intertrochanteric fracture. Electronically  Signed   By: Neita Garnet M.D.   On: 11/22/2022 20:04   DG Chest 1 View  Result Date: 11/22/2022 CLINICAL DATA:  Fall yesterday. EXAM: CHEST  1 VIEW COMPARISON:  AP chest 09/16/2022, CT chest 05/26/2022, CT chest 07/01/2020, chest two views 08/07/2017 FINDINGS: The patient is rotated to the left. A skin fold overlies the superior right lung and inferior left lung. Heart size is not well evaluated given projection. Mediastinal contours are grossly unremarkable but also limited by projection. Right axillary surgical clips are again seen. Long-term stable right middle lobe nodule as seen on prior 05/26/2022 and 07/01/2020 CTs. Left breast prosthesis again noted. Moderately decreased lung volumes with  bibasilar bronchovascular crowding. No definitive focal airspace opacity. No pleural effusion or pneumothorax. No acute skeletal abnormality. Surgical clips again overlie the inferior right breast. IMPRESSION: 1. No acute cardiopulmonary process. 2. Chronic findings as above. Electronically Signed   By: Neita Garnet M.D.   On: 11/22/2022 20:02   CT Head Wo Contrast  Result Date: 11/22/2022 CLINICAL DATA:  82 year old female with history of head trauma from a fall. EXAM: CT HEAD WITHOUT CONTRAST CT CERVICAL SPINE WITHOUT CONTRAST TECHNIQUE: Multidetector CT imaging of the head and cervical spine was performed following the standard protocol without intravenous contrast. Multiplanar CT image reconstructions of the cervical spine were also generated. RADIATION DOSE REDUCTION: This exam was performed according to the departmental dose-optimization program which includes automated exposure control, adjustment of the mA and/or kV according to patient size and/or use of iterative reconstruction technique. COMPARISON:  No priors. FINDINGS: CT HEAD FINDINGS Brain: Mild cerebral atrophy. Patchy and confluent areas of decreased attenuation are noted throughout the deep and periventricular white matter of the cerebral  hemispheres bilaterally, compatible with chronic microvascular ischemic disease. Physiologic calcifications in the basal ganglia bilaterally are incidentally noted. No evidence of acute infarction, hemorrhage, hydrocephalus, extra-axial collection or mass lesion/mass effect. Vascular: No hyperdense vessel or unexpected calcification. Skull: Normal. Negative for fracture or focal lesion. Sinuses/Orbits: No acute finding. Other: None. CT CERVICAL SPINE FINDINGS Alignment: Normal. Skull base and vertebrae: No acute fracture. No primary bone lesion or focal pathologic process. Soft tissues and spinal canal: No prevertebral fluid or swelling. No visible canal hematoma. Disc levels: Multilevel degenerative disc disease, most evident at C5-C6. Mild to moderate multilevel facet arthropathy bilaterally. Upper chest: Unremarkable. Other: None. IMPRESSION: 1. No evidence of significant acute traumatic injury to the skull, brain or cervical spine. 2. Mild cerebral atrophy with chronic microvascular ischemic changes in the cerebral white matter, as above. 3. Mild multilevel degenerative disc disease and cervical spondylosis, as above. Electronically Signed   By: Trudie Reed M.D.   On: 11/22/2022 05:52   CT Cervical Spine Wo Contrast  Result Date: 11/22/2022 CLINICAL DATA:  82 year old female with history of head trauma from a fall. EXAM: CT HEAD WITHOUT CONTRAST CT CERVICAL SPINE WITHOUT CONTRAST TECHNIQUE: Multidetector CT imaging of the head and cervical spine was performed following the standard protocol without intravenous contrast. Multiplanar CT image reconstructions of the cervical spine were also generated. RADIATION DOSE REDUCTION: This exam was performed according to the departmental dose-optimization program which includes automated exposure control, adjustment of the mA and/or kV according to patient size and/or use of iterative reconstruction technique. COMPARISON:  No priors. FINDINGS: CT HEAD FINDINGS  Brain: Mild cerebral atrophy. Patchy and confluent areas of decreased attenuation are noted throughout the deep and periventricular white matter of the cerebral hemispheres bilaterally, compatible with chronic microvascular ischemic disease. Physiologic calcifications in the basal ganglia bilaterally are incidentally noted. No evidence of acute infarction, hemorrhage, hydrocephalus, extra-axial collection or mass lesion/mass effect. Vascular: No hyperdense vessel or unexpected calcification. Skull: Normal. Negative for fracture or focal lesion. Sinuses/Orbits: No acute finding. Other: None. CT CERVICAL SPINE FINDINGS Alignment: Normal. Skull base and vertebrae: No acute fracture. No primary bone lesion or focal pathologic process. Soft tissues and spinal canal: No prevertebral fluid or swelling. No visible canal hematoma. Disc levels: Multilevel degenerative disc disease, most evident at C5-C6. Mild to moderate multilevel facet arthropathy bilaterally. Upper chest: Unremarkable. Other: None. IMPRESSION: 1. No evidence of significant acute traumatic injury to the skull, brain or cervical spine.  2. Mild cerebral atrophy with chronic microvascular ischemic changes in the cerebral white matter, as above. 3. Mild multilevel degenerative disc disease and cervical spondylosis, as above. Electronically Signed   By: Trudie Reed M.D.   On: 11/22/2022 05:52   DG Knee Complete 4 Views Right  Result Date: 11/22/2022 CLINICAL DATA:  Fall EXAM: RIGHT KNEE - COMPLETE 4+ VIEW COMPARISON:  None Available. FINDINGS: Changes of right knee replacement. No hardware complicating feature. No acute bony abnormality. Specifically, no fracture, subluxation, or dislocation. No joint effusion. IMPRESSION: Right knee replacement.  No acute bony abnormality. Electronically Signed   By: Charlett Nose M.D.   On: 11/22/2022 03:39    Pending Labs Unresulted Labs (From admission, onward)    None       Vitals/Pain Today's Vitals    11/22/22 2120 11/22/22 2130 11/22/22 2131 11/22/22 2215  BP:  125/68  118/71  Pulse:  100  99  Resp:  16  16  Temp: 98.3 F (36.8 C)     TempSrc: Axillary     SpO2:  93%  98%  PainSc:   Asleep     Isolation Precautions No active isolations  Medications Medications  morphine (PF) 4 MG/ML injection 4 mg (4 mg Intravenous Given 11/22/22 1946)  ondansetron (ZOFRAN) injection 4 mg (4 mg Intravenous Given 11/22/22 1947)    Mobility non-ambulatory

## 2022-11-22 NOTE — H&P (Signed)
History and Physical    Brenda Pham WUJ:[id number] DOB: 10-01-1940 DOA: 11/22/2022  PCP: Eloisa Northern, MD  Patient coming from: [facility]  Chief Complaint: Right hip pain  HPI: Brenda Pham is a 82 y.o. female with medical history significant of diet-controlled type 2 diabetes, hyperlipidemia, history of breast cancer, osteoporosis, B12 deficiency.  She was evaluated in the ED earlier today after a mechanical fall.  CT head/C-spine and x-ray of right knee were negative for acute findings.  She was discharged back to her facility.  Patient returns to the ED this evening complaining of right hip pain.  X-ray of right femur showing acute proximal femoral intertrochanteric fracture.  Chest x-ray showing no acute cardiopulmonary disease.  Labs showing no leukocytosis, hemoglobin 11.3 (was 12.7 on labs 2 months ago), MCV 83.8, sodium 134, glucose 187, creatinine 0.7.  ED physician discussed the case with Dr. Linna Caprice with [facility], recommended hospitalist admission for pain control and their team will see the patient tomorrow.  Recommended keeping n.p.o. after midnight.  Patient received morphine and Zofran in the ED.  Patient is currently somnolent after receiving morphine for pain.  She is able to wake up and tell me that she had a fall but not able to give any additional information.  Daughter and son-in-law at bedside.  Daughter states patient has advanced dementia and is currently at the memory care unit of [facility].  Family was told that she had a fall sometime late last night/early this morning and was evaluated in the emergency room.  Since after the fall, patient has been complaining of pain in her right hip and has not been able to bear weight on this leg.  She does not take any blood thinners.  Daughter states patient has chronic intermittent lower extremity edema for which she has been treated with Lasix but for the past week her right leg is more swollen.   She is not aware of patient having any history of blood clots.  Review of Systems:  Review of Systems  All other systems reviewed and are negative.   Past Medical History:  Diagnosis Date   Arthritis    osteoarthritis -knees, hands   Cancer (HCC)    '80-right breast, surgery and radiation.   Diabetes mellitus without complication (HCC)    recently dx. no oral meds or insulin.   Hyperlipidemia    PONV (postoperative nausea and vomiting)     Past Surgical History:  Procedure Laterality Date   AMPUTATION Left 11/18/2021   Procedure: LEFT 5TH AMPUTATION RAY;  Surgeon: Toni Arthurs, MD;  Location: [facility];  Service: Orthopedics;  Laterality: Left;  local by surgeon 30 okay per Tammy   APPENDECTOMY     BREAST SURGERY     Rt. breast modified mastectomy/(reconstuction)-last '03 tranflap(tissue from abdomen); and left subcutaneoues mastectomy and implant left breast-last implant '02.   CATARACT EXTRACTION, BILATERAL     last 06-11-14 left   DILATION AND CURETTAGE OF UTERUS     FINGER SURGERY Right    right ring finger-excsion cyst and nodules.   FOOT SURGERY Left    '10- 2nd toe    KNEE ARTHROSCOPY Right    KNEE ARTHROSCOPY Left    scope with hammer toe, '06- scope for torn cartilage   TOTAL KNEE ARTHROPLASTY Right 07/07/2014   Procedure: RIGHT TOTAL KNEE ARTHROPLASTY;  Surgeon: Durene Romans, MD;  Location: [facility];  Service: Orthopedics;  Laterality: Right;     reports that  she quit smoking about 44 years ago. Her smoking use included cigarettes. She started smoking about 54 years ago. She has a 10 pack-year smoking history. She has never used smokeless tobacco. She reports that she does not drink alcohol and does not use drugs.  Allergies  Allergen Reactions   Sulfa Antibiotics Rash   Clindamycin/Lincomycin    Keflex [Cephalexin]    Niacin And Related     Extreme flushing, heart racing   Other     "Clindamycin"-Antibiotic caused thrush.    Septra  [Sulfamethoxazole-Trimethoprim]     Family History  Problem Relation Age of Onset   Allergic rhinitis Daughter    Hypertension Mother    Diabetes Mother    Kidney disease Mother     Prior to Admission medications   Medication Sig Start Date End Date Taking? Authorizing Provider  alendronate (FOSAMAX) 70 MG tablet Take 70 mg by mouth once a week. 06/06/20   [provider]  folic acid-pyridoxine-cyancobalamin (FOLTX) 2.5-25-2 MG TABS tablet Take 1 tablet by mouth daily. 09/22/22   [place], Eric J, DO    Physical Exam: Vitals:   11/22/22 1830 11/22/22 2120 11/22/22 2130 11/22/22 2215  BP: (!) 102/54  125/68 118/71  Pulse: (!) 106  100 99  Resp: 18  16 16   Temp:  98.3 F (36.8 C)    TempSrc:  Axillary    SpO2: 100%  93% 98%    Physical Exam Vitals reviewed.  Constitutional:      General: She is not in acute distress. HENT:     Head: Normocephalic and atraumatic.  Eyes:     Extraocular Movements: Extraocular movements intact.  Cardiovascular:     Rate and Rhythm: Normal rate and regular rhythm.     Pulses: Normal pulses.  Pulmonary:     Effort: Pulmonary effort is normal. No respiratory distress.     Breath sounds: Normal breath sounds. No wheezing or rales.  Abdominal:     General: Bowel sounds are normal. There is no distension.     Palpations: Abdomen is soft.     Tenderness: There is no abdominal tenderness.  Musculoskeletal:     Cervical back: Normal range of motion.     Right lower leg: Edema present.     Left lower leg: No edema.     Comments: Unilateral right lower leg edema.  Right lower extremity shortened and externally rotated.  Skin:    General: Skin is warm and dry.  Neurological:     General: No focal deficit present.     Mental Status: She is alert.     Comments: Somnolent but arousable     Labs on Admission: I have personally reviewed following labs and imaging studies  CBC: Recent Labs  Lab 11/22/22 1949  WBC 9.5  NEUTROABS 7.5   HGB 11.3*  HCT 35.7*  MCV 83.8  PLT 158   Basic Metabolic Panel: Recent Labs  Lab 11/22/22 1949  NA 134*  K 3.7  CL 100  CO2 24  GLUCOSE 187*  BUN 19  CREATININE 0.78  CALCIUM 9.0   GFR: CrCl cannot be calculated (Unknown ideal weight.). Liver Function Tests: No results for input(s): "AST", "ALT", "ALKPHOS", "BILITOT", "PROT", "ALBUMIN" in the last 168 hours. No results for input(s): "LIPASE", "AMYLASE" in the last 168 hours. No results for input(s): "AMMONIA" in the last 168 hours. Coagulation Profile: No results for input(s): "INR", "PROTIME" in the last 168 hours. Cardiac Enzymes: No results for input(s): "CKTOTAL", "CKMB", "  CKMBINDEX", "TROPONINI" in the last 168 hours. BNP (last 3 results) No results for input(s): "PROBNP" in the last 8760 hours. HbA1C: No results for input(s): "HGBA1C" in the last 72 hours. CBG: No results for input(s): "GLUCAP" in the last 168 hours. Lipid Profile: No results for input(s): "CHOL", "HDL", "LDLCALC", "TRIG", "CHOLHDL", "LDLDIRECT" in the last 72 hours. Thyroid Function Tests: No results for input(s): "TSH", "T4TOTAL", "FREET4", "T3FREE", "THYROIDAB" in the last 72 hours. Anemia Panel: No results for input(s): "VITAMINB12", "FOLATE", "FERRITIN", "TIBC", "IRON", "RETICCTPCT" in the last 72 hours. Urine analysis:    Component Value Date/Time   COLORURINE YELLOW 09/16/2022 2152   APPEARANCEUR CLEAR 09/16/2022 2152   LABSPEC 1.009 09/16/2022 2152   PHURINE 5.5 09/16/2022 2152   GLUCOSEU NEGATIVE 09/16/2022 2152   HGBUR NEGATIVE 09/16/2022 2152   BILIRUBINUR NEGATIVE 09/16/2022 2152   KETONESUR NEGATIVE 09/16/2022 2152   PROTEINUR NEGATIVE 09/16/2022 2152   UROBILINOGEN 0.2 07/02/2014 1145   NITRITE NEGATIVE 09/16/2022 2152   LEUKOCYTESUR NEGATIVE 09/16/2022 2152    Radiological Exams on Admission: DG Pelvis 1-2 Views  Result Date: 11/22/2022 CLINICAL DATA:  Fall yesterday.  Persistent right hip pain. EXAM: PELVIS - 1-2  VIEW; RIGHT FEMUR 2 VIEWS COMPARISON:  AP pelvis 03/17/2021 FINDINGS: There is diffuse decreased bone mineralization. There is an acute proximal femoral intertrochanteric fracture with up to approximately 5 mm fracture line diastasis but otherwise no significant displacement. Status post total right knee arthroplasty without evidence of hardware failure. No right knee joint effusion. Surgical clips overlie the pelvis. Mild-to-moderate atherosclerotic calcifications. IMPRESSION: Acute proximal femoral intertrochanteric fracture. Electronically Signed   By: Neita Garnet M.D.   On: 11/22/2022 20:04   DG Femur Min 2 Views Right  Result Date: 11/22/2022 CLINICAL DATA:  Fall yesterday.  Persistent right hip pain. EXAM: PELVIS - 1-2 VIEW; RIGHT FEMUR 2 VIEWS COMPARISON:  AP pelvis 03/17/2021 FINDINGS: There is diffuse decreased bone mineralization. There is an acute proximal femoral intertrochanteric fracture with up to approximately 5 mm fracture line diastasis but otherwise no significant displacement. Status post total right knee arthroplasty without evidence of hardware failure. No right knee joint effusion. Surgical clips overlie the pelvis. Mild-to-moderate atherosclerotic calcifications. IMPRESSION: Acute proximal femoral intertrochanteric fracture. Electronically Signed   By: Neita Garnet M.D.   On: 11/22/2022 20:04   DG Chest 1 View  Result Date: 11/22/2022 CLINICAL DATA:  Fall yesterday. EXAM: CHEST  1 VIEW COMPARISON:  AP chest 09/16/2022, CT chest 05/26/2022, CT chest 07/01/2020, chest two views 08/07/2017 FINDINGS: The patient is rotated to the left. A skin fold overlies the superior right lung and inferior left lung. Heart size is not well evaluated given projection. Mediastinal contours are grossly unremarkable but also limited by projection. Right axillary surgical clips are again seen. Long-term stable right middle lobe nodule as seen on prior 05/26/2022 and 07/01/2020 CTs. Left breast prosthesis  again noted. Moderately decreased lung volumes with bibasilar bronchovascular crowding. No definitive focal airspace opacity. No pleural effusion or pneumothorax. No acute skeletal abnormality. Surgical clips again overlie the inferior right breast. IMPRESSION: 1. No acute cardiopulmonary process. 2. Chronic findings as above. Electronically Signed   By: Neita Garnet M.D.   On: 11/22/2022 20:02   CT Head Wo Contrast  Result Date: 11/22/2022 CLINICAL DATA:  82 year old female with history of head trauma from a fall. EXAM: CT HEAD WITHOUT CONTRAST CT CERVICAL SPINE WITHOUT CONTRAST TECHNIQUE: Multidetector CT imaging of the head and cervical spine was performed following the standard protocol  without intravenous contrast. Multiplanar CT image reconstructions of the cervical spine were also generated. RADIATION DOSE REDUCTION: This exam was performed according to the departmental dose-optimization program which includes automated exposure control, adjustment of the mA and/or kV according to patient size and/or use of iterative reconstruction technique. COMPARISON:  No priors. FINDINGS: CT HEAD FINDINGS Brain: Mild cerebral atrophy. Patchy and confluent areas of decreased attenuation are noted throughout the deep and periventricular white matter of the cerebral hemispheres bilaterally, compatible with chronic microvascular ischemic disease. Physiologic calcifications in the basal ganglia bilaterally are incidentally noted. No evidence of acute infarction, hemorrhage, hydrocephalus, extra-axial collection or mass lesion/mass effect. Vascular: No hyperdense vessel or unexpected calcification. Skull: Normal. Negative for fracture or focal lesion. Sinuses/Orbits: No acute finding. Other: None. CT CERVICAL SPINE FINDINGS Alignment: Normal. Skull base and vertebrae: No acute fracture. No primary bone lesion or focal pathologic process. Soft tissues and spinal canal: No prevertebral fluid or swelling. No visible canal  hematoma. Disc levels: Multilevel degenerative disc disease, most evident at C5-C6. Mild to moderate multilevel facet arthropathy bilaterally. Upper chest: Unremarkable. Other: None. IMPRESSION: 1. No evidence of significant acute traumatic injury to the skull, brain or cervical spine. 2. Mild cerebral atrophy with chronic microvascular ischemic changes in the cerebral white matter, as above. 3. Mild multilevel degenerative disc disease and cervical spondylosis, as above. Electronically Signed   By: Trudie Reed M.D.   On: 11/22/2022 05:52   CT Cervical Spine Wo Contrast  Result Date: 11/22/2022 CLINICAL DATA:  82 year old female with history of head trauma from a fall. EXAM: CT HEAD WITHOUT CONTRAST CT CERVICAL SPINE WITHOUT CONTRAST TECHNIQUE: Multidetector CT imaging of the head and cervical spine was performed following the standard protocol without intravenous contrast. Multiplanar CT image reconstructions of the cervical spine were also generated. RADIATION DOSE REDUCTION: This exam was performed according to the departmental dose-optimization program which includes automated exposure control, adjustment of the mA and/or kV according to patient size and/or use of iterative reconstruction technique. COMPARISON:  No priors. FINDINGS: CT HEAD FINDINGS Brain: Mild cerebral atrophy. Patchy and confluent areas of decreased attenuation are noted throughout the deep and periventricular white matter of the cerebral hemispheres bilaterally, compatible with chronic microvascular ischemic disease. Physiologic calcifications in the basal ganglia bilaterally are incidentally noted. No evidence of acute infarction, hemorrhage, hydrocephalus, extra-axial collection or mass lesion/mass effect. Vascular: No hyperdense vessel or unexpected calcification. Skull: Normal. Negative for fracture or focal lesion. Sinuses/Orbits: No acute finding. Other: None. CT CERVICAL SPINE FINDINGS Alignment: Normal. Skull base and  vertebrae: No acute fracture. No primary bone lesion or focal pathologic process. Soft tissues and spinal canal: No prevertebral fluid or swelling. No visible canal hematoma. Disc levels: Multilevel degenerative disc disease, most evident at C5-C6. Mild to moderate multilevel facet arthropathy bilaterally. Upper chest: Unremarkable. Other: None. IMPRESSION: 1. No evidence of significant acute traumatic injury to the skull, brain or cervical spine. 2. Mild cerebral atrophy with chronic microvascular ischemic changes in the cerebral white matter, as above. 3. Mild multilevel degenerative disc disease and cervical spondylosis, as above. Electronically Signed   By: Trudie Reed M.D.   On: 11/22/2022 05:52   DG Knee Complete 4 Views Right  Result Date: 11/22/2022 CLINICAL DATA:  Fall EXAM: RIGHT KNEE - COMPLETE 4+ VIEW COMPARISON:  None Available. FINDINGS: Changes of right knee replacement. No hardware complicating feature. No acute bony abnormality. Specifically, no fracture, subluxation, or dislocation. No joint effusion. IMPRESSION: Right knee replacement.  No acute bony  abnormality. Electronically Signed   By: Charlett Nose M.D.   On: 11/22/2022 03:39    EKG: Independently reviewed. ***  Assessment and Plan  DVT prophylaxis: {Blank single:19197::"Lovenox","SQ Heparin","IV heparin gtt","Xarelto","Eliquis","Coumadin","SCDs","***"} Code Status: DNR/DNI (discussed with patient's daughter Kennith Gain) Family Communication: Daughter and son-in-law at bedside. Consults called: ***  Level of care: {Blank single:19197::"Med-Surg","Telemetry bed","Progressive Care Unit","Step Down Unit"} Admission status: *** Time Spent: 75+ minutes***  John Giovanni MD [facility]  If 7PM-7AM, please contact night-coverage [email or web address]  11/22/2022, 10:31 PM

## 2022-11-22 NOTE — ED Provider Notes (Signed)
EMERGENCY DEPARTMENT AT [facility] Provider Note   CSN: [id number] Arrival date & time: 11/22/22  1324     History  Chief Complaint  Patient presents with   Fall    - THINNERS , FALL POSSIBLY FROM STANDING POSITION, PT DOESN'T REMEMBER, PT WALKS WITH CANE, PT DENIES LOC, BUT ENDORSES HITTING HEAD, PT WITH SWELLING TO R. KNEE AT BASELINE , NO OTHER DEFORMITIES NOTED PER EMS     Brenda Pham is a 82 y.o. female.  The history is provided by the patient and the EMS personnel.  Fall  She has history of diabetes, hyperlipidemia and comes in following a fall at home.  She states she did hit her head but denies loss of consciousness.  She thinks she just lost her balance.  Denies other injury.  EMS reported patient is taking blood thinners, but patient denies this.   Home Medications Prior to Admission medications   Medication Sig Start Date End Date Taking? Authorizing Provider  alendronate (FOSAMAX) 70 MG tablet Take 70 mg by mouth once a week. 06/06/20   [provider]  folic acid-pyridoxine-cyancobalamin (FOLTX) 2.5-25-2 MG TABS tablet Take 1 tablet by mouth daily. 09/22/22   [place], Alvira Philips, DO      Allergies    Sulfa antibiotics, Keflex [cephalexin], Niacin and related, Other, and Septra [sulfamethoxazole-trimethoprim]    Review of Systems   Review of Systems  All other systems reviewed and are negative.   Physical Exam Updated Vital Signs BP 118/73 (BP Location: Left Arm)   Pulse (!) 102   Temp 97.9 F (36.6 C) (Oral)   Resp 18   SpO2 100%  Physical Exam Vitals and nursing note reviewed.   82 year old female, resting comfortably and in no acute distress. Vital signs are significant for blood Elevated heart rate. Oxygen saturation is 100%, which is normal. Head is normocephalic and atraumatic. PERRLA, EOMI. Oropharynx is clear. Neck is immobilized in the stiff cervical collar and is nontender. Back is nontender and there is no  CVA tenderness. Lungs are clear without rales, wheezes, or rhonchi. Chest is nontender. Heart has regular rate and rhythm without murmur. Abdomen is soft, flat, nontender. Extremities: There is swelling of the right knee with 2+ pitting edema of the right lower leg, pain on passive range of motion of the right knee.  Patient is unsure if she was having prior problems with her knee or not.  Full range of motion present all other joints without pain. Skin is warm and dry without rash. Neurologic: Awake and alert, oriented x 3, cranial nerves are intact.  ED Results / Procedures / Treatments    Radiology CT Head Wo Contrast  Result Date: 11/22/2022 CLINICAL DATA:  82 year old female with history of head trauma from a fall. EXAM: CT HEAD WITHOUT CONTRAST CT CERVICAL SPINE WITHOUT CONTRAST TECHNIQUE: Multidetector CT imaging of the head and cervical spine was performed following the standard protocol without intravenous contrast. Multiplanar CT image reconstructions of the cervical spine were also generated. RADIATION DOSE REDUCTION: This exam was performed according to the departmental dose-optimization program which includes automated exposure control, adjustment of the mA and/or kV according to patient size and/or use of iterative reconstruction technique. COMPARISON:  No priors. FINDINGS: CT HEAD FINDINGS Brain: Mild cerebral atrophy. Patchy and confluent areas of decreased attenuation are noted throughout the deep and periventricular white matter of the cerebral hemispheres bilaterally, compatible with chronic microvascular ischemic disease. Physiologic calcifications in the basal  ganglia bilaterally are incidentally noted. No evidence of acute infarction, hemorrhage, hydrocephalus, extra-axial collection or mass lesion/mass effect. Vascular: No hyperdense vessel or unexpected calcification. Skull: Normal. Negative for fracture or focal lesion. Sinuses/Orbits: No acute finding. Other: None. CT CERVICAL  SPINE FINDINGS Alignment: Normal. Skull base and vertebrae: No acute fracture. No primary bone lesion or focal pathologic process. Soft tissues and spinal canal: No prevertebral fluid or swelling. No visible canal hematoma. Disc levels: Multilevel degenerative disc disease, most evident at C5-C6. Mild to moderate multilevel facet arthropathy bilaterally. Upper chest: Unremarkable. Other: None. IMPRESSION: 1. No evidence of significant acute traumatic injury to the skull, brain or cervical spine. 2. Mild cerebral atrophy with chronic microvascular ischemic changes in the cerebral white matter, as above. 3. Mild multilevel degenerative disc disease and cervical spondylosis, as above. Electronically Signed   By: Trudie Reed M.D.   On: 11/22/2022 05:52   CT Cervical Spine Wo Contrast  Result Date: 11/22/2022 CLINICAL DATA:  82 year old female with history of head trauma from a fall. EXAM: CT HEAD WITHOUT CONTRAST CT CERVICAL SPINE WITHOUT CONTRAST TECHNIQUE: Multidetector CT imaging of the head and cervical spine was performed following the standard protocol without intravenous contrast. Multiplanar CT image reconstructions of the cervical spine were also generated. RADIATION DOSE REDUCTION: This exam was performed according to the departmental dose-optimization program which includes automated exposure control, adjustment of the mA and/or kV according to patient size and/or use of iterative reconstruction technique. COMPARISON:  No priors. FINDINGS: CT HEAD FINDINGS Brain: Mild cerebral atrophy. Patchy and confluent areas of decreased attenuation are noted throughout the deep and periventricular white matter of the cerebral hemispheres bilaterally, compatible with chronic microvascular ischemic disease. Physiologic calcifications in the basal ganglia bilaterally are incidentally noted. No evidence of acute infarction, hemorrhage, hydrocephalus, extra-axial collection or mass lesion/mass effect. Vascular: No  hyperdense vessel or unexpected calcification. Skull: Normal. Negative for fracture or focal lesion. Sinuses/Orbits: No acute finding. Other: None. CT CERVICAL SPINE FINDINGS Alignment: Normal. Skull base and vertebrae: No acute fracture. No primary bone lesion or focal pathologic process. Soft tissues and spinal canal: No prevertebral fluid or swelling. No visible canal hematoma. Disc levels: Multilevel degenerative disc disease, most evident at C5-C6. Mild to moderate multilevel facet arthropathy bilaterally. Upper chest: Unremarkable. Other: None. IMPRESSION: 1. No evidence of significant acute traumatic injury to the skull, brain or cervical spine. 2. Mild cerebral atrophy with chronic microvascular ischemic changes in the cerebral white matter, as above. 3. Mild multilevel degenerative disc disease and cervical spondylosis, as above. Electronically Signed   By: Trudie Reed M.D.   On: 11/22/2022 05:52   DG Knee Complete 4 Views Right  Result Date: 11/22/2022 CLINICAL DATA:  Fall EXAM: RIGHT KNEE - COMPLETE 4+ VIEW COMPARISON:  None Available. FINDINGS: Changes of right knee replacement. No hardware complicating feature. No acute bony abnormality. Specifically, no fracture, subluxation, or dislocation. No joint effusion. IMPRESSION: Right knee replacement.  No acute bony abnormality. Electronically Signed   By: Charlett Nose M.D.   On: 11/22/2022 03:39    Procedures Procedures    Medications Ordered in ED Medications - No data to display  ED Course/ Medical Decision Making/ A&P                             Medical Decision Making Amount and/or Complexity of Data Reviewed Radiology: ordered.   Fall with minor head injury.  EMS reported patient was on  anticoagulants, but her medication list does not include any anticoagulants.  On review of old records, she had been hospitalized on 09/16/2022 through 09/21/2022 and discharge medication list did not include any anticoagulants or antiplatelet  agents.  I have ordered CT of head and cervical spine and plain x-rays of her right knee.  CT scans show no evidence of acute injury, knee x-ray shows presence of total knee arthroplasty and no acute injury.  I have independently viewed the images and agree with the radiologist's interpretation.  I am discharging her with instructions to follow-up with primary care provider.  Final Clinical Impression(s) / ED Diagnoses Final diagnoses:  Fall at home, initial encounter    Rx / DC Orders ED Discharge Orders     None         Dione Booze, MD 11/22/22 [phone]

## 2022-11-22 NOTE — ED Notes (Signed)
XR ABS

## 2022-11-22 NOTE — ED Notes (Signed)
Tried to call report to receiving facility. No answer.

## 2022-11-22 NOTE — Discharge Instructions (Signed)
There was no evidence of any significant injury from this fall.  However, if you develop sore areas, you may apply ice for 30 minutes at a time, 4 times a day.  You may take acetaminophen and/or ibuprofen as needed for pain.

## 2022-11-22 NOTE — ED Triage Notes (Signed)
The patient presents from [facility] with a request from the facility to take hip xrays. She had a fall yesterday and was seen at the hospital, but still complains of pain today and has not moved from her wheelchair.    Hx: Dementia @ baseline   EMS vitals: 112/62 BP 108 HR 18 RR 98% SPO2 on room air

## 2022-11-23 ENCOUNTER — Inpatient Hospital Stay (HOSPITAL_COMMUNITY): Payer: Medicare Other

## 2022-11-23 DIAGNOSIS — E119 Type 2 diabetes mellitus without complications: Secondary | ICD-10-CM

## 2022-11-23 DIAGNOSIS — R600 Localized edema: Secondary | ICD-10-CM

## 2022-11-23 DIAGNOSIS — M7989 Other specified soft tissue disorders: Secondary | ICD-10-CM | POA: Diagnosis not present

## 2022-11-23 DIAGNOSIS — D649 Anemia, unspecified: Secondary | ICD-10-CM

## 2022-11-23 DIAGNOSIS — S72001D Fracture of unspecified part of neck of right femur, subsequent encounter for closed fracture with routine healing: Secondary | ICD-10-CM

## 2022-11-23 LAB — BASIC METABOLIC PANEL
Anion gap: 9 (ref 5–15)
BUN: 17 mg/dL (ref 8–23)
CO2: 26 mmol/L (ref 22–32)
Calcium: 8.7 mg/dL — ABNORMAL LOW (ref 8.9–10.3)
Chloride: 101 mmol/L (ref 98–111)
Creatinine, Ser: 0.78 mg/dL (ref 0.44–1.00)
GFR, Estimated: >60 mL/min (ref >60)
Glucose, Bld: 150 mg/dL — ABNORMAL HIGH (ref 70–99)
Potassium: 3.9 mmol/L (ref 3.5–5.1)
Sodium: 136 mmol/L (ref 135–145)

## 2022-11-23 LAB — CBC
HCT: 35.8 % — ABNORMAL LOW (ref 36.0–46.0)
Hemoglobin: 11 g/dL — ABNORMAL LOW (ref 12.0–15.0)
MCH: 26.3 pg (ref 26.0–34.0)
MCHC: 30.7 g/dL (ref 30.0–36.0)
MCV: 85.4 fL (ref 80.0–100.0)
Platelets: 149 10*3/uL — ABNORMAL LOW (ref 150–400)
RBC: 4.19 MIL/uL (ref 3.87–5.11)
RDW: 15 % (ref 11.5–15.5)
WBC: 7.5 10*3/uL (ref 4.0–10.5)
nRBC: 0 % (ref 0.0–0.2)

## 2022-11-23 LAB — TYPE AND SCREEN
ABO/RH(D): O POS
Antibody Screen: NEGATIVE

## 2022-11-23 LAB — GLUCOSE, CAPILLARY
Glucose-Capillary: 132 mg/dL — ABNORMAL HIGH (ref 70–99)
Glucose-Capillary: 140 mg/dL — ABNORMAL HIGH (ref 70–99)
Glucose-Capillary: 140 mg/dL — ABNORMAL HIGH (ref 70–99)
Glucose-Capillary: 137 mg/dL — ABNORMAL HIGH (ref 70–99)
Glucose-Capillary: 159 mg/dL — ABNORMAL HIGH (ref 70–99)

## 2022-11-23 LAB — SURGICAL PCR SCREEN
MRSA, PCR: NEGATIVE
Staphylococcus aureus: NEGATIVE

## 2022-11-23 MED ORDER — INSULIN ASPART 100 UNIT/ML IJ SOLN
0.0000 [IU] | INTRAMUSCULAR | Status: DC
  Administered 2022-11-23: 2 [IU] via SUBCUTANEOUS
  Administered 2022-11-23 – 2022-11-24 (×5): 1 [IU] via SUBCUTANEOUS
  Administered 2022-11-24: 2 [IU] via SUBCUTANEOUS
  Administered 2022-11-24 – 2022-11-25 (×4): 1 [IU] via SUBCUTANEOUS
  Administered 2022-11-25 (×2): 2 [IU] via SUBCUTANEOUS
  Administered 2022-11-26: 1 [IU] via SUBCUTANEOUS
  Administered 2022-11-26 (×3): 2 [IU] via SUBCUTANEOUS
  Administered 2022-11-26 – 2022-11-27 (×5): 1 [IU] via SUBCUTANEOUS
  Administered 2022-11-28: 2 [IU] via SUBCUTANEOUS
  Administered 2022-11-28: 1 [IU] via SUBCUTANEOUS

## 2022-11-23 MED ORDER — PROSOURCE PLUS PO LIQD
30.0000 mL | Freq: Two times a day (BID) | ORAL | Status: DC
  Administered 2022-11-23 – 2022-11-28 (×7): 30 mL via ORAL
  Filled 2022-11-23 (×8): qty 30

## 2022-11-23 NOTE — Consult Note (Signed)
ORTHOPAEDIC CONSULTATION  REQUESTING PHYSICIAN: Kathlen Mody, MD  PCP:  Eloisa Northern, MD  Chief Complaint: Fall right hip pain.   HPI: Brenda Pham is a 82 y.o. female who complains of  medical history significant of diet-controlled type 2 diabetes, hyperlipidemia, history of breast cancer, osteoporosis, B12 deficiency, dementia.  She was evaluated in the ED earlier yesterday after a mechanical fall.  CT head/C-spine and x-ray of right knee were negative for acute findings.  She was discharged back to her facility.  Patient returns to the ED yesterday evening complaining of right hip pain.  X-ray of right femur showing acute proximal femoral intertrochanteric fracture.   Daughter at bedside. Daughter states patient has advanced dementia and is currently at the memory care unit of [facility] nursing home.  Family was told that she had a fall sometime late last night/early yesterday morning and was evaluated in the emergency room.  Since after the fall, patient has been complaining of pain in her right hip and has not been able to bear weight on this leg.  She does not take any blood thinners.  Daughter states patient has chronic intermittent lower extremity edema for which she has been treated with Lasix but for the past week her right leg is more swollen.  She is not aware of patient having any history of blood clots in the past.   She normally ambulates with a walker.   Past Medical History:  Diagnosis Date   Arthritis    osteoarthritis -knees, hands   Cancer (HCC)    '80-right breast, surgery and radiation.   Diabetes mellitus without complication (HCC)    recently dx. no oral meds or insulin.   Hyperlipidemia    PONV (postoperative nausea and vomiting)    Past Surgical History:  Procedure Laterality Date   AMPUTATION Left 11/18/2021   Procedure: LEFT 5TH AMPUTATION RAY;  Surgeon: Toni Arthurs, MD;  Location: [facility];  Service: Orthopedics;  Laterality:  Left;  local by surgeon 30 okay per Tammy   APPENDECTOMY     BREAST SURGERY     Rt. breast modified mastectomy/(reconstuction)-last '03 tranflap(tissue from abdomen); and left subcutaneoues mastectomy and implant left breast-last implant '02.   CATARACT EXTRACTION, BILATERAL     last 06-11-14 left   DILATION AND CURETTAGE OF UTERUS     FINGER SURGERY Right    right ring finger-excsion cyst and nodules.   FOOT SURGERY Left    '10- 2nd toe    KNEE ARTHROSCOPY Right    KNEE ARTHROSCOPY Left    scope with hammer toe, '06- scope for torn cartilage   TOTAL KNEE ARTHROPLASTY Right 07/07/2014   Procedure: RIGHT TOTAL KNEE ARTHROPLASTY;  Surgeon: Durene Romans, MD;  Location: [facility];  Service: Orthopedics;  Laterality: Right;   Social History   Socioeconomic History   Marital status: Widowed    Spouse name: Not on file   Number of children: Not on file   Years of education: Not on file   Highest education level: Not on file  Occupational History   Not on file  Tobacco Use   Smoking status: Former    Current packs/day: 0.00    Average packs/day: 1 pack/day for 10.0 years (10.0 ttl pk-yrs)    Types: Cigarettes    Start date: 07/01/1968    Quit date: 07/02/1978    Years since quitting: 44.4   Smokeless tobacco: Never  Vaping Use   Vaping status: Never Used  Substance  and Sexual Activity   Alcohol use: No   Drug use: No   Sexual activity: Not Currently  Other Topics Concern   Not on file  Social History Narrative   Not on file   Social Determinants of Health   Financial Resource Strain: Not on file  Food Insecurity: Not on file  Transportation Needs: Not on file  Physical Activity: Not on file  Stress: Not on file  Social Connections: Not on file   Family History  Problem Relation Age of Onset   Allergic rhinitis Daughter    Hypertension Mother    Diabetes Mother    Kidney disease Mother    Allergies  Allergen Reactions   Sulfa Antibiotics Rash   Keflex [Cephalexin]     Niacin And Related     Extreme flushing, heart racing   Other     "Clindamycin"-Antibiotic caused thrush.    Septra [Sulfamethoxazole-Trimethoprim]    Prior to Admission medications   Medication Sig Start Date End Date Taking? Authorizing Provider  furosemide (LASIX) 20 MG tablet Take 40 mg by mouth in the morning.   Yes [provider]  lactulose (CHRONULAC) 10 GM/15ML solution Take 10 g by mouth as directed. Give 15 mls BID Every Other Day   Yes [provider]  LORazepam (ATIVAN) 0.5 MG tablet Take 0.5 mg by mouth every 8 (eight) hours as needed for anxiety.   Yes [provider]  melatonin 3 MG TABS tablet Take 3 mg by mouth at bedtime.   Yes [provider]  potassium chloride (KLOR-CON) 10 MEQ tablet Take 10 mEq by mouth in the morning.   Yes [provider]  thiamine (VITAMIN B1) 100 MG tablet Take 100 mg by mouth daily.   Yes [provider]  folic acid-pyridoxine-cyancobalamin (FOLTX) 2.5-25-2 MG TABS tablet Take 1 tablet by mouth daily. Patient not taking: Reported on 11/22/2022 09/22/22   [place], Eric J, [place]   DG Pelvis 1-2 Views  Result Date: 11/22/2022 CLINICAL DATA:  Fall yesterday.  Persistent right hip pain. EXAM: PELVIS - 1-2 VIEW; RIGHT FEMUR 2 VIEWS COMPARISON:  AP pelvis 03/17/2021 FINDINGS: There is diffuse decreased bone mineralization. There is an acute proximal femoral intertrochanteric fracture with up to approximately 5 mm fracture line diastasis but otherwise no significant displacement. Status post total right knee arthroplasty without evidence of hardware failure. No right knee joint effusion. Surgical clips overlie the pelvis. Mild-to-moderate atherosclerotic calcifications. IMPRESSION: Acute proximal femoral intertrochanteric fracture. Electronically Signed   By: Neita Garnet M.D.   On: 11/22/2022 20:04   DG Femur Min 2 Views Right  Result Date: 11/22/2022 CLINICAL DATA:  Fall yesterday.  Persistent right hip  pain. EXAM: PELVIS - 1-2 VIEW; RIGHT FEMUR 2 VIEWS COMPARISON:  AP pelvis 03/17/2021 FINDINGS: There is diffuse decreased bone mineralization. There is an acute proximal femoral intertrochanteric fracture with up to approximately 5 mm fracture line diastasis but otherwise no significant displacement. Status post total right knee arthroplasty without evidence of hardware failure. No right knee joint effusion. Surgical clips overlie the pelvis. Mild-to-moderate atherosclerotic calcifications. IMPRESSION: Acute proximal femoral intertrochanteric fracture. Electronically Signed   By: Neita Garnet M.D.   On: 11/22/2022 20:04   DG Chest 1 View  Result Date: 11/22/2022 CLINICAL DATA:  Fall yesterday. EXAM: CHEST  1 VIEW COMPARISON:  AP chest 09/16/2022, CT chest 05/26/2022, CT chest 07/01/2020, chest two views 08/07/2017 FINDINGS: The patient is rotated to the left. A skin fold overlies the superior right lung  and inferior left lung. Heart size is not well evaluated given projection. Mediastinal contours are grossly unremarkable but also limited by projection. Right axillary surgical clips are again seen. Long-term stable right middle lobe nodule as seen on prior 05/26/2022 and 07/01/2020 CTs. Left breast prosthesis again noted. Moderately decreased lung volumes with bibasilar bronchovascular crowding. No definitive focal airspace opacity. No pleural effusion or pneumothorax. No acute skeletal abnormality. Surgical clips again overlie the inferior right breast. IMPRESSION: 1. No acute cardiopulmonary process. 2. Chronic findings as above. Electronically Signed   By: Neita Garnet M.D.   On: 11/22/2022 20:02   CT Head Wo Contrast  Result Date: 11/22/2022 CLINICAL DATA:  82 year old female with history of head trauma from a fall. EXAM: CT HEAD WITHOUT CONTRAST CT CERVICAL SPINE WITHOUT CONTRAST TECHNIQUE: Multidetector CT imaging of the head and cervical spine was performed following the standard protocol without  intravenous contrast. Multiplanar CT image reconstructions of the cervical spine were also generated. RADIATION DOSE REDUCTION: This exam was performed according to the departmental dose-optimization program which includes automated exposure control, adjustment of the mA and/or kV according to patient size and/or use of iterative reconstruction technique. COMPARISON:  No priors. FINDINGS: CT HEAD FINDINGS Brain: Mild cerebral atrophy. Patchy and confluent areas of decreased attenuation are noted throughout the deep and periventricular white matter of the cerebral hemispheres bilaterally, compatible with chronic microvascular ischemic disease. Physiologic calcifications in the basal ganglia bilaterally are incidentally noted. No evidence of acute infarction, hemorrhage, hydrocephalus, extra-axial collection or mass lesion/mass effect. Vascular: No hyperdense vessel or unexpected calcification. Skull: Normal. Negative for fracture or focal lesion. Sinuses/Orbits: No acute finding. Other: None. CT CERVICAL SPINE FINDINGS Alignment: Normal. Skull base and vertebrae: No acute fracture. No primary bone lesion or focal pathologic process. Soft tissues and spinal canal: No prevertebral fluid or swelling. No visible canal hematoma. Disc levels: Multilevel degenerative disc disease, most evident at C5-C6. Mild to moderate multilevel facet arthropathy bilaterally. Upper chest: Unremarkable. Other: None. IMPRESSION: 1. No evidence of significant acute traumatic injury to the skull, brain or cervical spine. 2. Mild cerebral atrophy with chronic microvascular ischemic changes in the cerebral white matter, as above. 3. Mild multilevel degenerative disc disease and cervical spondylosis, as above. Electronically Signed   By: Trudie Reed M.D.   On: 11/22/2022 05:52   CT Cervical Spine Wo Contrast  Result Date: 11/22/2022 CLINICAL DATA:  82 year old female with history of head trauma from a fall. EXAM: CT HEAD WITHOUT  CONTRAST CT CERVICAL SPINE WITHOUT CONTRAST TECHNIQUE: Multidetector CT imaging of the head and cervical spine was performed following the standard protocol without intravenous contrast. Multiplanar CT image reconstructions of the cervical spine were also generated. RADIATION DOSE REDUCTION: This exam was performed according to the departmental dose-optimization program which includes automated exposure control, adjustment of the mA and/or kV according to patient size and/or use of iterative reconstruction technique. COMPARISON:  No priors. FINDINGS: CT HEAD FINDINGS Brain: Mild cerebral atrophy. Patchy and confluent areas of decreased attenuation are noted throughout the deep and periventricular white matter of the cerebral hemispheres bilaterally, compatible with chronic microvascular ischemic disease. Physiologic calcifications in the basal ganglia bilaterally are incidentally noted. No evidence of acute infarction, hemorrhage, hydrocephalus, extra-axial collection or mass lesion/mass effect. Vascular: No hyperdense vessel or unexpected calcification. Skull: Normal. Negative for fracture or focal lesion. Sinuses/Orbits: No acute finding. Other: None. CT CERVICAL SPINE FINDINGS Alignment: Normal. Skull base and vertebrae: No acute fracture. No primary bone lesion or focal pathologic  process. Soft tissues and spinal canal: No prevertebral fluid or swelling. No visible canal hematoma. Disc levels: Multilevel degenerative disc disease, most evident at C5-C6. Mild to moderate multilevel facet arthropathy bilaterally. Upper chest: Unremarkable. Other: None. IMPRESSION: 1. No evidence of significant acute traumatic injury to the skull, brain or cervical spine. 2. Mild cerebral atrophy with chronic microvascular ischemic changes in the cerebral white matter, as above. 3. Mild multilevel degenerative disc disease and cervical spondylosis, as above. Electronically Signed   By: Trudie Reed M.D.   On: 11/22/2022 05:52    DG Knee Complete 4 Views Right  Result Date: 11/22/2022 CLINICAL DATA:  Fall EXAM: RIGHT KNEE - COMPLETE 4+ VIEW COMPARISON:  None Available. FINDINGS: Changes of right knee replacement. No hardware complicating feature. No acute bony abnormality. Specifically, no fracture, subluxation, or dislocation. No joint effusion. IMPRESSION: Right knee replacement.  No acute bony abnormality. Electronically Signed   By: Charlett Nose M.D.   On: 11/22/2022 03:39    Positive ROS: All other systems have been reviewed and were otherwise negative with the exception of those mentioned in the HPI and as above.  Physical Exam: General: Patient woken up for exam. She is oriented to name only.  Cardiovascular: bilateral LE edema at baseline.  Respiratory: No cyanosis, no use of accessory musculature GI: No organomegaly, abdomen is soft and non-tender Skin: No lesions in the area of chief complaint Neurologic: Sensation intact distally Psychiatric: Patient is competent for consent with normal mood and affect Lymphatic: No axillary or cervical lymphadenopathy.  MUSCULOSKELETAL: Examination of the right hip reveals no skin wounds or lesions. Pain with palpation over the trochanteric region and any movement of RLE. External rotation and shortening noted.   Sensory and motor function intact in LE bilaterally, limited due to mentation. Patient states she has sensation in RLE. Plantar flexion, dorsiflexion, and EHL intact. Distal pedal pulses 2+ bilaterally. Capillary refill < 2 seconds.   Assessment: Acute right intertrochanteric femur fracture.  Dementia T2DM.   Plan: Patient has dementia she is oriented only to name not DOB.  I discussed the findings with the patients daughter, Alvis Lemmings. Patient has an unstable right intertrochanteric femur fracture. She will require surgical treatment for pain control and allow immediate mobilization out of bed. TR[facility] has already seen patient for admission and perioperative  medical optimization, appreciate their input. Plan for right intramedullary fixation potentially today pending OR availability. Hold chemical DVT ppx. Patient has been NPO after midnight, continue. All question solicited and answered.     Clois Dupes, PA-C    11/23/2022 7:38 AM

## 2022-11-23 NOTE — TOC Initial Note (Signed)
Transition of Care [facility]) - Initial/Assessment Note    Patient Details  Name: Brenda Pham MRN: [id number] Date of Birth: 02-07-1941  Transition of Care [facility]) CM/SW Contact:    Amada Jupiter, LCSW Phone Number: 11/23/2022, 1:38 PM  Clinical Narrative:                  Met with pt and daughter today to review dc planning needs.  Daughter confirms that pt was discharged in May 2024 to the SNF level of [facility] but had since moved into the Memory care division at [facility].  Daughter does anticipate pt returning to SNF when she is discharged and I have confirmed with Admissions Dir that [facility] is anticipating this to be the plan as well.  Daughter notes pt's surgery is planned for tomorrow morning.  TOC will continue to follow and assist with dc planning moving forward.   Expected Discharge Plan: Skilled Nursing Facility Barriers to Discharge: Continued Medical Work up   Patient Goals and CMS Choice Patient states their goals for this hospitalization and ongoing recovery are:: return to [facility] prior to return to memory care          Expected Discharge Plan and Services In-house Referral: Clinical Social Work   Post Acute Care Choice: Skilled Nursing Facility Living arrangements for the past 2 months: Assisted Living Facility ([facility])                                      Prior Living Arrangements/Services Living arrangements for the past 2 months: Assisted Living Facility ([facility]) Lives with:: Facility Resident Patient language and need for interpreter reviewed:: Yes Do you feel safe going back to the place where you live?: Yes      Need for Family Participation in Patient Care: No (Comment) Care giver support system in place?: Yes (comment)   Criminal Activity/Legal Involvement Pertinent to Current Situation/Hospitalization: No - Comment as needed  Activities of Daily Living      Permission  Sought/Granted Permission sought to share information with : Family Supports, Oceanographer granted to share information with : Yes, Verbal Permission Granted  Share Information with NAME: daughter, Kennith Gain @ [phone]  Permission granted to share info w AGENCY: [facility] admissions        Emotional Assessment Appearance:: Appears stated age Attitude/Demeanor/Rapport: Lethargic   Orientation: : Oriented to Self Alcohol / Substance Use: Not Applicable Psych Involvement: No (comment)  Admission diagnosis:  Hip fracture (HCC) [S72.009A] Closed displaced intertrochanteric fracture of right femur, initial encounter (HCC) [S72.141A] Patient Active Problem List   Diagnosis Date Noted   Edema of right lower extremity 11/23/2022   Normocytic anemia 11/23/2022   Hip fracture (HCC) 11/22/2022   Acute encephalopathy 09/17/2022   Type 2 diabetes mellitus (HCC) 09/17/2022   Neutropenia (HCC) 09/17/2022   Thrombocytopenia (HCC) 09/17/2022   Tailor's bunion of left foot 10/27/2021   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   Abscess of left foot 03/01/2021   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) 12/28/2020   History of adenomatous polyp of colon 08/30/2017   Hyperlipidemia 08/02/2017   Internal hemorrhoids 08/02/2017   Allergic reaction 04/04/2016   Drug allergy 03/28/2016   Overweight (BMI 25.0-29.9) 07/09/2014   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/07/2014   PCP:  Eloisa Northern, MD Pharmacy:  [id number] - [place], [place] - [address] AT [facility] [address] [place] [address] Phone: [phone] Fax: [phone]  [facility] [phone] [facility] [place], [place] - [address] AT [facility] [address] [place] [address] Phone: [phone] Fax: [phone]     Social Determinants of Health (SDOH) Social  History: SDOH Screenings   Tobacco Use: Medium Risk (11/22/2022)   SDOH Interventions:     Readmission Risk Interventions    11/23/2022    1:22 PM 09/20/2022    2:11 PM  Readmission Risk Prevention Plan  Post Dischage Appt Complete Complete  Medication Screening Complete Complete  Transportation Screening Complete Complete

## 2022-11-23 NOTE — Progress Notes (Signed)
Initial Nutrition Assessment  DOCUMENTATION CODES:   Not applicable  INTERVENTION:  - Regular diet. - ProSource Plus po BID, each supplement provides 100 kcal and 15 grams of protein. - Monitor weight trends.   - No weight taken this admit, ordered for weight to be obtained.  NUTRITION DIAGNOSIS:   Increased nutrient needs related to acute illness (femoral intertrochanceric fracture) as evidenced by estimated needs.  GOAL:   Patient will meet greater than or equal to 90% of their needs  MONITOR:   PO intake, Supplement acceptance, Weight trends  REASON FOR ASSESSMENT:   Consult Hip fracture protocol  ASSESSMENT:   82 y.o. female with PMH significant of diet-controlled type 2 diabetes, HLD, history of breast cancer, osteoporosis, B12 deficiency, dementia who presented after mechanical fall and complaints of right hip pain. Admitted after being found to have proximal femoral intertrochanteric fracture.  Patient sleeping at time of visit, noted to have a history of dementia and not be oriented. Daughter at bedside who reported all nutrition history.   UBW reported to be 138# and daughter states patient has lost about 10# over the past year but it has been gradual over time.  Per EMR, patient weighed at 144# on 11/18/21 and then 145# on 07/20/22. No weight history since that time and no weight taken this admission. RD ordered for weight but no weight yet taken/recorded.   Daughter reports patient wasn't eating very well at home several months ago but was taken to nursing home in May and since then has been eating great. Patient loves the food at facility and was eating 100% of all 3 meals a day. Appetite seemed to be good.   Patient initially NPO this AM but advanced to a Regular diet. Provided daughter with menu to help with meal ordering for patient. She feels patient would be agreeable to try nutrition supplements during admission.   Medications reviewed and include:  Insulin  Labs reviewed:  HA1C 6.1   NUTRITION - FOCUSED PHYSICAL EXAM:  Patient sleeping during visit  Diet Order:   Diet Order             Diet regular Room service appropriate? Yes; Fluid consistency: Thin  Diet effective now                   EDUCATION NEEDS:  Education needs have been addressed  Skin:  Skin Assessment: Reviewed RN Assessment  Last BM:  unknown  Height:  Ht Readings from Last 1 Encounters:  07/20/22 5\' 3"  (1.6 m)   Weight:  Wt Readings from Last 1 Encounters:  07/20/22 66.2 kg    BMI:  There is no height or weight on file to calculate BMI.  Estimated Nutritional Needs:  *utilized IBW due to last weight 4 months old Kcal:  1450-1650 kcals Protein:  60-70 grams Fluid:  >/= 1.5L    Shelle Iron RD, LDN For contact information, refer to [facility]

## 2022-11-23 NOTE — Progress Notes (Signed)
RLE venous duplex has been completed.   Results can be found under chart review under CV PROC. 11/23/2022 12:42 PM Adelin Ventrella RVT, RDMS

## 2022-11-23 NOTE — Progress Notes (Signed)
No response from [facility]. Paged again.

## 2022-11-23 NOTE — Progress Notes (Signed)
[facility]                                                                               Brenda Pham, is a 82 y.o. female, DOB - 03-Oct-1940, GLO:[id number] Admit date - 11/22/2022    Outpatient Primary MD for the patient is Eloisa Northern, MD  LOS - 1  days    Brief summary   82 y.o. female with medical history significant of diet-controlled type 2 diabetes, hyperlipidemia, history of breast cancer, osteoporosis, B12 deficiency.  She was evaluated in the ED earlier today after a mechanical fall.  CT head/C-spine and x-ray of right knee were negative for acute findings.  She was discharged back to her facility.  Patient returns to the ED this evening complaining of right hip pain.  X-ray of right femur showing acute proximal femoral intertrochanteric fracture.  Chest x-ray showing no acute cardiopulmonary disease   Assessment & Plan    Assessment and Plan:  Acute proximal femoral intertrochanteric fracture secondary to mechanical fall Orthopedics consulted and plan for surgical repair in the morning Pain control   Unilateral right lower extremity edema of unclear etiology. Doppler ultrasound ordered and negative for DVT.   Mild anemia of chronic disease Continue to monitor    Diet controlled diabetes mellitus Last A1c 6.1 Continue sliding scale insulin.   Hyperlipidemia Continue to monitor.    Estimated body mass index is 25.86 kg/m as calculated from the following:   Height as of 07/20/22: 5\' 3"  (1.6 m).   Weight as of 07/20/22: 66.2 kg.  Code Status: DNR DVT Prophylaxis:     Level of Care: Level of care: Med-Surg Family Communication: Updated patient's family at bedside.   Disposition Plan:     Remains inpatient appropriate:  surgery scheduled for tomorrow.   Procedures:  Surgical repair of the RLL scheduled for tomorrow.   Consultants:   Orthopedics.   Antimicrobials:   Anti-infectives (From admission, onward)    None         Medications  Scheduled Meds:  insulin aspart  0-9 Units Subcutaneous Q4H   Continuous Infusions:  methocarbamol (ROBAXIN) IV     PRN Meds:.methocarbamol (ROBAXIN) IV, morphine injection, naLOXone (NARCAN)  injection    Subjective:   Brenda Pham was seen and examined today.  Wants to get the oxygen off.  Sleepy.  Objective:   Vitals:   11/22/22 2357 11/23/22 0441 11/23/22 0935 11/23/22 1259  BP: (!) 158/77 (!) 100/56 119/61 113/64  Pulse: (!) 107 94 97 (!) 108  Resp: 15 16 16 18   Temp: 98.2 F (36.8 C) 98.4 F (36.9 C) (!) 97.5 F (36.4 C) 98.7 F (37.1 C)  TempSrc: Oral     SpO2: 96% 100% 100% 100%    Intake/Output Summary (Last 24 hours) at 11/23/2022 1354 Last data filed at 11/23/2022 1884 Gross per 24 hour  Intake 980 ml  Output --  Net 980 ml   There were no vitals filed for this visit.   Exam General exam: Appears calm and comfortable  Respiratory system: Clear to auscultation. Respiratory effort normal. Cardiovascular system: S1 & S2 heard, RRR. No JVD, Gastrointestinal system: Abdomen is nondistended,  soft  Central nervous system: Alert and oriented.  Grossly non focal.  Extremities: painful ROM of the RLL. Skin: No rashes,  Psychiatry: MOOD IS appropriate.    Data Reviewed:  I have personally reviewed following labs and imaging studies   CBC Lab Results  Component Value Date   WBC 7.5 11/23/2022   RBC 4.19 11/23/2022   HGB 11.0 (L) 11/23/2022   HCT 35.8 (L) 11/23/2022   MCV 85.4 11/23/2022   MCH 26.3 11/23/2022   PLT 149 (L) 11/23/2022   MCHC 30.7 11/23/2022   RDW 15.0 11/23/2022   LYMPHSABS 1.1 11/22/2022   MONOABS 0.7 11/22/2022   EOSABS 0.1 11/22/2022   BASOSABS 0.1 11/22/2022     Last metabolic panel Lab Results  Component Value Date   NA 136 11/23/2022   K 3.9 11/23/2022   CL 101 11/23/2022   CO2 26 11/23/2022   BUN 17 11/23/2022   CREATININE 0.78 11/23/2022   GLUCOSE 150 (H) 11/23/2022   GFRNONAA >60  11/23/2022   GFRAA >60 06/06/2015   CALCIUM 8.7 (L) 11/23/2022   PHOS 3.6 09/17/2022   PROT 6.4 (L) 09/18/2022   ALBUMIN 3.6 09/18/2022   BILITOT 0.7 09/18/2022   ALKPHOS 55 09/18/2022   AST 39 09/18/2022   ALT 31 09/18/2022   ANIONGAP 9 11/23/2022    CBG (last 3)  Recent Labs    11/23/22 0512 11/23/22 0723 11/23/22 1138  GLUCAP 132* 140* 140*      Coagulation Profile: No results for input(s): "INR", "PROTIME" in the last 168 hours.   Radiology Studies: VAS [place] LOWER EXTREMITY VENOUS (DVT)  Result Date: 11/23/2022  Lower Venous DVT Study Patient Name:  Brenda Pham  Date of Exam:   11/23/2022 Medical Rec #: [id number]            Accession #:    [id number] Date of Birth: 1941/02/24           Patient Gender: F Patient Age:   36 years Exam Location:  [facility] Procedure:      VAS [place] LOWER EXTREMITY VENOUS (DVT) Referring Phys: Ulyess Blossom RATHORE --------------------------------------------------------------------------------  Indications: Edema.  Comparison Study: Previous exam on 08/02/2015 was negative for DVT Performing Technologist: Ernestene Mention RVT, RDMS  Examination Guidelines: A complete evaluation includes B-mode imaging, spectral Doppler, color Doppler, and power Doppler as needed of all accessible portions of each vessel. Bilateral testing is considered an integral part of a complete examination. Limited examinations for reoccurring indications may be performed as noted. The reflux portion of the exam is performed with the patient in reverse Trendelenburg.  +---------+---------------+---------+-----------+----------+-------------------+ RIGHT    CompressibilityPhasicitySpontaneityPropertiesThrombus Aging      +---------+---------------+---------+-----------+----------+-------------------+ CFV      Full           Yes      Yes                                      +---------+---------------+---------+-----------+----------+-------------------+ SFJ      Full                                                              +---------+---------------+---------+-----------+----------+-------------------+ FV Prox  Full  Yes      Yes                                      +---------+---------------+---------+-----------+----------+-------------------+ FV Mid   Full           Yes      Yes                                      +---------+---------------+---------+-----------+----------+-------------------+ FV DistalFull           Yes      Yes                                      +---------+---------------+---------+-----------+----------+-------------------+ PFV      Full                                                             +---------+---------------+---------+-----------+----------+-------------------+ POP      Full           Yes      Yes                                      +---------+---------------+---------+-----------+----------+-------------------+ PTV      Full                                                             +---------+---------------+---------+-----------+----------+-------------------+ PERO     Full                                         Not well visualized +---------+---------------+---------+-----------+----------+-------------------+   +----+---------------+---------+-----------+----------+--------------+ LEFTCompressibilityPhasicitySpontaneityPropertiesThrombus Aging +----+---------------+---------+-----------+----------+--------------+ CFV Full           Yes      Yes                                 +----+---------------+---------+-----------+----------+--------------+     Summary: RIGHT: - There is no evidence of deep vein thrombosis in the lower extremity.  - No cystic structure found in the popliteal fossa. Subcutaneous edema extending from popliteal fossa to ankle.  LEFT: - No evidence of common femoral vein obstruction.  *See table(s) above for measurements and  observations.    Preliminary    DG Pelvis 1-2 Views  Result Date: 11/22/2022 CLINICAL DATA:  Fall yesterday.  Persistent right hip pain. EXAM: PELVIS - 1-2 VIEW; RIGHT FEMUR 2 VIEWS COMPARISON:  AP pelvis 03/17/2021 FINDINGS: There is diffuse decreased bone mineralization. There is an acute proximal femoral intertrochanteric fracture with up to approximately 5 mm fracture line diastasis but otherwise no significant displacement. Status post total right knee arthroplasty without evidence  of hardware failure. No right knee joint effusion. Surgical clips overlie the pelvis. Mild-to-moderate atherosclerotic calcifications. IMPRESSION: Acute proximal femoral intertrochanteric fracture. Electronically Signed   By: Neita Garnet M.D.   On: 11/22/2022 20:04   DG Femur Min 2 Views Right  Result Date: 11/22/2022 CLINICAL DATA:  Fall yesterday.  Persistent right hip pain. EXAM: PELVIS - 1-2 VIEW; RIGHT FEMUR 2 VIEWS COMPARISON:  AP pelvis 03/17/2021 FINDINGS: There is diffuse decreased bone mineralization. There is an acute proximal femoral intertrochanteric fracture with up to approximately 5 mm fracture line diastasis but otherwise no significant displacement. Status post total right knee arthroplasty without evidence of hardware failure. No right knee joint effusion. Surgical clips overlie the pelvis. Mild-to-moderate atherosclerotic calcifications. IMPRESSION: Acute proximal femoral intertrochanteric fracture. Electronically Signed   By: Neita Garnet M.D.   On: 11/22/2022 20:04   DG Chest 1 View  Result Date: 11/22/2022 CLINICAL DATA:  Fall yesterday. EXAM: CHEST  1 VIEW COMPARISON:  AP chest 09/16/2022, CT chest 05/26/2022, CT chest 07/01/2020, chest two views 08/07/2017 FINDINGS: The patient is rotated to the left. A skin fold overlies the superior right lung and inferior left lung. Heart size is not well evaluated given projection. Mediastinal contours are grossly unremarkable but also limited by  projection. Right axillary surgical clips are again seen. Long-term stable right middle lobe nodule as seen on prior 05/26/2022 and 07/01/2020 CTs. Left breast prosthesis again noted. Moderately decreased lung volumes with bibasilar bronchovascular crowding. No definitive focal airspace opacity. No pleural effusion or pneumothorax. No acute skeletal abnormality. Surgical clips again overlie the inferior right breast. IMPRESSION: 1. No acute cardiopulmonary process. 2. Chronic findings as above. Electronically Signed   By: Neita Garnet M.D.   On: 11/22/2022 20:02   CT Head Wo Contrast  Result Date: 11/22/2022 CLINICAL DATA:  82 year old female with history of head trauma from a fall. EXAM: CT HEAD WITHOUT CONTRAST CT CERVICAL SPINE WITHOUT CONTRAST TECHNIQUE: Multidetector CT imaging of the head and cervical spine was performed following the standard protocol without intravenous contrast. Multiplanar CT image reconstructions of the cervical spine were also generated. RADIATION DOSE REDUCTION: This exam was performed according to the departmental dose-optimization program which includes automated exposure control, adjustment of the mA and/or kV according to patient size and/or use of iterative reconstruction technique. COMPARISON:  No priors. FINDINGS: CT HEAD FINDINGS Brain: Mild cerebral atrophy. Patchy and confluent areas of decreased attenuation are noted throughout the deep and periventricular white matter of the cerebral hemispheres bilaterally, compatible with chronic microvascular ischemic disease. Physiologic calcifications in the basal ganglia bilaterally are incidentally noted. No evidence of acute infarction, hemorrhage, hydrocephalus, extra-axial collection or mass lesion/mass effect. Vascular: No hyperdense vessel or unexpected calcification. Skull: Normal. Negative for fracture or focal lesion. Sinuses/Orbits: No acute finding. Other: None. CT CERVICAL SPINE FINDINGS Alignment: Normal. Skull base  and vertebrae: No acute fracture. No primary bone lesion or focal pathologic process. Soft tissues and spinal canal: No prevertebral fluid or swelling. No visible canal hematoma. Disc levels: Multilevel degenerative disc disease, most evident at C5-C6. Mild to moderate multilevel facet arthropathy bilaterally. Upper chest: Unremarkable. Other: None. IMPRESSION: 1. No evidence of significant acute traumatic injury to the skull, brain or cervical spine. 2. Mild cerebral atrophy with chronic microvascular ischemic changes in the cerebral white matter, as above. 3. Mild multilevel degenerative disc disease and cervical spondylosis, as above. Electronically Signed   By: Trudie Reed M.D.   On: 11/22/2022 05:52   CT Cervical Spine  Wo Contrast  Result Date: 11/22/2022 CLINICAL DATA:  82 year old female with history of head trauma from a fall. EXAM: CT HEAD WITHOUT CONTRAST CT CERVICAL SPINE WITHOUT CONTRAST TECHNIQUE: Multidetector CT imaging of the head and cervical spine was performed following the standard protocol without intravenous contrast. Multiplanar CT image reconstructions of the cervical spine were also generated. RADIATION DOSE REDUCTION: This exam was performed according to the departmental dose-optimization program which includes automated exposure control, adjustment of the mA and/or kV according to patient size and/or use of iterative reconstruction technique. COMPARISON:  No priors. FINDINGS: CT HEAD FINDINGS Brain: Mild cerebral atrophy. Patchy and confluent areas of decreased attenuation are noted throughout the deep and periventricular white matter of the cerebral hemispheres bilaterally, compatible with chronic microvascular ischemic disease. Physiologic calcifications in the basal ganglia bilaterally are incidentally noted. No evidence of acute infarction, hemorrhage, hydrocephalus, extra-axial collection or mass lesion/mass effect. Vascular: No hyperdense vessel or unexpected calcification.  Skull: Normal. Negative for fracture or focal lesion. Sinuses/Orbits: No acute finding. Other: None. CT CERVICAL SPINE FINDINGS Alignment: Normal. Skull base and vertebrae: No acute fracture. No primary bone lesion or focal pathologic process. Soft tissues and spinal canal: No prevertebral fluid or swelling. No visible canal hematoma. Disc levels: Multilevel degenerative disc disease, most evident at C5-C6. Mild to moderate multilevel facet arthropathy bilaterally. Upper chest: Unremarkable. Other: None. IMPRESSION: 1. No evidence of significant acute traumatic injury to the skull, brain or cervical spine. 2. Mild cerebral atrophy with chronic microvascular ischemic changes in the cerebral white matter, as above. 3. Mild multilevel degenerative disc disease and cervical spondylosis, as above. Electronically Signed   By: Trudie Reed M.D.   On: 11/22/2022 05:52   DG Knee Complete 4 Views Right  Result Date: 11/22/2022 CLINICAL DATA:  Fall EXAM: RIGHT KNEE - COMPLETE 4+ VIEW COMPARISON:  None Available. FINDINGS: Changes of right knee replacement. No hardware complicating feature. No acute bony abnormality. Specifically, no fracture, subluxation, or dislocation. No joint effusion. IMPRESSION: Right knee replacement.  No acute bony abnormality. Electronically Signed   By: Charlett Nose M.D.   On: 11/22/2022 03:39       Kathlen Mody M.D. [facility] 11/23/2022, 1:54 PM  Available via Epic secure chat 7am-7pm After 7 pm, please refer to night coverage provider listed on amion.

## 2022-11-24 ENCOUNTER — Ambulatory Visit: Payer: Self-pay | Admitting: Student

## 2022-11-24 DIAGNOSIS — E785 Hyperlipidemia, unspecified: Secondary | ICD-10-CM | POA: Diagnosis not present

## 2022-11-24 DIAGNOSIS — S72001D Fracture of unspecified part of neck of right femur, subsequent encounter for closed fracture with routine healing: Secondary | ICD-10-CM | POA: Diagnosis not present

## 2022-11-24 DIAGNOSIS — E119 Type 2 diabetes mellitus without complications: Secondary | ICD-10-CM | POA: Diagnosis not present

## 2022-11-24 DIAGNOSIS — D649 Anemia, unspecified: Secondary | ICD-10-CM | POA: Diagnosis not present

## 2022-11-24 LAB — GLUCOSE, CAPILLARY
Glucose-Capillary: 104 mg/dL — ABNORMAL HIGH (ref 70–99)
Glucose-Capillary: 120 mg/dL — ABNORMAL HIGH (ref 70–99)
Glucose-Capillary: 133 mg/dL — ABNORMAL HIGH (ref 70–99)
Glucose-Capillary: 140 mg/dL — ABNORMAL HIGH (ref 70–99)
Glucose-Capillary: 155 mg/dL — ABNORMAL HIGH (ref 70–99)
Glucose-Capillary: 87 mg/dL (ref 70–99)

## 2022-11-24 MED ORDER — ENOXAPARIN SODIUM 40 MG/0.4ML IJ SOSY
40.0000 mg | PREFILLED_SYRINGE | Freq: Once | INTRAMUSCULAR | Status: AC
  Administered 2022-11-24: 40 mg via SUBCUTANEOUS
  Filled 2022-11-24: qty 0.4

## 2022-11-24 MED ORDER — OXYCODONE-ACETAMINOPHEN 5-325 MG PO TABS
1.0000 | ORAL_TABLET | Freq: Four times a day (QID) | ORAL | Status: DC | PRN
  Administered 2022-11-24 – 2022-11-28 (×7): 1 via ORAL
  Filled 2022-11-24 (×4): qty 1
  Filled 2022-11-24: qty 2
  Filled 2022-11-24 (×3): qty 1

## 2022-11-24 NOTE — Progress Notes (Signed)
Due to OR availability, surgery scheduled for tomorrow am. I placed order for NPO after MN tonight. Lovenox OTO ordered this am, then hold chemical DVT ppx. Type and screen done.

## 2022-11-24 NOTE — Plan of Care (Signed)
  Problem: Activity: Goal: Risk for activity intolerance will decrease Outcome: Progressing   Problem: Nutrition: Goal: Adequate nutrition will be maintained Outcome: Progressing   Problem: Pain Managment: Goal: General experience of comfort will improve Outcome: Progressing   

## 2022-11-24 NOTE — H&P (View-Only) (Signed)
Due to OR availability, surgery scheduled for tomorrow am. I placed order for NPO after MN tonight. Lovenox OTO ordered this am, then hold chemical DVT ppx. Type and screen done.

## 2022-11-24 NOTE — Progress Notes (Signed)
[facility]                                                                               Brenda Pham, is a 82 y.o. female, DOB - October 14, 1940, WUJ:[id number] Admit date - 11/22/2022    Outpatient Primary MD for the patient is Eloisa Northern, MD  LOS - 2  days    Brief summary   82 y.o. female with medical history significant of diet-controlled type 2 diabetes, hyperlipidemia, history of breast cancer, osteoporosis, B12 deficiency.  She was evaluated in the ED earlier today after a mechanical fall.  CT head/C-spine and x-ray of right knee were negative for acute findings.  She was discharged back to her facility.  Patient returns to the ED this evening complaining of right hip pain.  X-ray of right femur showing acute proximal femoral intertrochanteric fracture.  Chest x-ray showing no acute cardiopulmonary disease.  Assessment & Plan    Assessment and Plan:  Acute proximal femoral intertrochanteric fracture secondary to mechanical fall Orthopedics consulted and plan for surgical repair in the morning due to OR time.  Pain control wit oral oxy and IV morphine.    Unilateral right lower extremity edema of unclear etiology. Doppler ultrasound ordered and negative for DVT.   Mild anemia of chronic disease Continue to monitor    Diet controlled diabetes mellitus Last A1c 6.1 Continue sliding scale insulin. CBG (last 3)  Recent Labs    11/24/22 0433 11/24/22 0739 11/24/22 1140  GLUCAP 155* 140* 133*      Hyperlipidemia Continue to monitor.    Estimated body mass index is 25.86 kg/m as calculated from the following:   Height as of 07/20/22: 5\' 3"  (1.6 m).   Weight as of 07/20/22: 66.2 kg.  Code Status: DNR DVT Prophylaxis:     Level of Care: Level of care: Med-Surg Family Communication: Updated patient's family at bedside.   Disposition Plan:     Remains inpatient appropriate:  surgery scheduled for tomorrow.   Procedures:  Surgical repair of  the RLL scheduled for tomorrow.   Consultants:   Orthopedics.   Antimicrobials:   Anti-infectives (From admission, onward)    None        Medications  Scheduled Meds:  (feeding supplement) PROSource Plus  30 mL Oral BID BM   insulin aspart  0-9 Units Subcutaneous Q4H   Continuous Infusions:  methocarbamol (ROBAXIN) IV     PRN Meds:.methocarbamol (ROBAXIN) IV, morphine injection, naLOXone (NARCAN)  injection, oxyCODONE-acetaminophen    Subjective:   Brenda Pham was seen and examined today.  No new complaints.  Objective:   Vitals:   11/23/22 2032 11/24/22 0138 11/24/22 0437 11/24/22 1036  BP: 133/71 128/67 134/73 (!) 118/57  Pulse: (!) 107 98  93  Resp: 17 17 17 18   Temp: 98.6 F (37 C) 98.6 F (37 C) 98.3 F (36.8 C) 98.7 F (37.1 C)  TempSrc: Oral Oral Oral Oral  SpO2: 96% 100% 93% 93%    Intake/Output Summary (Last 24 hours) at 11/24/2022 1210 Last data filed at 11/24/2022 0437 Gross per 24 hour  Intake 1099.82 ml  Output 750 ml  Net 349.82 ml  There were no vitals filed for this visit.   Exam General exam: Appears calm and comfortable  Respiratory system: Clear to auscultation. Respiratory effort normal. Cardiovascular system: S1 & S2 heard, RRR.  Gastrointestinal system: Abdomen is nondistended, soft and nontender.  Central nervous system: Alert and oriented to person and place.  Extremities: Symmetric 5 x 5 power. Skin: No rashes, Psychiatry: Mood & affect appropriate.     Data Reviewed:  I have personally reviewed following labs and imaging studies   CBC Lab Results  Component Value Date   WBC 7.5 11/23/2022   RBC 4.19 11/23/2022   HGB 11.0 (L) 11/23/2022   HCT 35.8 (L) 11/23/2022   MCV 85.4 11/23/2022   MCH 26.3 11/23/2022   PLT 149 (L) 11/23/2022   MCHC 30.7 11/23/2022   RDW 15.0 11/23/2022   LYMPHSABS 1.1 11/22/2022   MONOABS 0.7 11/22/2022   EOSABS 0.1 11/22/2022   BASOSABS 0.1 11/22/2022     Last metabolic  panel Lab Results  Component Value Date   NA 136 11/23/2022   K 3.9 11/23/2022   CL 101 11/23/2022   CO2 26 11/23/2022   BUN 17 11/23/2022   CREATININE 0.78 11/23/2022   GLUCOSE 150 (H) 11/23/2022   GFRNONAA >60 11/23/2022   GFRAA >60 06/06/2015   CALCIUM 8.7 (L) 11/23/2022   PHOS 3.6 09/17/2022   PROT 6.4 (L) 09/18/2022   ALBUMIN 3.6 09/18/2022   BILITOT 0.7 09/18/2022   ALKPHOS 55 09/18/2022   AST 39 09/18/2022   ALT 31 09/18/2022   ANIONGAP 9 11/23/2022    CBG (last 3)  Recent Labs    11/24/22 0433 11/24/22 0739 11/24/22 1140  GLUCAP 155* 140* 133*      Coagulation Profile: No results for input(s): "INR", "PROTIME" in the last 168 hours.   Radiology Studies: VAS [place] LOWER EXTREMITY VENOUS (DVT)  Result Date: 11/23/2022  Lower Venous DVT Study Patient Name:  Brenda Pham  Date of Exam:   11/23/2022 Medical Rec #: [id number]            Accession #:    [id number] Date of Birth: 26-Apr-1941           Patient Gender: F Patient Age:   31 years Exam Location:  [facility] Procedure:      VAS [place] LOWER EXTREMITY VENOUS (DVT) Referring Phys: Ulyess Blossom RATHORE --------------------------------------------------------------------------------  Indications: Edema.  Comparison Study: Previous exam on 08/02/2015 was negative for DVT Performing Technologist: Ernestene Mention RVT, RDMS  Examination Guidelines: A complete evaluation includes B-mode imaging, spectral Doppler, color Doppler, and power Doppler as needed of all accessible portions of each vessel. Bilateral testing is considered an integral part of a complete examination. Limited examinations for reoccurring indications may be performed as noted. The reflux portion of the exam is performed with the patient in reverse Trendelenburg.  +---------+---------------+---------+-----------+----------+-------------------+ RIGHT    CompressibilityPhasicitySpontaneityPropertiesThrombus Aging       +---------+---------------+---------+-----------+----------+-------------------+ CFV      Full           Yes      Yes                                      +---------+---------------+---------+-----------+----------+-------------------+ SFJ      Full                                                             +---------+---------------+---------+-----------+----------+-------------------+  FV Prox  Full           Yes      Yes                                      +---------+---------------+---------+-----------+----------+-------------------+ FV Mid   Full           Yes      Yes                                      +---------+---------------+---------+-----------+----------+-------------------+ FV DistalFull           Yes      Yes                                      +---------+---------------+---------+-----------+----------+-------------------+ PFV      Full                                                             +---------+---------------+---------+-----------+----------+-------------------+ POP      Full           Yes      Yes                                      +---------+---------------+---------+-----------+----------+-------------------+ PTV      Full                                                             +---------+---------------+---------+-----------+----------+-------------------+ PERO     Full                                         Not well visualized +---------+---------------+---------+-----------+----------+-------------------+   +----+---------------+---------+-----------+----------+--------------+ LEFTCompressibilityPhasicitySpontaneityPropertiesThrombus Aging +----+---------------+---------+-----------+----------+--------------+ CFV Full           Yes      Yes                                 +----+---------------+---------+-----------+----------+--------------+     Summary: RIGHT: - There is no evidence of deep  vein thrombosis in the lower extremity.  - No cystic structure found in the popliteal fossa. Subcutaneous edema extending from popliteal fossa to ankle.  LEFT: - No evidence of common femoral vein obstruction.  *See table(s) above for measurements and observations. Electronically signed by Heath Lark on 11/23/2022 at 7:42:30 PM.    Final    DG Pelvis 1-2 Views  Result Date: 11/22/2022 CLINICAL DATA:  Fall yesterday.  Persistent right hip pain. EXAM: PELVIS - 1-2 VIEW; RIGHT FEMUR 2 VIEWS COMPARISON:  AP pelvis 03/17/2021 FINDINGS: There is diffuse decreased bone mineralization. There is an acute proximal femoral  intertrochanteric fracture with up to approximately 5 mm fracture line diastasis but otherwise no significant displacement. Status post total right knee arthroplasty without evidence of hardware failure. No right knee joint effusion. Surgical clips overlie the pelvis. Mild-to-moderate atherosclerotic calcifications. IMPRESSION: Acute proximal femoral intertrochanteric fracture. Electronically Signed   By: Neita Garnet M.D.   On: 11/22/2022 20:04   DG Femur Min 2 Views Right  Result Date: 11/22/2022 CLINICAL DATA:  Fall yesterday.  Persistent right hip pain. EXAM: PELVIS - 1-2 VIEW; RIGHT FEMUR 2 VIEWS COMPARISON:  AP pelvis 03/17/2021 FINDINGS: There is diffuse decreased bone mineralization. There is an acute proximal femoral intertrochanteric fracture with up to approximately 5 mm fracture line diastasis but otherwise no significant displacement. Status post total right knee arthroplasty without evidence of hardware failure. No right knee joint effusion. Surgical clips overlie the pelvis. Mild-to-moderate atherosclerotic calcifications. IMPRESSION: Acute proximal femoral intertrochanteric fracture. Electronically Signed   By: Neita Garnet M.D.   On: 11/22/2022 20:04   DG Chest 1 View  Result Date: 11/22/2022 CLINICAL DATA:  Fall yesterday. EXAM: CHEST  1 VIEW COMPARISON:  AP chest  09/16/2022, CT chest 05/26/2022, CT chest 07/01/2020, chest two views 08/07/2017 FINDINGS: The patient is rotated to the left. A skin fold overlies the superior right lung and inferior left lung. Heart size is not well evaluated given projection. Mediastinal contours are grossly unremarkable but also limited by projection. Right axillary surgical clips are again seen. Long-term stable right middle lobe nodule as seen on prior 05/26/2022 and 07/01/2020 CTs. Left breast prosthesis again noted. Moderately decreased lung volumes with bibasilar bronchovascular crowding. No definitive focal airspace opacity. No pleural effusion or pneumothorax. No acute skeletal abnormality. Surgical clips again overlie the inferior right breast. IMPRESSION: 1. No acute cardiopulmonary process. 2. Chronic findings as above. Electronically Signed   By: Neita Garnet M.D.   On: 11/22/2022 20:02       Kathlen Mody M.D. [facility] 11/24/2022, 12:10 PM  Available via Epic secure chat 7am-7pm After 7 pm, please refer to night coverage provider listed on amion.

## 2022-11-24 NOTE — Anesthesia Preprocedure Evaluation (Addendum)
Anesthesia Evaluation  Patient identified by MRN, date of birth, ID band Patient awake    Reviewed: Allergy & Precautions, NPO status , Patient's Chart, lab work & pertinent test results  History of Anesthesia Complications (+) PONV and history of anesthetic complications  Airway Mallampati: II  TM Distance: >3 FB Neck ROM: Full    Dental no notable dental hx. (+) Dental Advisory Given   Pulmonary former smoker   Pulmonary exam normal        Cardiovascular negative cardio ROS Normal cardiovascular exam     Neuro/Psych negative neurological ROS     GI/Hepatic negative GI ROS, Neg liver ROS,,,  Endo/Other  diabetes, Type 2, Oral Hypoglycemic Agents    Renal/GU negative Renal ROS     Musculoskeletal  (+) Arthritis ,    Abdominal   Peds  Hematology  (+) Blood dyscrasia, anemia   Anesthesia Other Findings   Reproductive/Obstetrics                             Anesthesia Physical Anesthesia Plan  ASA: 3  Anesthesia Plan: Spinal and MAC   Post-op Pain Management: Tylenol PO (pre-op)*   Induction:   PONV Risk Score and Plan: 3 and Propofol infusion, Ondansetron, Treatment may vary due to age or medical condition and Diphenhydramine  Airway Management Planned: Natural Airway and Simple Face Mask  Additional Equipment:   Intra-op Plan:   Post-operative Plan:   Informed Consent: I have reviewed the patients History and Physical, chart, labs and discussed the procedure including the risks, benefits and alternatives for the proposed anesthesia with the patient or authorized representative who has indicated his/her understanding and acceptance.   Patient has DNR.  Discussed DNR with power of attorney and Suspend DNR.   Dental advisory given  Plan Discussed with: Anesthesiologist and CRNA  Anesthesia Plan Comments:         Anesthesia Quick Evaluation

## 2022-11-25 ENCOUNTER — Inpatient Hospital Stay (HOSPITAL_COMMUNITY): Payer: Medicare Other

## 2022-11-25 ENCOUNTER — Encounter (HOSPITAL_COMMUNITY)
Admission: EM | Disposition: A | Discharge status: Skilled Nursing Facility | Payer: Self-pay | Source: Home / Self Care | Attending: Internal Medicine

## 2022-11-25 ENCOUNTER — Inpatient Hospital Stay (HOSPITAL_COMMUNITY): Payer: Medicare Other | Admitting: Anesthesiology

## 2022-11-25 DIAGNOSIS — E785 Hyperlipidemia, unspecified: Secondary | ICD-10-CM | POA: Diagnosis not present

## 2022-11-25 DIAGNOSIS — E1140 Type 2 diabetes mellitus with diabetic neuropathy, unspecified: Secondary | ICD-10-CM | POA: Diagnosis not present

## 2022-11-25 DIAGNOSIS — S72141A Displaced intertrochanteric fracture of right femur, initial encounter for closed fracture: Secondary | ICD-10-CM | POA: Diagnosis not present

## 2022-11-25 DIAGNOSIS — Z7984 Long term (current) use of oral hypoglycemic drugs: Secondary | ICD-10-CM

## 2022-11-25 DIAGNOSIS — S72001D Fracture of unspecified part of neck of right femur, subsequent encounter for closed fracture with routine healing: Secondary | ICD-10-CM | POA: Diagnosis not present

## 2022-11-25 DIAGNOSIS — E119 Type 2 diabetes mellitus without complications: Secondary | ICD-10-CM | POA: Diagnosis not present

## 2022-11-25 DIAGNOSIS — D649 Anemia, unspecified: Secondary | ICD-10-CM | POA: Diagnosis not present

## 2022-11-25 HISTORY — PX: INTRAMEDULLARY (IM) NAIL INTERTROCHANTERIC: SHX5875

## 2022-11-25 LAB — CBC
HCT: 34.1 % — ABNORMAL LOW (ref 36.0–46.0)
Hemoglobin: 10.6 g/dL — ABNORMAL LOW (ref 12.0–15.0)
MCH: 26.4 pg (ref 26.0–34.0)
MCHC: 31.1 g/dL (ref 30.0–36.0)
MCV: 85 fL (ref 80.0–100.0)
Platelets: 155 10*3/uL (ref 150–400)
RBC: 4.01 MIL/uL (ref 3.87–5.11)
RDW: 14.9 % (ref 11.5–15.5)
WBC: 7.3 10*3/uL (ref 4.0–10.5)
nRBC: 0 % (ref 0.0–0.2)

## 2022-11-25 LAB — GLUCOSE, CAPILLARY
Glucose-Capillary: 106 mg/dL — ABNORMAL HIGH (ref 70–99)
Glucose-Capillary: 119 mg/dL — ABNORMAL HIGH (ref 70–99)
Glucose-Capillary: 127 mg/dL — ABNORMAL HIGH (ref 70–99)
Glucose-Capillary: 128 mg/dL — ABNORMAL HIGH (ref 70–99)
Glucose-Capillary: 144 mg/dL — ABNORMAL HIGH (ref 70–99)
Glucose-Capillary: 160 mg/dL — ABNORMAL HIGH (ref 70–99)
Glucose-Capillary: 192 mg/dL — ABNORMAL HIGH (ref 70–99)
Glucose-Capillary: 193 mg/dL — ABNORMAL HIGH (ref 70–99)

## 2022-11-25 LAB — CREATININE, SERUM
Creatinine, Ser: 0.64 mg/dL (ref 0.44–1.00)
GFR, Estimated: >60 mL/min (ref >60)

## 2022-11-25 SURGERY — FIXATION, FRACTURE, INTERTROCHANTERIC, WITH INTRAMEDULLARY ROD
Anesthesia: Monitor Anesthesia Care | Site: Hip | Laterality: Right

## 2022-11-25 MED ORDER — BUPIVACAINE IN DEXTROSE 0.75-8.25 % IT SOLN
INTRATHECAL | Status: DC | PRN
  Administered 2022-11-25: 1.7 mL via INTRATHECAL

## 2022-11-25 MED ORDER — DOCUSATE SODIUM 100 MG PO CAPS
100.0000 mg | ORAL_CAPSULE | Freq: Two times a day (BID) | ORAL | Status: DC
  Administered 2022-11-25 – 2022-11-26 (×2): 100 mg via ORAL
  Filled 2022-11-25 (×2): qty 1

## 2022-11-25 MED ORDER — MORPHINE SULFATE (PF) 2 MG/ML IV SOLN
0.5000 mg | INTRAVENOUS | Status: DC | PRN
  Administered 2022-11-25 – 2022-11-26 (×2): 1 mg via INTRAVENOUS
  Filled 2022-11-25 (×2): qty 1

## 2022-11-25 MED ORDER — FENTANYL CITRATE PF 50 MCG/ML IJ SOSY
25.0000 ug | PREFILLED_SYRINGE | INTRAMUSCULAR | Status: DC | PRN
  Administered 2022-11-25: 25 ug via INTRAVENOUS
  Administered 2022-11-25: 50 ug via INTRAVENOUS

## 2022-11-25 MED ORDER — HYDROCODONE-ACETAMINOPHEN 7.5-325 MG PO TABS
1.0000 | ORAL_TABLET | ORAL | Status: DC | PRN
  Administered 2022-11-26 – 2022-11-28 (×4): 1 via ORAL
  Filled 2022-11-25 (×4): qty 1

## 2022-11-25 MED ORDER — DEXAMETHASONE SODIUM PHOSPHATE 10 MG/ML IJ SOLN
INTRAMUSCULAR | Status: AC
  Filled 2022-11-25: qty 1

## 2022-11-25 MED ORDER — CEFAZOLIN SODIUM-DEXTROSE 2-4 GM/100ML-% IV SOLN
INTRAVENOUS | Status: AC
  Filled 2022-11-25: qty 100

## 2022-11-25 MED ORDER — PROPOFOL 500 MG/50ML IV EMUL
INTRAVENOUS | Status: DC | PRN
  Administered 2022-11-25: 25 ug/kg/min via INTRAVENOUS

## 2022-11-25 MED ORDER — TRANEXAMIC ACID-NACL 1000-0.7 MG/100ML-% IV SOLN
INTRAVENOUS | Status: DC | PRN
  Administered 2022-11-25: 1000 mg via INTRAVENOUS

## 2022-11-25 MED ORDER — ACETAMINOPHEN 325 MG PO TABS
325.0000 mg | ORAL_TABLET | Freq: Four times a day (QID) | ORAL | Status: DC | PRN
  Administered 2022-11-26: 500 mg via ORAL

## 2022-11-25 MED ORDER — SODIUM CHLORIDE 0.9 % IR SOLN
Status: DC | PRN
  Administered 2022-11-25: 1000 mL

## 2022-11-25 MED ORDER — TRANEXAMIC ACID-NACL 1000-0.7 MG/100ML-% IV SOLN
INTRAVENOUS | Status: AC
  Filled 2022-11-25: qty 100

## 2022-11-25 MED ORDER — HYDROCODONE-ACETAMINOPHEN 5-325 MG PO TABS: 1.0000 | ORAL_TABLET | ORAL | 0 refills | Status: DC | PRN

## 2022-11-25 MED ORDER — ISOPROPYL ALCOHOL 70 % SOLN
Status: DC | PRN
  Administered 2022-11-25: 1 via TOPICAL

## 2022-11-25 MED ORDER — PHENOL 1.4 % MT LIQD: 1.0000 | OROMUCOSAL | Status: DC | PRN

## 2022-11-25 MED ORDER — ONDANSETRON HCL 4 MG/2ML IJ SOLN
INTRAMUSCULAR | Status: AC
  Filled 2022-11-25: qty 2

## 2022-11-25 MED ORDER — ENOXAPARIN SODIUM 30 MG/0.3ML IJ SOSY
30.0000 mg | PREFILLED_SYRINGE | INTRAMUSCULAR | Status: DC
  Administered 2022-11-26: 30 mg via SUBCUTANEOUS
  Filled 2022-11-25: qty 0.3

## 2022-11-25 MED ORDER — ACETAMINOPHEN 500 MG PO TABS
500.0000 mg | ORAL_TABLET | Freq: Four times a day (QID) | ORAL | Status: AC
  Filled 2022-11-25: qty 1

## 2022-11-25 MED ORDER — ONDANSETRON HCL 4 MG PO TABS: 4.0000 mg | ORAL_TABLET | Freq: Four times a day (QID) | ORAL | Status: DC | PRN

## 2022-11-25 MED ORDER — ASPIRIN 81 MG PO CHEW: 81.0000 mg | CHEWABLE_TABLET | Freq: Two times a day (BID) | ORAL | 0 refills | Status: AC

## 2022-11-25 MED ORDER — MENTHOL 3 MG MT LOZG: 1.0000 | LOZENGE | OROMUCOSAL | Status: DC | PRN

## 2022-11-25 MED ORDER — ONDANSETRON HCL 4 MG/2ML IJ SOLN
INTRAMUSCULAR | Status: DC | PRN
  Administered 2022-11-25: 4 mg via INTRAVENOUS

## 2022-11-25 MED ORDER — FENTANYL CITRATE PF 50 MCG/ML IJ SOSY
PREFILLED_SYRINGE | INTRAMUSCULAR | Status: AC
  Filled 2022-11-25: qty 1

## 2022-11-25 MED ORDER — DEXAMETHASONE SODIUM PHOSPHATE 10 MG/ML IJ SOLN
INTRAMUSCULAR | Status: DC | PRN
  Administered 2022-11-25: 5 mg via INTRAVENOUS

## 2022-11-25 MED ORDER — CEFAZOLIN SODIUM-DEXTROSE 2-4 GM/100ML-% IV SOLN
2.0000 g | Freq: Four times a day (QID) | INTRAVENOUS | Status: AC
  Administered 2022-11-25 (×2): 2 g via INTRAVENOUS
  Filled 2022-11-25 (×2): qty 100

## 2022-11-25 MED ORDER — METOCLOPRAMIDE HCL 5 MG/ML IJ SOLN: 5.0000 mg | Freq: Three times a day (TID) | INTRAMUSCULAR | Status: DC | PRN

## 2022-11-25 MED ORDER — ACETAMINOPHEN 10 MG/ML IV SOLN
1000.0000 mg | Freq: Once | INTRAVENOUS | Status: DC | PRN
  Administered 2022-11-25: 1000 mg via INTRAVENOUS

## 2022-11-25 MED ORDER — METOCLOPRAMIDE HCL 5 MG PO TABS: 5.0000 mg | ORAL_TABLET | Freq: Three times a day (TID) | ORAL | Status: DC | PRN

## 2022-11-25 MED ORDER — FENTANYL CITRATE (PF) 100 MCG/2ML IJ SOLN
INTRAMUSCULAR | Status: AC
  Filled 2022-11-25: qty 2

## 2022-11-25 MED ORDER — SODIUM CHLORIDE 0.9 % IV SOLN: INTRAVENOUS | Status: DC

## 2022-11-25 MED ORDER — CEFAZOLIN SODIUM-DEXTROSE 2-3 GM-%(50ML) IV SOLR
INTRAVENOUS | Status: DC | PRN
  Administered 2022-11-25: 2 g via INTRAVENOUS

## 2022-11-25 MED ORDER — OXYCODONE HCL 5 MG/5ML PO SOLN: 5.0000 mg | Freq: Once | ORAL | Status: DC | PRN

## 2022-11-25 MED ORDER — OXYCODONE HCL 5 MG PO TABS: 5.0000 mg | ORAL_TABLET | Freq: Once | ORAL | Status: DC | PRN

## 2022-11-25 MED ORDER — PROPOFOL 1000 MG/100ML IV EMUL
INTRAVENOUS | Status: AC
  Filled 2022-11-25: qty 100

## 2022-11-25 MED ORDER — AMISULPRIDE (ANTIEMETIC) 5 MG/2ML IV SOLN: 10.0000 mg | Freq: Once | INTRAVENOUS | Status: DC | PRN

## 2022-11-25 MED ORDER — PHENYLEPHRINE HCL-NACL 20-0.9 MG/250ML-% IV SOLN
INTRAVENOUS | Status: DC | PRN
  Administered 2022-11-25: 25 ug/min via INTRAVENOUS

## 2022-11-25 MED ORDER — ONDANSETRON HCL 4 MG/2ML IJ SOLN: 4.0000 mg | Freq: Four times a day (QID) | INTRAMUSCULAR | Status: DC | PRN

## 2022-11-25 MED ORDER — LACTATED RINGERS IV SOLN: INTRAVENOUS | Status: DC

## 2022-11-25 MED ORDER — FENTANYL CITRATE (PF) 100 MCG/2ML IJ SOLN
INTRAMUSCULAR | Status: DC | PRN
  Administered 2022-11-25: 50 ug via INTRAVENOUS

## 2022-11-25 MED ORDER — ACETAMINOPHEN 10 MG/ML IV SOLN
INTRAVENOUS | Status: AC
  Filled 2022-11-25: qty 100

## 2022-11-25 MED ORDER — HYDROCODONE-ACETAMINOPHEN 5-325 MG PO TABS: 1.0000 | ORAL_TABLET | ORAL | Status: DC | PRN

## 2022-11-25 SURGICAL SUPPLY — 42 items
ADH SKN CLS APL DERMABOND .7 (GAUZE/BANDAGES/DRESSINGS) ×1
APL PRP STRL LF DISP 70% ISPRP (MISCELLANEOUS) ×1
BAG COUNTER SPONGE SURGICOUNT (BAG) IMPLANT
BAG SPEC THK2 15X12 ZIP CLS (MISCELLANEOUS)
BAG SPNG CNTER NS LX DISP (BAG)
BAG ZIPLOCK 12X15 (MISCELLANEOUS) IMPLANT
BIT DRILL CANN LG 4.3MM (BIT) IMPLANT
CHLORAPREP W/TINT 26 (MISCELLANEOUS) ×1 IMPLANT
COVER PERINEAL POST (MISCELLANEOUS) ×1 IMPLANT
COVER SURGICAL LIGHT HANDLE (MISCELLANEOUS) ×1 IMPLANT
DERMABOND ADVANCED .7 DNX12 (GAUZE/BANDAGES/DRESSINGS) ×1 IMPLANT
DRAPE C-ARM 42X120 X-RAY (DRAPES) ×1 IMPLANT
DRAPE C-ARMOR (DRAPES) ×1 IMPLANT
DRAPE IMP U-DRAPE 54X76 (DRAPES) ×2 IMPLANT
DRAPE SHEET LG 3/4 BI-LAMINATE (DRAPES) ×3 IMPLANT
DRAPE STERI IOBAN 125X83 (DRAPES) ×1 IMPLANT
DRAPE U-SHAPE 47X51 STRL (DRAPES) ×2 IMPLANT
DRILL BIT CANN LG 4.3MM (BIT) ×1
DRSG AQUACEL AG 3.5X4 (GAUZE/BANDAGES/DRESSINGS) ×1 IMPLANT
DRSG AQUACEL AG ADV 3.5X 6 (GAUZE/BANDAGES/DRESSINGS) ×1 IMPLANT
GAUZE SPONGE 4X4 12PLY STRL (GAUZE/BANDAGES/DRESSINGS) ×1 IMPLANT
GLOVE BIO SURGEON STRL SZ7 (GLOVE) ×1 IMPLANT
GLOVE BIO SURGEON STRL SZ8.5 (GLOVE) ×2 IMPLANT
GLOVE BIOGEL PI IND STRL 7.5 (GLOVE) ×1 IMPLANT
GLOVE BIOGEL PI IND STRL 8.5 (GLOVE) ×1 IMPLANT
GOWN SPEC L3 XXLG W/TWL (GOWN DISPOSABLE) ×1 IMPLANT
GOWN STRL REUS W/ TWL XL LVL3 (GOWN DISPOSABLE) ×1 IMPLANT
GOWN STRL REUS W/TWL XL LVL3 (GOWN DISPOSABLE) ×1
GUIDEPIN VERSANAIL DSP 3.2X444 (ORTHOPEDIC DISPOSABLE SUPPLIES) IMPLANT
HFN 125 DEG 9MM X 180MM (Nail) IMPLANT
HOOD PEEL AWAY T7 (MISCELLANEOUS) ×1 IMPLANT
KIT BASIN OR (CUSTOM PROCEDURE TRAY) ×1 IMPLANT
KIT TURNOVER KIT A (KITS) IMPLANT
MANIFOLD NEPTUNE II (INSTRUMENTS) ×1 IMPLANT
MARKER SKIN DUAL TIP RULER LAB (MISCELLANEOUS) ×1 IMPLANT
PACK GENERAL/GYN (CUSTOM PROCEDURE TRAY) ×1 IMPLANT
SCREW BONE CORTICAL 5.0X3 (Screw) IMPLANT
SCREW CANN THRD AFF 10.5X100 (Screw) IMPLANT
SUT MNCRL AB 3-0 PS2 18 (SUTURE) ×1 IMPLANT
SUT MON AB 2-0 CT1 36 (SUTURE) ×1 IMPLANT
SUT VIC AB 1 CT1 36 (SUTURE) ×1 IMPLANT
TOWEL OR 17X26 10 PK STRL BLUE (TOWEL DISPOSABLE) ×1 IMPLANT

## 2022-11-25 NOTE — Plan of Care (Signed)
Refused PO intake this shift.  Slept well.  NPO after midnight.

## 2022-11-25 NOTE — Plan of Care (Signed)
  Problem: Health Behavior/Discharge Planning: Goal: Ability to manage health-related needs will improve Outcome: Progressing   Problem: Coping: Goal: Level of anxiety will decrease Outcome: Progressing   Problem: Pain Managment: Goal: General experience of comfort will improve Outcome: Progressing   

## 2022-11-25 NOTE — Transfer of Care (Signed)
Immediate Anesthesia Transfer of Care Note  Patient: Brenda Pham  Procedure(s) Performed: INTRAMEDULLARY (IM) NAIL INTERTROCHANTERIC (Right: Hip)  Patient Location: PACU  Anesthesia Type:MAC and Spinal  Level of Consciousness: awake, alert , oriented, and patient cooperative  Airway & Oxygen Therapy: Patient Spontanous Breathing and Patient connected to face mask oxygen  Post-op Assessment: Report given to RN and Post -op Vital signs reviewed and stable  Post vital signs: Reviewed and stable  Last Vitals:  Vitals Value Taken Time  BP    Temp    Pulse    Resp    SpO2      Last Pain:  Vitals:   11/25/22 0813  TempSrc: Oral  PainSc:       Patients Stated Pain Goal: 2 (11/24/22 1620)  Complications: No notable events documented.

## 2022-11-25 NOTE — Progress Notes (Signed)
Patient agitated on return to floor after surgical procedure. Refused assessment and treatment. Allowing time for family to reorient patient and attempt again.

## 2022-11-25 NOTE — Anesthesia Procedure Notes (Signed)
Procedure Name: MAC Date/Time: 11/25/2022 10:26 AM  Performed by: Elisabeth Cara, CRNAPre-anesthesia Checklist: Patient identified, Emergency Drugs available, Suction available, Patient being monitored and Timeout performed Patient Re-evaluated:Patient Re-evaluated prior to induction Oxygen Delivery Method: Simple face mask Placement Confirmation: CO2 detector Dental Injury: Teeth and Oropharynx as per pre-operative assessment

## 2022-11-25 NOTE — Op Note (Signed)
OPERATIVE REPORT  SURGEON: Samson Frederic, MD   ASSISTANT: Clint Bolder, PA-C.  PREOPERATIVE DIAGNOSIS: Right intertrochanteric femur fracture.   POSTOPERATIVE DIAGNOSIS: Right intertrochanteric femur fracture.   PROCEDURE: Intramedullary fixation, Right femur.   IMPLANTS: Biomet Affixus Hip Fracture Nail, 9 by 180 mm, 125 degrees. 10.5 x 100 mm Hip Fracture Nail Lag Screw. 5 x 34 mm distal interlocking screw 1.  ANESTHESIA:  MAC and Spinal  ESTIMATED BLOOD LOSS:-100 mL    ANTIBIOTICS:  2 g Ancef.  DRAINS: None.  COMPLICATIONS: None.   CONDITION: PACU - hemodynamically stable.Marland Kitchen   BRIEF CLINICAL NOTE: Brenda Pham is a 82 y.o. female who presented with an intertrochanteric femur fracture. The patient was admitted to the hospitalist service and underwent perioperative risk stratification and medical optimization. The risks, benefits, and alternatives to the procedure were explained, and the patient elected to proceed.  PROCEDURE IN DETAIL: Surgical site was marked by myself. The patient was taken to the operating room and anesthesia was induced on the bed. The patient was then transferred to the [facility] table and the nonoperative lower extremity was scissored underneath the operative side. The fracture was reduced with traction, internal rotation, and adduction. The hip was prepped and draped in the normal sterile surgical fashion. Timeout was called verifying side and site of surgery. Preop antibiotics were given with 60 minutes of beginning the procedure.  Fluoroscopy was used to define the patient's anatomy. A 4 cm incision was made just proximal to the tip of the greater trochanter. The awl was used to obtain the standard starting point for a trochanteric entry nail under fluoroscopic control. The guidepin was placed. The entry reamer was used to open the proximal femur.  On the back table, the nail was assembled onto the jig. The nail was placed into the femur without any  difficulty. Through a separate stab incision, the cannula was placed down to the bone in preparation for the cephalomedullary device. A guidepin was placed into the femoral head using AP and lateral fluoroscopy views. The pin was measured, and then reaming was performed to the appropriate depth. The lag screw was inserted to the appropriate depth. The fracture was compressed through the jig. The setscrew was tightened and then loosened one quarter turn. A separate stab incision was created, and the distal interlocking screw was placed using standard AO technique. The jig was removed. Final AP and lateral fluoroscopy views were obtained to confirm fracture reduction and hardware placement. Tip apex distance was appropriate. There was no chondral penetration.  The wounds were copiously irrigated with saline. The wound was closed in layers with #1 Vicryl for the fascia, 2-0 Monocryl for the deep dermal layer, and 3-0 Monocryl subcuticular stitch. Glue was applied to the skin. Once the glue was fully hardened, sterile dressing was applied. The patient was then awakened from anesthesia and taken to the PACU in stable condition. Sponge needle and instrument counts were correct at the end of the case 2. There were no known complications.  We will readmit the patient to the hospitalist. Weightbearing status will be weightbearing as tolerated with a walker. We will begin Lovenox for DVT prophylaxis. The patient will work with physical therapy and undergo disposition planning.  Please note that a surgical assistant was a medical necessity for this procedure to perform it in a safe and expeditious manner. Assistant was necessary to provide appropriate retraction of vital neurovascular structures, to prevent femoral fracture, and to allow for anatomic placement of the prosthesis.

## 2022-11-25 NOTE — Anesthesia Postprocedure Evaluation (Signed)
Anesthesia Post Note  Patient: Brenda Pham  Procedure(s) Performed: INTRAMEDULLARY (IM) NAIL INTERTROCHANTERIC (Right: Hip)     Patient location during evaluation: PACU Anesthesia Type: MAC and Spinal Level of consciousness: awake and alert Pain management: pain level controlled Vital Signs Assessment: post-procedure vital signs reviewed and stable Respiratory status: spontaneous breathing and respiratory function stable Cardiovascular status: blood pressure returned to baseline and stable Postop Assessment: spinal receding Anesthetic complications: no  No notable events documented.  Last Vitals:  Vitals:   11/25/22 1245 11/25/22 1300  BP: 121/73 (!) 145/70  Pulse: 98 100  Resp: 12 20  Temp:    SpO2: 100% 91%               Garris Melhorn DANIEL

## 2022-11-25 NOTE — Progress Notes (Signed)
[facility]                                                                               Brenda Pham, is a 82 y.o. female, DOB - [birth year], UJW:[id number] Admit date - 11/22/2022    Outpatient Primary MD for the patient is Eloisa Northern, MD  LOS - 3  days    Brief summary   82 y.o. female with medical history significant of diet-controlled type 2 diabetes, hyperlipidemia, history of breast cancer, osteoporosis, B12 deficiency.  She was evaluated in the ED earlier today after a mechanical fall.  CT head/C-spine and x-ray of right knee were negative for acute findings.  She was discharged back to her facility.  Patient returns to the ED this evening complaining of right hip pain.  X-ray of right femur showing acute proximal femoral intertrochanteric fracture.  Chest x-ray showing no acute cardiopulmonary disease.  Assessment & Plan    Assessment and Plan:  Acute proximal femoral intertrochanteric fracture secondary to mechanical fall Orthopedics consulted and plan for surgical repair in the morning due to OR time.  Pain control wit oral oxy and IV morphine.    Unilateral right lower extremity edema of unclear etiology. Doppler ultrasound ordered and negative for DVT.   Mild anemia of chronic disease Continue to monitor    Diet controlled diabetes mellitus Last A1c 6.1 Continue sliding scale insulin. CBG (last 3)  Recent Labs    11/25/22 0356 11/25/22 0726 11/25/22 0928  GLUCAP 127* 144* 106*      Hyperlipidemia Continue to monitor.    Estimated body mass index is 24.92 kg/m as calculated from the following:   Height as of this encounter: 5\' 3"  (1.6 m).   Weight as of this encounter: 63.8 kg.  Code Status: DNR DVT Prophylaxis:     Level of Care: Level of care: Med-Surg Family Communication: Updated patient's family at bedside.   Disposition Plan:     Remains inpatient appropriate:  surgery scheduled for tomorrow.   Procedures:   Surgical repair of the RLL scheduled for tomorrow.   Consultants:   Orthopedics.   Antimicrobials:   Anti-infectives (From admission, onward)    Start     Dose/Rate Route Frequency Ordered Stop   11/25/22 0925  ceFAZolin (ANCEF) 2-4 GM/100ML-% IVPB       Note to Pharmacy: Vevelyn Royals D: cabinet override      11/25/22 0925 11/25/22 2129        Medications  Scheduled Meds:  [MAR Hold] (feeding supplement) PROSource Plus  30 mL Oral BID BM   [MAR Hold] insulin aspart  0-9 Units Subcutaneous Q4H   Continuous Infusions:  ceFAZolin     lactated ringers 20 mL/hr at 11/25/22 1023   [MAR Hold] methocarbamol (ROBAXIN) IV     PRN Meds:.ceFAZolin, [MAR Hold] methocarbamol (ROBAXIN) IV, [MAR Hold]  morphine injection, [MAR Hold] naLOXone (NARCAN)  injection, [MAR Hold] oxyCODONE-acetaminophen    Subjective:   Brenda Pham was seen and examined today.  No new complaints.  Objective:   Vitals:   11/24/22 1940 11/24/22 2119 11/25/22 0543 11/25/22 0813  BP:  (!) 167/84 (!) 145/78 128/66  Pulse:   Marland Kitchen)  105 99  Resp:  14 15 15   Temp:  98.2 F (36.8 C) 98.7 F (37.1 C) 98.1 F (36.7 C)  TempSrc:  Oral Oral Oral  SpO2:  97% 92% 93%  Weight: 63.8 kg     Height: 5\' 3"  (1.6 m)       Intake/Output Summary (Last 24 hours) at 11/25/2022 1051 Last data filed at 11/25/2022 1034 Gross per 24 hour  Intake 50 ml  Output 200 ml  Net -150 ml   Filed Weights   11/24/22 1940  Weight: 63.8 kg     Exam General exam: Appears calm and comfortable  Respiratory system: Clear to auscultation. Respiratory effort normal. Cardiovascular system: S1 & S2 heard, RRR.  Gastrointestinal system: Abdomen is nondistended, soft and nontender.  Central nervous system: Alert and oriented to person and place.  Extremities: Symmetric 5 x 5 power. Skin: No rashes, Psychiatry: Mood & affect appropriate.     Data Reviewed:  I have personally reviewed following labs and imaging  studies   CBC Lab Results  Component Value Date   WBC 7.6 11/25/2022   RBC 3.83 (L) 11/25/2022   HGB 10.1 (L) 11/25/2022   HCT 33.1 (L) 11/25/2022   MCV 86.4 11/25/2022   MCH 26.4 11/25/2022   PLT 139 (L) 11/25/2022   MCHC 30.5 11/25/2022   RDW 15.0 11/25/2022   LYMPHSABS 1.1 11/22/2022   MONOABS 0.7 11/22/2022   EOSABS 0.1 11/22/2022   BASOSABS 0.1 11/22/2022     Last metabolic panel Lab Results  Component Value Date   NA 137 11/25/2022   K 4.0 11/25/2022   CL 102 11/25/2022   CO2 24 11/25/2022   BUN 17 11/25/2022   CREATININE 0.69 11/25/2022   GLUCOSE 147 (H) 11/25/2022   GFRNONAA >60 11/25/2022   GFRAA >60 06/06/2015   CALCIUM 8.7 (L) 11/25/2022   PHOS 3.6 09/17/2022   PROT 6.4 (L) 09/18/2022   ALBUMIN 3.6 09/18/2022   BILITOT 0.7 09/18/2022   ALKPHOS 55 09/18/2022   AST 39 09/18/2022   ALT 31 09/18/2022   ANIONGAP 11 11/25/2022    CBG (last 3)  Recent Labs    11/25/22 0356 11/25/22 0726 11/25/22 0928  GLUCAP 127* 144* 106*      Coagulation Profile: No results for input(s): "INR", "PROTIME" in the last 168 hours.   Radiology Studies: No results found.     Kathlen Mody M.D. [facility] 11/25/2022, 10:51 AM  Available via Epic secure chat 7am-7pm After 7 pm, please refer to night coverage provider listed on amion.

## 2022-11-25 NOTE — Anesthesia Procedure Notes (Signed)
Spinal  Patient location during procedure: OR Start time: 11/25/2022 10:24 AM End time: 11/25/2022 10:34 AM Reason for block: surgical anesthesia Staffing Performed: anesthesiologist  Anesthesiologist: Heather Roberts, MD Performed by: Heather Roberts, MD Authorized by: Heather Roberts, MD   Preanesthetic Checklist Completed: patient identified, IV checked, risks and benefits discussed, surgical consent, monitors and equipment checked, pre-op evaluation and timeout performed Spinal Block Patient position: right lateral decubitus Prep: DuraPrep Patient monitoring: cardiac monitor, continuous pulse ox and blood pressure Approach: midline Location: L2-3 Injection technique: single-shot Needle Needle type: Pencan  Needle gauge: 24 G Needle length: 9 cm Assessment Events: CSF return Additional Notes Functioning IV was confirmed and monitors were applied. Sterile prep and drape, including hand hygiene and sterile gloves were used. The patient was positioned and the spine was prepped. The skin was anesthetized with lidocaine.  Free flow of clear CSF was obtained prior to injecting local anesthetic into the CSF.  The spinal needle aspirated freely following injection.  The needle was carefully withdrawn.  The patient tolerated the procedure well.

## 2022-11-25 NOTE — Interval H&P Note (Signed)
History and Physical Interval Note:  11/25/2022 10:20 AM  Brenda Pham  has presented today for surgery, with the diagnosis of RIGHT INTERTROCHANTERIC FRACTURE.  The various methods of treatment have been discussed with the patient and family. After consideration of risks, benefits and other options for treatment, the patient has consented to  Procedure(s): INTRAMEDULLARY (IM) NAIL INTERTROCHANTERIC (Right) as a surgical intervention.  The patient's history has been reviewed, patient examined, no change in status, stable for surgery.  I have reviewed the patient's chart and labs.  Questions were answered to the patient's satisfaction.     Iline Oven Reilynn Lauro

## 2022-11-26 DIAGNOSIS — E785 Hyperlipidemia, unspecified: Secondary | ICD-10-CM | POA: Diagnosis not present

## 2022-11-26 DIAGNOSIS — D649 Anemia, unspecified: Secondary | ICD-10-CM | POA: Diagnosis not present

## 2022-11-26 DIAGNOSIS — S72001D Fracture of unspecified part of neck of right femur, subsequent encounter for closed fracture with routine healing: Secondary | ICD-10-CM | POA: Diagnosis not present

## 2022-11-26 DIAGNOSIS — E119 Type 2 diabetes mellitus without complications: Secondary | ICD-10-CM | POA: Diagnosis not present

## 2022-11-26 LAB — GLUCOSE, CAPILLARY
Glucose-Capillary: 159 mg/dL — ABNORMAL HIGH (ref 70–99)
Glucose-Capillary: 151 mg/dL — ABNORMAL HIGH (ref 70–99)
Glucose-Capillary: 140 mg/dL — ABNORMAL HIGH (ref 70–99)
Glucose-Capillary: 128 mg/dL — ABNORMAL HIGH (ref 70–99)
Glucose-Capillary: 141 mg/dL — ABNORMAL HIGH (ref 70–99)
Glucose-Capillary: 145 mg/dL — ABNORMAL HIGH (ref 70–99)

## 2022-11-26 LAB — CBC
HCT: 30 % — ABNORMAL LOW (ref 36.0–46.0)
Hemoglobin: 9.3 g/dL — ABNORMAL LOW (ref 12.0–15.0)
MCH: 26.3 pg (ref 26.0–34.0)
MCHC: 31 g/dL (ref 30.0–36.0)
MCV: 85 fL (ref 80.0–100.0)
Platelets: 186 10*3/uL (ref 150–400)
RBC: 3.53 MIL/uL — ABNORMAL LOW (ref 3.87–5.11)
RDW: 14.8 % (ref 11.5–15.5)
WBC: 9.9 10*3/uL (ref 4.0–10.5)
nRBC: 0 % (ref 0.0–0.2)

## 2022-11-26 LAB — BASIC METABOLIC PANEL
Anion gap: 9 (ref 5–15)
BUN: 17 mg/dL (ref 8–23)
CO2: 23 mmol/L (ref 22–32)
Calcium: 8 mg/dL — ABNORMAL LOW (ref 8.9–10.3)
Chloride: 105 mmol/L (ref 98–111)
Creatinine, Ser: 0.6 mg/dL (ref 0.44–1.00)
GFR, Estimated: >60 mL/min (ref >60)
Glucose, Bld: 145 mg/dL — ABNORMAL HIGH (ref 70–99)
Potassium: 3.8 mmol/L (ref 3.5–5.1)
Sodium: 137 mmol/L (ref 135–145)

## 2022-11-26 MED ORDER — LACTULOSE 10 GM/15ML PO SOLN
10.0000 g | ORAL | Status: DC
  Administered 2022-11-26 – 2022-11-28 (×2): 10 g via ORAL
  Filled 2022-11-26 (×2): qty 15

## 2022-11-26 MED ORDER — POTASSIUM CHLORIDE ER 10 MEQ PO TBCR
10.0000 meq | EXTENDED_RELEASE_TABLET | Freq: Every day | ORAL | Status: DC
  Administered 2022-11-27 – 2022-11-28 (×2): 10 meq via ORAL
  Filled 2022-11-26 (×4): qty 1

## 2022-11-26 MED ORDER — POLYETHYLENE GLYCOL 3350 17 G PO PACK: 17.0000 g | PACK | Freq: Every day | ORAL | Status: DC | PRN

## 2022-11-26 MED ORDER — HALOPERIDOL LACTATE 5 MG/ML IJ SOLN
1.0000 mg | Freq: Once | INTRAMUSCULAR | Status: AC
  Administered 2022-11-26: 1 mg via INTRAVENOUS

## 2022-11-26 MED ORDER — ENOXAPARIN SODIUM 40 MG/0.4ML IJ SOSY
40.0000 mg | PREFILLED_SYRINGE | INTRAMUSCULAR | Status: DC
  Administered 2022-11-27 – 2022-11-28 (×2): 40 mg via SUBCUTANEOUS
  Filled 2022-11-26 (×2): qty 0.4

## 2022-11-26 MED ORDER — SENNOSIDES-DOCUSATE SODIUM 8.6-50 MG PO TABS
2.0000 | ORAL_TABLET | Freq: Two times a day (BID) | ORAL | Status: DC
  Administered 2022-11-26 – 2022-11-28 (×3): 2 via ORAL
  Filled 2022-11-26 (×5): qty 2

## 2022-11-26 MED ORDER — THIAMINE MONONITRATE 100 MG PO TABS
100.0000 mg | ORAL_TABLET | Freq: Every day | ORAL | Status: DC
  Administered 2022-11-26 – 2022-11-28 (×3): 100 mg via ORAL
  Filled 2022-11-26 (×3): qty 1

## 2022-11-26 MED ORDER — LORAZEPAM 0.5 MG PO TABS: 0.5000 mg | ORAL_TABLET | Freq: Three times a day (TID) | ORAL | Status: DC | PRN

## 2022-11-26 MED ORDER — HALOPERIDOL LACTATE 5 MG/ML IJ SOLN
INTRAMUSCULAR | Status: AC
  Filled 2022-11-26: qty 1

## 2022-11-26 MED ORDER — MELATONIN 3 MG PO TABS
3.0000 mg | ORAL_TABLET | Freq: Every day | ORAL | Status: DC
  Administered 2022-11-27: 3 mg via ORAL
  Filled 2022-11-26 (×2): qty 1

## 2022-11-26 MED ORDER — LORAZEPAM 0.5 MG PO TABS: 0.2500 mg | ORAL_TABLET | Freq: Three times a day (TID) | ORAL | Status: DC | PRN

## 2022-11-26 NOTE — Plan of Care (Signed)
Patient became agitated at 10 pm and fought the process of assessment and obtaining of vital signs, triggering a Yellow MEWS accidentally.  VS retaken manually.   Refused PO fluids and meds.    Received order for 1 mg Haldol IV this shift when Patient became agitated and would not leave O2 canula on.  Restful and somewhat more accepting of care this shift since then.

## 2022-11-26 NOTE — Progress Notes (Signed)
1300 Staff x 2 attempted to transfer patient to [facility]. She stood with great difficulty and was unable to pivot. Severe weakness. Knees began to bend and patient had to sit back down on bed. Assisted x 2 to get back into bed and bed pan utilized. Haydee Salter, RN 11/26/22 1:18 PM

## 2022-11-26 NOTE — Progress Notes (Signed)
[facility]                                                                               Brenda Pham, is a 82 y.o. female, DOB - 1940/06/17, BMW:[id number] Admit date - 11/22/2022    Outpatient Primary MD for the patient is Eloisa Northern, MD  LOS - 4  days    Brief summary   82 y.o. female with medical history significant of diet-controlled type 2 diabetes, hyperlipidemia, history of breast cancer, osteoporosis, B12 deficiency.  She was evaluated in the ED earlier today after a mechanical fall.  CT head/C-spine and x-ray of right knee were negative for acute findings.  She was discharged back to her facility.  Patient returns to the ED this evening complaining of right hip pain.  X-ray of right femur showing acute proximal femoral intertrochanteric fracture.  Chest x-ray showing no acute cardiopulmonary disease.  Assessment & Plan    Assessment and Plan:  Acute proximal femoral intertrochanteric fracture secondary to mechanical fall Orthopedics consulted and  she underwent  Intramedullary fixation of the Right femur.  Pain control with tylenol, vicodin.    Unilateral right lower extremity edema of unclear etiology. Doppler ultrasound ordered and negative for DVT.   Mild anemia of chronic disease/ acute anemia of blood loss from OR Monitor.      Diet controlled diabetes mellitus Last A1c 6.1 Continue sliding scale insulin. CBG (last 3)  Recent Labs    11/26/22 0423 11/26/22 0729 11/26/22 1200  GLUCAP 159* 151* 140*   No changes in meds.    Hyperlipidemia   Post op delirium:  Resolved with a dose of haldol.    Constipation:  - started the patient on lactulose along with senna , colace and miralax.    Estimated body mass index is 24.92 kg/m as calculated from the following:   Height as of this encounter: 5\' 3"  (1.6 m).   Weight as of this encounter: 63.8 kg.  Code Status: DNR DVT Prophylaxis:  enoxaparin (LOVENOX) injection 40 mg  Start: 11/27/22 0800 SCDs Start: 11/25/22 1330   Level of Care: Level of care: Med-Surg Family Communication: Updated patient's family at bedside.   Disposition Plan:     Remains inpatient appropriate:  pending therapy evaluations.   Procedures:  Surgical repair of the RLL  Consultants:   Orthopedics.   Antimicrobials:   Anti-infectives (From admission, onward)    Start     Dose/Rate Route Frequency Ordered Stop   11/25/22 1630  ceFAZolin (ANCEF) IVPB 2g/100 mL premix        2 g 200 mL/hr over 30 Minutes Intravenous Every 6 hours 11/25/22 1330 11/26/22 0712   11/25/22 0925  ceFAZolin (ANCEF) 2-4 GM/100ML-% IVPB       Note to Pharmacy: Vevelyn Royals D: cabinet override      11/25/22 0925 11/25/22 1424        Medications  Scheduled Meds:  (feeding supplement) PROSource Plus  30 mL Oral BID BM   docusate sodium  100 mg Oral BID   [START ON 11/27/2022] enoxaparin (LOVENOX) injection  40 mg Subcutaneous Q24H   insulin aspart  0-9 Units Subcutaneous Q4H  lactulose  10 g Oral UD   melatonin  3 mg Oral QHS   [START ON 11/27/2022] potassium chloride  10 mEq Oral q AM   thiamine  100 mg Oral Daily   Continuous Infusions:  methocarbamol (ROBAXIN) IV     PRN Meds:.acetaminophen, HYDROcodone-acetaminophen, HYDROcodone-acetaminophen, LORazepam, menthol-cetylpyridinium **OR** phenol, methocarbamol (ROBAXIN) IV, metoCLOPramide **OR** metoCLOPramide (REGLAN) injection, naLOXone (NARCAN)  injection, ondansetron **OR** ondansetron (ZOFRAN) IV, oxyCODONE-acetaminophen    Subjective:   Brenda Pham was seen and examined today.  Agitated overnight and was givena dose of haldol.  This morning she is pleasant.  Objective:   Vitals:   11/26/22 0159 11/26/22 0202 11/26/22 0429 11/26/22 0930  BP: 118/60  130/70 121/68  Pulse: 97   (!) 104  Resp: 16  19 18   Temp: (!) 97.5 F (36.4 C)  98.3 F (36.8 C) 99.3 F (37.4 C)  TempSrc:      SpO2: (!) 86% 94% 95% 90%  Weight:       Height:        Intake/Output Summary (Last 24 hours) at 11/26/2022 1208 Last data filed at 11/26/2022 1610 Gross per 24 hour  Intake 2131.22 ml  Output 1000 ml  Net 1131.22 ml   Filed Weights   11/24/22 1940  Weight: 63.8 kg     Exam General exam: Appears calm and comfortable  Respiratory system: Clear to auscultation. Respiratory effort normal. Cardiovascular system: S1 & S2 heard, RRR. No JVD, Gastrointestinal system: Abdomen is nondistended, soft and nontender.  Central nervous system: Alert and oriented to person only, pleasantly confused.  Extremities: no edema.  Skin: No rashes,  Psychiatry: confused.      Data Reviewed:  I have personally reviewed following labs and imaging studies   CBC Lab Results  Component Value Date   WBC 9.9 11/26/2022   RBC 3.53 (L) 11/26/2022   HGB 9.3 (L) 11/26/2022   HCT 30.0 (L) 11/26/2022   MCV 85.0 11/26/2022   MCH 26.3 11/26/2022   PLT 186 11/26/2022   MCHC 31.0 11/26/2022   RDW 14.8 11/26/2022   LYMPHSABS 1.1 11/22/2022   MONOABS 0.7 11/22/2022   EOSABS 0.1 11/22/2022   BASOSABS 0.1 11/22/2022     Last metabolic panel Lab Results  Component Value Date   NA 137 11/26/2022   K 3.8 11/26/2022   CL 105 11/26/2022   CO2 23 11/26/2022   BUN 17 11/26/2022   CREATININE 0.60 11/26/2022   GLUCOSE 145 (H) 11/26/2022   GFRNONAA >60 11/26/2022   GFRAA >60 06/06/2015   CALCIUM 8.0 (L) 11/26/2022   PHOS 3.6 09/17/2022   PROT 6.4 (L) 09/18/2022   ALBUMIN 3.6 09/18/2022   BILITOT 0.7 09/18/2022   ALKPHOS 55 09/18/2022   AST 39 09/18/2022   ALT 31 09/18/2022   ANIONGAP 9 11/26/2022    CBG (last 3)  Recent Labs    11/26/22 0423 11/26/22 0729 11/26/22 1200  GLUCAP 159* 151* 140*      Coagulation Profile: No results for input(s): "INR", "PROTIME" in the last 168 hours.   Radiology Studies: Pelvis Portable  Result Date: 11/25/2022 CLINICAL DATA:  [id number] Closed comminuted intertrochanteric fracture of  proximal end of right femur [facility]) [id number] EXAM: PORTABLE PELVIS 1-2 VIEWS COMPARISON:  Preoperative imaging FINDINGS: Intramedullary nail with trans trochanteric and distal locking screw fixation of intertrochanteric femur fracture. There is displacement of the fracture fragment involving the lesser trochanter. Recent postsurgical change includes air and edema in the soft tissues. Skin staples in place.  IMPRESSION: ORIF of intertrochanteric femur fracture. Electronically Signed   By: Narda Rutherford M.D.   On: 11/25/2022 12:58   DG HIP UNILAT WITH PELVIS 1V RIGHT  Result Date: 11/25/2022 CLINICAL DATA:  Elective surgery. EXAM: DG HIP (WITH OR WITHOUT PELVIS) 1V RIGHT COMPARISON:  Preoperative imaging. FINDINGS: Three fluoroscopic spot views of the right hip obtained in the operating room. Intramedullary nail with trans trochanteric and distal locking screw fixation traverse intertrochanteric femur fracture. Fluoroscopy time 59 seconds. Dose 7.8 mGy. IMPRESSION: Intraoperative fluoroscopy during ORIF of right intertrochanteric femur fracture. Electronically Signed   By: Narda Rutherford M.D.   On: 11/25/2022 12:56   DG C-Arm 1-60 Min-No Report  Result Date: 11/25/2022 Fluoroscopy was utilized by the requesting physician.  No radiographic interpretation.   DG C-Arm 1-60 Min-No Report  Result Date: 11/25/2022 Fluoroscopy was utilized by the requesting physician.  No radiographic interpretation.       Kathlen Mody M.D. [facility] 11/26/2022, 12:08 PM  Available via Epic secure chat 7am-7pm After 7 pm, please refer to night coverage provider listed on amion.

## 2022-11-26 NOTE — Progress Notes (Signed)
    Patient Name: VINCIE THEROUX           DOB: Mar 10, 1941  MRN: [id number]      Admission Date: 11/22/2022  Attending Provider: Kathlen Mody, MD  Primary Diagnosis: Hip fracture [facility])   Level of care: Med-Surg    CROSS COVER NOTE   Date of Service   11/26/2022   RAPHAELLA HASKIN, 82 y.o. female, was admitted on 11/22/2022 for Hip fracture [facility]).    HPI/Events of Note   Agitated, restless, and confused PMHx of advanced dementia, disoriented at baseline.  Currently removing medical equipment, screaming, and refusing oral medication.  Pain well-managed after IV morphine. Unfortunately, she is not following commands despite multiple attempts at redirection by staff.    Interventions/ Plan   Will trial IV Haldol for agitation.         Anthoney Harada, DNP, ACNPC- AG [facility]

## 2022-11-26 NOTE — Evaluation (Signed)
Physical Therapy Evaluation Patient Details Name: Brenda Pham MRN: [id number] DOB: 03/04/1941 Today's Date: 11/26/2022  History of Present Illness  Brenda Pham is a 82 y.o. female admitted om7/23 after a fall resulting in R proximal femur fx. Underwent IM nail on 7/26.  PMH: significant for osteoarthritis, breast cancer, type 2 diabetes, hyperlipidemia,, internal hemorrhoids, colon polyps, left foot diabetic ulcer, dementia, osteoporosis.  Clinical Impression  On eval, pt required Max-Total A +2 for bed mobility and Mod A +2 for standing at bedside with RW. Pt followed 1 step commands with increased time. She was cooperative throughout session although a bit fearful and anxious. Mobility is limited by R LE pain. She was not able to take any steps on today. Pt presents with general weakness, decreased activity tolerance, and impaired gait and balance. No family present during session. Patient will benefit from continued inpatient follow up therapy, <3 hours/day.       Assistance Recommended at Discharge Frequent or constant Supervision/Assistance  If plan is discharge home, recommend the following:  Can travel by private vehicle  Two people to help with walking and/or transfers;A lot of help with bathing/dressing/bathroom;Assistance with cooking/housework;Assist for transportation;Help with stairs or ramp for entrance   No    Equipment Recommendations  (TBD at next venue)  Recommendations for Other Services       Functional Status Assessment Patient has had a recent decline in their functional status and demonstrates the ability to make significant improvements in function in a reasonable and predictable amount of time.     Precautions / Restrictions Precautions Precautions: Fall Restrictions Weight Bearing Restrictions: No RLE Weight Bearing: Weight bearing as tolerated      Mobility  Bed Mobility Overal bed mobility: Needs Assistance Bed Mobility: Supine to  Sit, Sit to Supine     Supine to sit: Max assist, +2 for physical assistance, +2 for safety/equipment, HOB elevated Sit to supine: +2 for physical assistance, Total assist' +2 safety/equipment   General bed mobility comments: verbal cues for technique, pt able to advance L LE toward EOB, assist for R LE, to raise trunk and for hips to EOB, assist for all aspects to return to supine    Transfers Overall transfer level: Needs assistance Equipment used: Rolling walker (2 wheels) Transfers: Sit to/from Stand Sit to Stand: +2 physical assistance, Mod assist; +2 safety/equipment           General transfer comment: assist to rise and steady from elevated bed. Multimodal cueing and increased time required. Unable to take any lateral steps-Poor ability to weightshift although she was able to slighly slide R LE laterally    Ambulation/Gait                  Stairs            Wheelchair Mobility     Tilt Bed    Modified Rankin (Stroke Patients Only)       Balance Overall balance assessment: Needs assistance, History of Falls         Standing balance support: Bilateral upper extremity supported, During functional activity Standing balance-Leahy Scale: Poor Standing balance comment: moderate assistance for static standing                             Pertinent Vitals/Pain Pain Assessment Pain Assessment: Faces Faces Pain Scale: Hurts even more Pain Location: R hip with activity Pain Descriptors / Indicators: Grimacing, Guarding, Moaning Pain  Intervention(s): Limited activity within patient's tolerance, Monitored during session, Repositioned    Home Living Family/patient expects to be discharged to:: Unsure     Type of Home: Assisted living Home Access: Level entry       Home Layout: One level Home Equipment: Standard Walker;Cane - single point;Shower seat Additional Comments: Pt is a resident of [facility] memory care. PLOF from prior chart  entry as pt is a poor historian    Prior Function Prior Level of Function : Needs assist             Mobility Comments: pt reports using a RW ADLs Comments: assisted PRN with ADLs in memory care facility     Hand Dominance   Dominant Hand: Right    Extremity/Trunk Assessment   Upper Extremity Assessment Upper Extremity Assessment: Defer to OT evaluation    Lower Extremity Assessment Lower Extremity Assessment: Generalized weakness    Cervical / Trunk Assessment Cervical / Trunk Assessment: Kyphotic  Communication   Communication: HOH  Cognition Arousal/Alertness: Awake/alert Behavior During Therapy: Flat affect Overall Cognitive Status: History of cognitive impairments - at baseline                                 General Comments: pt with h/o dementia, disoriented except to self, follows simple commands with increased time        General Comments      Exercises     Assessment/Plan    PT Assessment Patient needs continued PT services  PT Problem List Decreased strength;Decreased range of motion;Decreased activity tolerance;Decreased balance;Decreased mobility;Decreased knowledge of use of DME;Pain       PT Treatment Interventions DME instruction;Therapeutic exercise;Gait training;Functional mobility training;Therapeutic activities;Patient/family education;Balance training    PT Goals (Current goals can be found in the Care Plan section)  Acute Rehab PT Goals Patient Stated Goal: nont stated by patient PT Goal Formulation: Patient unable to participate in goal setting Time For Goal Achievement: 12/10/22 Potential to Achieve Goals: Good    Frequency Min 1X/week     Co-evaluation   Reason for Co-Treatment: For patient/therapist safety;Necessary to address cognition/behavior during functional activity   OT goals addressed during session: ADL's and self-care;Strengthening/ROM       AM-PAC PT "6 Clicks" Mobility  Outcome Measure Help  needed turning from your back to your side while in a flat bed without using bedrails?: A Lot Help needed moving from lying on your back to sitting on the side of a flat bed without using bedrails?: A Lot Help needed moving to and from a bed to a chair (including a wheelchair)?: Total Help needed standing up from a chair using your arms (e.g., wheelchair or bedside chair)?: Total Help needed to walk in hospital room?: Total Help needed climbing 3-5 steps with a railing? : Total 6 Click Score: 8    End of Session Equipment Utilized During Treatment: Gait belt Activity Tolerance: Patient limited by pain (limited by cognition) Patient left: in bed;with call bell/phone within reach;with bed alarm set Nurse Communication: Need for lift equipment;Mobility status PT Visit Diagnosis: History of falling (Z91.81);Difficulty in walking, not elsewhere classified (R26.2);Pain;Muscle weakness (generalized) (M62.81) Pain - Right/Left: Right Pain - part of body: Leg    Time: 1610-9604 PT Time Calculation (min) (ACUTE ONLY): 24 min   Charges:   PT Evaluation $PT Eval Low Complexity: 1 Low   PT General Charges $$ ACUTE PT VISIT: 1 Visit  Faye Ramsay, PT Acute Rehabilitation  Office: [phone]

## 2022-11-26 NOTE — Evaluation (Signed)
Occupational Therapy Evaluation Patient Details Name: Brenda Pham MRN: [id number] DOB: 1940-11-04 Today's Date: 11/26/2022   History of Present Illness Brenda Pham is a 82 y.o. female admitted om7/23 after a fall resulting in R proximal femur fx. Underwent IM nail on 7/25.  PMH: significant for osteoarthritis, breast cancer, type 2 diabetes, hyperlipidemia,, internal hemorrhoids, colon polyps, left foot diabetic ulcer, dementia, osteoporosis.   Clinical Impression   Pt is a resident of [facility] facility. She is unable to offer PLOF, information gathered from prior admission. Pt reports walking with a RW and appeared to be familiar. She currently requires +2 max to total assist for bed mobility. She was able to stand from elevated bed with +2 mod assist and maintain with mod assist statically. She was unable to take steps. Pt requires min to total assist for ADLs. Patient will benefit from continued inpatient follow up therapy, <3 hours/day.      Recommendations for follow up therapy are one component of a multi-disciplinary discharge planning process, led by the attending physician.  Recommendations may be updated based on patient status, additional functional criteria and insurance authorization.   Assistance Recommended at Discharge Frequent or constant Supervision/Assistance  Patient can return home with the following Two people to help with walking and/or transfers;A lot of help with bathing/dressing/bathroom;Assistance with cooking/housework;Direct supervision/assist for medications management;Direct supervision/assist for financial management;Assist for transportation;Help with stairs or ramp for entrance    Functional Status Assessment  Patient has had a recent decline in their functional status and/or demonstrates limited ability to make significant improvements in function in a reasonable and predictable amount of time  Equipment Recommendations  Other  (comment) (defer to SNF)    Recommendations for Other Services       Precautions / Restrictions Precautions Precautions: Fall Restrictions Weight Bearing Restrictions: Yes RLE Weight Bearing: Weight bearing as tolerated      Mobility Bed Mobility Overal bed mobility: Needs Assistance Bed Mobility: Supine to Sit, Sit to Supine     Supine to sit: +2 for physical assistance, Max assist Sit to supine: +2 for physical assistance, Total assist   General bed mobility comments: verbal cues for technique, pt able to advance L LE toward EOB, assist for R LE, to raise trunk and for hips to EOB, assist for all aspects to return to supine    Transfers Overall transfer level: Needs assistance Equipment used: Rolling walker (2 wheels) Transfers: Sit to/from Stand Sit to Stand: +2 physical assistance, Mod assist           General transfer comment: assist to rise and steady from elevated bed      Balance Overall balance assessment: Needs assistance   Sitting balance-Leahy Scale: Poor   Postural control: Posterior lean   Standing balance-Leahy Scale: Poor Standing balance comment: moderate assistance for static standing                           ADL either performed or assessed with clinical judgement   ADL Overall ADL's : Needs assistance/impaired Eating/Feeding: Set up;Sitting;Bed level   Grooming: Minimal assistance;Sitting   Upper Body Bathing: Moderate assistance;Sitting   Lower Body Bathing: Total assistance;Sit to/from stand;+2 for physical assistance   Upper Body Dressing : Sitting;Moderate assistance   Lower Body Dressing: Total assistance;+2 for physical assistance;Sit to/from stand       Toileting- Architect and Hygiene: Total assistance;Bed level;+2 for physical assistance  Vision Ability to See in Adequate Light: 0 Adequate Patient Visual Report: No change from baseline       Perception     Praxis       Pertinent Vitals/Pain Pain Assessment Pain Assessment: Faces Faces Pain Scale: Hurts little more Pain Location: R hip Pain Descriptors / Indicators: Grimacing, Guarding, Moaning Pain Intervention(s): Repositioned, Monitored during session, Limited activity within patient's tolerance     Hand Dominance Right   Extremity/Trunk Assessment Upper Extremity Assessment Upper Extremity Assessment: Generalized weakness   Lower Extremity Assessment Lower Extremity Assessment: Defer to PT evaluation       Communication Communication Communication: HOH   Cognition Arousal/Alertness: Awake/alert Behavior During Therapy: Flat affect Overall Cognitive Status: History of cognitive impairments - at baseline                                 General Comments: pt with h/o dementia, disoriented except to self, follows simple commands with increased time     General Comments       Exercises     Shoulder Instructions      Home Living Family/patient expects to be discharged to:: Assisted living                             Home Equipment: Standard Walker;Cane - single point;Shower seat   Additional Comments: Pt is a resident of [facility] memory care. PLOF from prior chart entry as pt is a poor historian      Prior Functioning/Environment Prior Level of Function : Needs assist             Mobility Comments: pt reports using a RW ADLs Comments: assisted PRN with ADLs in memory care facility        OT Problem List: Decreased strength;Decreased activity tolerance;Impaired balance (sitting and/or standing);Decreased cognition;Decreased knowledge of use of DME or AE;Pain      OT Treatment/Interventions: Self-care/ADL training;Therapeutic activities;Patient/family education;Balance training    OT Goals(Current goals can be found in the care plan section) Acute Rehab OT Goals OT Goal Formulation: Patient unable to participate in goal setting Time For  Goal Achievement: 12/10/22 Potential to Achieve Goals: Fair ADL Goals Pt Will Perform Grooming: with supervision;sitting Pt Will Perform Upper Body Dressing: with min assist;sitting Pt Will Transfer to Toilet: with mod assist;stand pivot transfer;bedside commode Additional ADL Goal #1: Pt will perform bed mobility with moderate assistance in preparation for ADLs. Additional ADL Goal #2: Pt will demonstrate fair sitting balance at EOB while participating in ADLs.  OT Frequency: Min 1X/week    Co-evaluation PT/OT/SLP Co-Evaluation/Treatment: Yes Reason for Co-Treatment: For patient/therapist safety;Necessary to address cognition/behavior during functional activity   OT goals addressed during session: ADL's and self-care;Strengthening/ROM      AM-PAC OT "6 Clicks" Daily Activity     Outcome Measure Help from another person eating meals?: A Little Help from another person taking care of personal grooming?: A Little Help from another person toileting, which includes using toliet, bedpan, or urinal?: Total Help from another person bathing (including washing, rinsing, drying)?: A Lot Help from another person to put on and taking off regular upper body clothing?: A Lot Help from another person to put on and taking off regular lower body clothing?: Total 6 Click Score: 12   End of Session Equipment Utilized During Treatment: Gait belt;Rolling walker (2 wheels) Nurse Communication: Need for lift equipment  Activity Tolerance: Patient tolerated treatment well Patient left: in bed;with call bell/phone within reach;with bed alarm set  OT Visit Diagnosis: Unsteadiness on feet (R26.81);Pain;Other symptoms and signs involving cognitive function;Muscle weakness (generalized) (M62.81)                Time: 2956-2130 OT Time Calculation (min): 24 min Charges:  OT General Charges $OT Visit: 1 Visit OT Evaluation $OT Eval Moderate Complexity: 1 Mod  Berna Spare, OTR/L Acute Rehabilitation  Services Office: [phone]   Evern Bio 11/26/2022, 1:05 PM

## 2022-11-27 ENCOUNTER — Encounter (HOSPITAL_COMMUNITY): Payer: Self-pay | Admitting: Orthopedic Surgery

## 2022-11-27 DIAGNOSIS — E119 Type 2 diabetes mellitus without complications: Secondary | ICD-10-CM | POA: Diagnosis not present

## 2022-11-27 DIAGNOSIS — S72001D Fracture of unspecified part of neck of right femur, subsequent encounter for closed fracture with routine healing: Secondary | ICD-10-CM | POA: Diagnosis not present

## 2022-11-27 DIAGNOSIS — E785 Hyperlipidemia, unspecified: Secondary | ICD-10-CM | POA: Diagnosis not present

## 2022-11-27 DIAGNOSIS — D649 Anemia, unspecified: Secondary | ICD-10-CM | POA: Diagnosis not present

## 2022-11-27 LAB — GLUCOSE, CAPILLARY
Glucose-Capillary: 100 mg/dL — ABNORMAL HIGH (ref 70–99)
Glucose-Capillary: 100 mg/dL — ABNORMAL HIGH (ref 70–99)
Glucose-Capillary: 123 mg/dL — ABNORMAL HIGH (ref 70–99)
Glucose-Capillary: 141 mg/dL — ABNORMAL HIGH (ref 70–99)
Glucose-Capillary: 95 mg/dL (ref 70–99)
Glucose-Capillary: 96 mg/dL (ref 70–99)

## 2022-11-27 LAB — CBC
HCT: 30.2 % — ABNORMAL LOW (ref 36.0–46.0)
Hemoglobin: 9.4 g/dL — ABNORMAL LOW (ref 12.0–15.0)
MCH: 26.3 pg (ref 26.0–34.0)
MCHC: 31.1 g/dL (ref 30.0–36.0)
MCV: 84.4 fL (ref 80.0–100.0)
Platelets: 163 10*3/uL (ref 150–400)
RBC: 3.58 MIL/uL — ABNORMAL LOW (ref 3.87–5.11)
RDW: 15 % (ref 11.5–15.5)
WBC: 6.9 10*3/uL (ref 4.0–10.5)
nRBC: 0 % (ref 0.0–0.2)

## 2022-11-27 LAB — BASIC METABOLIC PANEL WITH GFR
Anion gap: 7 (ref 5–15)
BUN: 17 mg/dL (ref 8–23)
CO2: 23 mmol/L (ref 22–32)
Calcium: 7.9 mg/dL — ABNORMAL LOW (ref 8.9–10.3)
Chloride: 105 mmol/L (ref 98–111)
Creatinine, Ser: 0.65 mg/dL (ref 0.44–1.00)
GFR, Estimated: >60 mL/min (ref >60)
Glucose, Bld: 112 mg/dL — ABNORMAL HIGH (ref 70–99)
Potassium: 3.8 mmol/L (ref 3.5–5.1)
Sodium: 135 mmol/L (ref 135–145)

## 2022-11-27 NOTE — Plan of Care (Signed)

## 2022-11-27 NOTE — Progress Notes (Signed)
[facility]                                                                               Zhariyah Barrile, is a 82 y.o. female, DOB - 1941-04-11, ZOX:[id number] Admit date - 11/22/2022    Outpatient Primary MD for the patient is Eloisa Northern, MD  LOS - 5  days    Brief summary   82 y.o. female with medical history significant of diet-controlled type 2 diabetes, hyperlipidemia, history of breast cancer, osteoporosis, B12 deficiency.  She was evaluated in the ED earlier today after a mechanical fall.  CT head/C-spine and x-ray of right knee were negative for acute findings.  She was discharged back to her facility.  Patient returns to the ED this evening complaining of right hip pain.  X-ray of right femur showing acute proximal femoral intertrochanteric fracture.  Chest x-ray showing no acute cardiopulmonary disease.  Assessment & Plan    Assessment and Plan:  Acute proximal femoral intertrochanteric fracture secondary to mechanical fall Orthopedics consulted and  she underwent  Intramedullary fixation of the Right femur.  Pain control with tylenol, vicodin.  Therapy eval recommending Snf.    Unilateral right lower extremity edema of unclear etiology. Doppler ultrasound ordered and negative for DVT.   Mild anemia of chronic disease/ acute anemia of blood loss from OR Monitor.  Hemoglobin stabilized at 9.     Diet controlled diabetes mellitus Last A1c 6.1 Continue sliding scale insulin. CBG (last 3)  Recent Labs    11/27/22 0336 11/27/22 0744 11/27/22 1129  GLUCAP 100* 123* 100*   No changes in meds.    Hyperlipidemia   Post op delirium:  Resolved with a dose of haldol.    Constipation:  - started the patient on lactulose along with senna , colace and miralax.    Estimated body mass index is 24.92 kg/m as calculated from the following:   Height as of this encounter: 5\' 3"  (1.6 m).   Weight as of this encounter: 63.8 kg.  Code Status:  DNR DVT Prophylaxis:  enoxaparin (LOVENOX) injection 40 mg Start: 11/27/22 0800 SCDs Start: 11/25/22 1330   Level of Care: Level of care: Med-Surg Family Communication: Updated patient's family at bedside.   Disposition Plan:     Remains inpatient appropriate:  possible d/c in the next 24 hours.    Procedures:  Surgical repair of the RLL  Consultants:   Orthopedics.   Antimicrobials:   Anti-infectives (From admission, onward)    Start     Dose/Rate Route Frequency Ordered Stop   11/25/22 1630  ceFAZolin (ANCEF) IVPB 2g/100 mL premix        2 g 200 mL/hr over 30 Minutes Intravenous Every 6 hours 11/25/22 1330 11/26/22 0712   11/25/22 0925  ceFAZolin (ANCEF) 2-4 GM/100ML-% IVPB       Note to Pharmacy: Vevelyn Royals D: cabinet override      11/25/22 0925 11/25/22 1424        Medications  Scheduled Meds:  (feeding supplement) PROSource Plus  30 mL Oral BID BM   enoxaparin (LOVENOX) injection  40 mg Subcutaneous Q24H   insulin aspart  0-9 Units Subcutaneous  Q4H   lactulose  10 g Oral QODAY   melatonin  3 mg Oral QHS   potassium chloride  10 mEq Oral Daily   senna-docusate  2 tablet Oral BID   thiamine  100 mg Oral Daily   Continuous Infusions:  methocarbamol (ROBAXIN) IV Stopped (11/27/22 0203)   PRN Meds:.acetaminophen, HYDROcodone-acetaminophen, HYDROcodone-acetaminophen, LORazepam, menthol-cetylpyridinium **OR** phenol, methocarbamol (ROBAXIN) IV, metoCLOPramide **OR** metoCLOPramide (REGLAN) injection, naLOXone (NARCAN)  injection, ondansetron **OR** ondansetron (ZOFRAN) IV, oxyCODONE-acetaminophen, polyethylene glycol    Subjective:   Kerrilee Moynahan was seen and examined today. No new complaints.  Objective:   Vitals:   11/26/22 1348 11/26/22 2247 11/27/22 0133 11/27/22 0601  BP: 127/72 139/63  133/76  Pulse: 99 (!) 110 81 96  Resp: 16 17 16 18   Temp: 98.4 F (36.9 C)     TempSrc:      SpO2: 91% 91% 92% 94%  Weight:      Height:         Intake/Output Summary (Last 24 hours) at 11/27/2022 1445 Last data filed at 11/27/2022 1400 Gross per 24 hour  Intake 684.97 ml  Output 1375 ml  Net -690.03 ml   Filed Weights   11/24/22 1940  Weight: 63.8 kg     Exam General exam: Appears calm and comfortable  Respiratory system: Clear to auscultation. Respiratory effort normal. Cardiovascular system: S1 & S2 heard, RRR. No JVD, Gastrointestinal system: Abdomen is nondistended, soft and nontender. Central nervous system: Alert but with memory deficits .  Extremities:  no pedal edema.  Skin: No rashes,        Data Reviewed:  I have personally reviewed following labs and imaging studies   CBC Lab Results  Component Value Date   WBC 6.9 11/27/2022   RBC 3.58 (L) 11/27/2022   HGB 9.4 (L) 11/27/2022   HCT 30.2 (L) 11/27/2022   MCV 84.4 11/27/2022   MCH 26.3 11/27/2022   PLT 163 11/27/2022   MCHC 31.1 11/27/2022   RDW 15.0 11/27/2022   LYMPHSABS 1.1 11/22/2022   MONOABS 0.7 11/22/2022   EOSABS 0.1 11/22/2022   BASOSABS 0.1 11/22/2022     Last metabolic panel Lab Results  Component Value Date   NA 135 11/27/2022   K 3.8 11/27/2022   CL 105 11/27/2022   CO2 23 11/27/2022   BUN 17 11/27/2022   CREATININE 0.65 11/27/2022   GLUCOSE 112 (H) 11/27/2022   GFRNONAA >60 11/27/2022   GFRAA >60 06/06/2015   CALCIUM 7.9 (L) 11/27/2022   PHOS 3.6 09/17/2022   PROT 6.4 (L) 09/18/2022   ALBUMIN 3.6 09/18/2022   BILITOT 0.7 09/18/2022   ALKPHOS 55 09/18/2022   AST 39 09/18/2022   ALT 31 09/18/2022   ANIONGAP 7 11/27/2022    CBG (last 3)  Recent Labs    11/27/22 0336 11/27/22 0744 11/27/22 1129  GLUCAP 100* 123* 100*      Coagulation Profile: No results for input(s): "INR", "PROTIME" in the last 168 hours.   Radiology Studies: No results found.     Kathlen Mody M.D. [facility] 11/27/2022, 2:45 PM  Available via Epic secure chat 7am-7pm After 7 pm, please refer to night coverage  provider listed on amion.

## 2022-11-28 ENCOUNTER — Other Ambulatory Visit: Payer: Self-pay

## 2022-11-28 DIAGNOSIS — E785 Hyperlipidemia, unspecified: Secondary | ICD-10-CM | POA: Diagnosis not present

## 2022-11-28 DIAGNOSIS — S72001D Fracture of unspecified part of neck of right femur, subsequent encounter for closed fracture with routine healing: Secondary | ICD-10-CM | POA: Diagnosis not present

## 2022-11-28 DIAGNOSIS — D649 Anemia, unspecified: Secondary | ICD-10-CM | POA: Diagnosis not present

## 2022-11-28 LAB — BASIC METABOLIC PANEL
Anion gap: 8 (ref 5–15)
BUN: 16 mg/dL (ref 8–23)
CO2: 24 mmol/L (ref 22–32)
Calcium: 8.5 mg/dL — ABNORMAL LOW (ref 8.9–10.3)
Chloride: 105 mmol/L (ref 98–111)
Creatinine, Ser: 0.6 mg/dL (ref 0.44–1.00)
GFR, Estimated: >60 mL/min (ref >60)
Glucose, Bld: 147 mg/dL — ABNORMAL HIGH (ref 70–99)
Potassium: 4 mmol/L (ref 3.5–5.1)
Sodium: 137 mmol/L (ref 135–145)

## 2022-11-28 LAB — GLUCOSE, CAPILLARY
Glucose-Capillary: 96 mg/dL (ref 70–99)
Glucose-Capillary: 160 mg/dL — ABNORMAL HIGH (ref 70–99)
Glucose-Capillary: 147 mg/dL — ABNORMAL HIGH (ref 70–99)

## 2022-11-28 LAB — CBC
HCT: 32.7 % — ABNORMAL LOW (ref 36.0–46.0)
Hemoglobin: 10.4 g/dL — ABNORMAL LOW (ref 12.0–15.0)
MCH: 26.7 pg (ref 26.0–34.0)
MCHC: 31.8 g/dL (ref 30.0–36.0)
MCV: 84.1 fL (ref 80.0–100.0)
Platelets: 211 10*3/uL (ref 150–400)
RBC: 3.89 MIL/uL (ref 3.87–5.11)
RDW: 15.1 % (ref 11.5–15.5)
WBC: 6.5 10*3/uL (ref 4.0–10.5)
nRBC: 0 % (ref 0.0–0.2)

## 2022-11-28 MED ORDER — METHOCARBAMOL 500 MG PO TABS
500.0000 mg | ORAL_TABLET | Freq: Four times a day (QID) | ORAL | Status: DC | PRN
  Administered 2022-11-28: 500 mg via ORAL
  Filled 2022-11-28: qty 1

## 2022-11-28 MED ORDER — PROSOURCE PLUS PO LIQD: 30.0000 mL | Freq: Two times a day (BID) | ORAL | Status: AC

## 2022-11-28 NOTE — NC FL2 (Signed)
[place] MEDICAID FL2 LEVEL OF CARE FORM     IDENTIFICATION  Patient Name: Brenda Pham Birthdate: Nov 29, 1940 Sex: female Admission Date (Current Location): 11/22/2022  [facility] and [place] Number:  Producer, television/film/video and Address:  [facility],  [address] [place], [place] [address]      Provider Number: [id number]  Attending Physician Name and Address:  Kathlen Mody, MD  Relative Name and Phone Number:  daughter, Kennith Gain @ [phone]    Current Level of Care: Hospital Recommended Level of Care: Skilled Nursing Facility Prior Approval Number:    Date Approved/Denied:   PASRR Number: [id number]  Discharge Plan: SNF    Current Diagnoses: Patient Active Problem List   Diagnosis Date Noted   Edema of right lower extremity 11/23/2022   Normocytic anemia 11/23/2022   Hip fracture (HCC) 11/22/2022   Acute encephalopathy 09/17/2022   Type 2 diabetes mellitus (HCC) 09/17/2022   Neutropenia (HCC) 09/17/2022   Thrombocytopenia (HCC) 09/17/2022   Tailor's bunion of left foot 10/27/2021   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   Abscess of left foot 03/01/2021   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) 12/28/2020   History of adenomatous polyp of colon 08/30/2017   Hyperlipidemia 08/02/2017   Internal hemorrhoids 08/02/2017   Allergic reaction 04/04/2016   Drug allergy 03/28/2016   Overweight (BMI 25.0-29.9) 07/09/2014   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/07/2014    Orientation RESPIRATION BLADDER Height & Weight     Self  Normal Incontinent Weight: 140 lb 10.5 oz (63.8 kg) Height:  5\' 3"  (160 cm)  BEHAVIORAL SYMPTOMS/MOOD NEUROLOGICAL BOWEL NUTRITION STATUS      Incontinent Diet (low sodium)  AMBULATORY STATUS COMMUNICATION OF NEEDS Skin   Extensive Assist Verbally Other (Comment) (surgical incision only)                       Personal Care Assistance Level of Assistance  Bathing,  Feeding, Dressing Bathing Assistance: Limited assistance Feeding assistance: Limited assistance Dressing Assistance: Limited assistance     Functional Limitations Info  Sight, Hearing, Speech Sight Info: Adequate Hearing Info: Adequate Speech Info: Adequate    SPECIAL CARE FACTORS FREQUENCY  PT (By licensed PT), OT (By licensed OT)     PT Frequency: 5x/wk OT Frequency: 5x/wk            Contractures      Additional Factors Info  Code Status, Allergies, Psychotropic Code Status Info: DNR Allergies Info: Sulfa Antibiotics, Keflex (Cephalexin), Niacin And Related, Other, Septra (Sulfamethoxazole-trimethoprim) Psychotropic Info: see MAR         Current Medications (11/28/2022):  This is the current hospital active medication list Current Facility-Administered Medications  Medication Dose Route Frequency Provider Last Rate Last Admin   (feeding supplement) PROSource Plus liquid 30 mL  30 mL Oral BID BM Kathlen Mody, MD   30 mL at 11/28/22 0843   acetaminophen (TYLENOL) tablet 325-650 mg  325-650 mg Oral Q6H PRN Samson Frederic, MD   500 mg at 11/26/22 0838   enoxaparin (LOVENOX) injection 40 mg  40 mg Subcutaneous Q24H Edmisten, Kristie L, PA   40 mg at 11/28/22 0843   HYDROcodone-acetaminophen (NORCO) 7.5-325 MG per tablet 1-2 tablet  1-2 tablet Oral Q4H PRN Samson Frederic, MD   1 tablet at 11/27/22 1735   HYDROcodone-acetaminophen (NORCO/VICODIN) 5-325 MG per tablet 1-2 tablet  1-2 tablet Oral Q4H PRN Samson Frederic, MD  insulin aspart (novoLOG) injection 0-9 Units  0-9 Units Subcutaneous Q4H John Giovanni, MD   2 Units at 11/28/22 0842   lactulose (CHRONULAC) 10 GM/15ML solution 10 g  10 g Oral Christy Sartorius, MD   10 g at 11/28/22 0843   LORazepam (ATIVAN) tablet 0.25 mg  0.25 mg Oral Q8H PRN Kathlen Mody, MD       melatonin tablet 3 mg  3 mg Oral QHS Kathlen Mody, MD   3 mg at 11/27/22 2055   menthol-cetylpyridinium (CEPACOL) lozenge 3 mg  1 lozenge Oral  PRN Samson Frederic, MD       Or   phenol (CHLORASEPTIC) mouth spray 1 spray  1 spray Mouth/Throat PRN Swinteck, Arlys John, MD       methocarbamol (ROBAXIN) 500 mg in dextrose 5 % 50 mL IVPB  500 mg Intravenous Q8H PRN John Giovanni, MD   Stopped at 11/27/22 0203   metoCLOPramide (REGLAN) tablet 5-10 mg  5-10 mg Oral Q8H PRN Swinteck, Arlys John, MD       Or   metoCLOPramide (REGLAN) injection 5-10 mg  5-10 mg Intravenous Q8H PRN Swinteck, Arlys John, MD       naloxone [facility]) injection 0.4 mg  0.4 mg Intravenous PRN John Giovanni, MD       ondansetron [facility]) tablet 4 mg  4 mg Oral Q6H PRN Swinteck, Arlys John, MD       Or   ondansetron (ZOFRAN) injection 4 mg  4 mg Intravenous Q6H PRN Swinteck, Arlys John, MD       polyethylene glycol (MIRALAX / GLYCOLAX) packet 17 g  17 g Oral Daily PRN Kathlen Mody, MD       potassium chloride (KLOR-CON) CR tablet 10 mEq  10 mEq Oral Daily Kathlen Mody, MD   10 mEq at 11/28/22 0844   senna-docusate (Senokot-S) tablet 2 tablet  2 tablet Oral BID Kathlen Mody, MD   2 tablet at 11/28/22 0843   thiamine (VITAMIN B1) tablet 100 mg  100 mg Oral Daily Kathlen Mody, MD   100 mg at 11/28/22 1610     Discharge Medications: Please see discharge summary for a list of discharge medications.  Relevant Imaging Results:  Relevant Lab Results:   Additional Information SS#[id number]  Nilah Belcourt, LCSW

## 2022-11-28 NOTE — TOC Transition Note (Signed)
Transition of Care [facility]) - CM/SW Discharge Note   Patient Details  Name: Brenda Pham MRN: [id number] Date of Birth: [birth year]  Transition of Care [facility]) CM/SW Contact:  Amada Jupiter, LCSW Phone Number: 11/28/2022, 2:13 PM   Clinical Narrative:     Pt medically cleared for dc today and plan to return to SNF [facility]  Pt and daughter agreeable and facility ready for readmission.  PTAR called at 1:30pm.  RN to call report to [phone].  No further TOC needs.  Final next level of care: Skilled Nursing Facility Barriers to Discharge: Barriers Resolved   Patient Goals and CMS Choice      Discharge Placement                Patient chooses bed at: [facility] Patient to be transferred to facility by: PTAR Name of family member notified: daughter Patient and family notified of of transfer: 11/28/22  Discharge Plan and Services Additional resources added to the After Visit Summary for   In-house Referral: Clinical Social Work   Post Acute Care Choice: Skilled Nursing Facility          DME Arranged: N/A DME Agency: NA                  Social Determinants of Health (SDOH) Interventions SDOH Screenings   Food Insecurity: No Food Insecurity (11/23/2022)  Housing: Low Risk  (11/23/2022)  Transportation Needs: No Transportation Needs (11/23/2022)  Utilities: Not At Risk (11/23/2022)  Tobacco Use: Medium Risk (11/22/2022)     Readmission Risk Interventions    11/23/2022    1:22 PM 09/20/2022    2:11 PM  Readmission Risk Prevention Plan  Post Dischage Appt Complete Complete  Medication Screening Complete Complete  Transportation Screening Complete Complete

## 2022-11-28 NOTE — Progress Notes (Signed)
    Subjective: Patient has dementia at baseline. Woken up for exam. Non verbal this morning and confused.  Exam limited due to mental status.   Objective:   VITALS:   Vitals:   11/27/22 0601 11/27/22 1455 11/27/22 2059 11/28/22 0617  BP: 133/76 (!) 118/57 (!) 157/75 (!) 146/78  Pulse: 96 98 (!) 106 (!) 103  Resp: 18 15 18 18   Temp:  98.8 F (37.1 C) 98.7 F (37.1 C) 98.5 F (36.9 C)  TempSrc:   Axillary Axillary  SpO2: 94% 97% 95% 94%  Weight:      Height:        Patient sleeping, woken up for exam. Disoriented this morning not communicating. NAD.  She can follow commands this morning. Wiggles toes well con exam.  Distal pulses intact. Motor function intact.  Unable to assess sensation. Dressings C/D/I.    Lab Results  Component Value Date   WBC 6.5 11/28/2022   HGB 10.4 (L) 11/28/2022   HCT 32.7 (L) 11/28/2022   MCV 84.1 11/28/2022   PLT 211 11/28/2022   BMET    Component Value Date/Time   NA 135 11/27/2022 0339   K 3.8 11/27/2022 0339   CL 105 11/27/2022 0339   CO2 23 11/27/2022 0339   GLUCOSE 112 (H) 11/27/2022 0339   BUN 17 11/27/2022 0339   CREATININE 0.65 11/27/2022 0339   CALCIUM 7.9 (L) 11/27/2022 0339   GFRNONAA >60 11/27/2022 5284     Assessment/Plan: 3 Days Post-Op   Principal Problem:   Hip fracture (HCC) Active Problems:   Hyperlipidemia   Type 2 diabetes mellitus (HCC)   Edema of right lower extremity   Normocytic anemia   WBAT with walker DVT ppx: Lovenox, SCDs, TEDS PO pain control PT/OT: PT did not see yesterday. Continue PT today.  Dispo: Patient under care of the medical team, disposition per their recommendation. Pain medication and DVT ppx printed in chart.    Clois Dupes, PA-C 11/28/2022, 8:04 AM   [facility]  [facility] [address], [address], [place], [place] [address] Phone: [phone] [email or web address] Facebook  [employer]

## 2022-11-28 NOTE — Discharge Summary (Signed)
Physician Discharge Summary   Patient: Brenda Pham MRN: [id number] DOB: Sep 19, 1940  Admit date:     11/22/2022  Discharge date: 11/28/22  Discharge Physician: Kathlen Mody   PCP: Eloisa Northern, MD   Recommendations at discharge:  Please follow up with Orthopedics as recommended.  Please check with PCP in one week.    Discharge Diagnoses: Principal Problem:   Hip fracture (HCC) Active Problems:   Hyperlipidemia   Type 2 diabetes mellitus (HCC)   Edema of right lower extremity   Normocytic anemia  Resolved Problems:   * No resolved hospital problems. *  Hospital Course: 82 y.o. female with medical history significant of diet-controlled type 2 diabetes, hyperlipidemia, history of breast cancer, osteoporosis, B12 deficiency.  She was evaluated in the ED earlier today after a mechanical fall.  CT head/C-spine and x-ray of right knee were negative for acute findings.  She was discharged back to her facility.  Patient returns to the ED this evening complaining of right hip pain.  X-ray of right femur showing acute proximal femoral intertrochanteric fracture.  Chest x-ray showing no acute cardiopulmonary disease.    Assessment and Plan:   Acute proximal femoral intertrochanteric fracture secondary to mechanical fall Orthopedics consulted and  she underwent  Intramedullary fixation of the Right femur.  Pain control with tylenol, vicodin.  Therapy eval recommending Snf.      Unilateral right lower extremity edema of unclear etiology. Doppler ultrasound ordered and negative for DVT.     Mild anemia of chronic disease/ acute anemia of blood loss from OR Monitor.  Hemoglobin stabilized at 9.         Diet controlled diabetes mellitus Last A1c 6.1    Hyperlipidemia     Post op delirium:  Resolved with a dose of haldol.      Constipation:  - started the patient on lactulose along with senna , colace and miralax.     Consultants: orthopedics. Procedures  performed: surgical repair   Disposition: Skilled nursing facility Diet recommendation:  Discharge Diet Orders (From admission, onward)     Start     Ordered   11/28/22 0000  Diet - low sodium heart healthy        11/28/22 1034           Regular diet DISCHARGE MEDICATION: Allergies as of 11/28/2022       Reactions   Sulfa Antibiotics Rash   Keflex [cephalexin]    Niacin And Related    Extreme flushing, heart racing   Other    "Clindamycin"-Antibiotic caused thrush.    Septra [sulfamethoxazole-trimethoprim]         Medication List     STOP taking these medications    folic acid-pyridoxine-cyancobalamin 2.5-25-2 MG Tabs tablet Commonly known as: FOLTX   LORazepam 0.5 MG tablet Commonly known as: ATIVAN       TAKE these medications    (feeding supplement) PROSource Plus liquid Take 30 mLs by mouth 2 (two) times daily between meals.   aspirin 81 MG chewable tablet Commonly known as: Aspirin Childrens Chew 1 tablet (81 mg total) by mouth 2 (two) times daily with a meal.   furosemide 20 MG tablet Commonly known as: LASIX Take 40 mg by mouth in the morning.   HYDROcodone-acetaminophen 5-325 MG tablet Commonly known as: NORCO/VICODIN Take 1 tablet by mouth every 4 (four) hours as needed for up to 7 days for moderate pain or severe pain.   lactulose 10 GM/15ML solution Commonly known as:  CHRONULAC Take 10 g by mouth as directed. Give 15 mls BID Every Other Day   melatonin 3 MG Tabs tablet Take 3 mg by mouth at bedtime.   potassium chloride 10 MEQ tablet Commonly known as: KLOR-CON Take 10 mEq by mouth in the morning.   thiamine 100 MG tablet Commonly known as: VITAMIN B1 Take 100 mg by mouth daily.        Follow-up Information     Eloisa Northern, MD. Schedule an appointment as soon as possible for a visit in 1 week(s).   Specialty: Internal Medicine Contact information: [address] [place] [address] [phone]                 Discharge Exam: Ceasar Mons Weights   11/24/22 1940  Weight: 63.8 kg   General exam: Appears calm and comfortable  Respiratory system: Clear to auscultation. Respiratory effort normal. Cardiovascular system: S1 & S2 heard, RRR. No JVD, murmurs, rubs, gallops or clicks. No pedal edema. Gastrointestinal system: Abdomen is nondistended, soft and nontender. No organomegaly or masses felt. Normal bowel sounds heard. Central nervous system: Alert and oriented to person on ly.  Extremities: Symmetric 5 x 5 power. Skin: No rashes, lesions or ulcers Psychiatry: Judgement and insight appear normal. Mood & affect appropriate.    Condition at discharge: fair  The results of significant diagnostics from this hospitalization (including imaging, microbiology, ancillary and laboratory) are listed below for reference.   Imaging Studies: Pelvis Portable  Result Date: 11/25/2022 CLINICAL DATA:  [id number] Closed comminuted intertrochanteric fracture of proximal end of right femur [facility]) [id number] EXAM: PORTABLE PELVIS 1-2 VIEWS COMPARISON:  Preoperative imaging FINDINGS: Intramedullary nail with trans trochanteric and distal locking screw fixation of intertrochanteric femur fracture. There is displacement of the fracture fragment involving the lesser trochanter. Recent postsurgical change includes air and edema in the soft tissues. Skin staples in place. IMPRESSION: ORIF of intertrochanteric femur fracture. Electronically Signed   By: Narda Rutherford M.D.   On: 11/25/2022 12:58   DG HIP UNILAT WITH PELVIS 1V RIGHT  Result Date: 11/25/2022 CLINICAL DATA:  Elective surgery. EXAM: DG HIP (WITH OR WITHOUT PELVIS) 1V RIGHT COMPARISON:  Preoperative imaging. FINDINGS: Three fluoroscopic spot views of the right hip obtained in the operating room. Intramedullary nail with trans trochanteric and distal locking screw fixation traverse intertrochanteric femur fracture. Fluoroscopy time 59 seconds. Dose 7.8 mGy. IMPRESSION:  Intraoperative fluoroscopy during ORIF of right intertrochanteric femur fracture. Electronically Signed   By: Narda Rutherford M.D.   On: 11/25/2022 12:56   DG C-Arm 1-60 Min-No Report  Result Date: 11/25/2022 Fluoroscopy was utilized by the requesting physician.  No radiographic interpretation.   DG C-Arm 1-60 Min-No Report  Result Date: 11/25/2022 Fluoroscopy was utilized by the requesting physician.  No radiographic interpretation.   VAS [place] LOWER EXTREMITY VENOUS (DVT)  Result Date: 11/23/2022  Lower Venous DVT Study Patient Name:  MAKYNZI BARDWELL  Date of Exam:   11/23/2022 Medical Rec #: [id number]            Accession #:    [id number] Date of Birth: 1941-04-07           Patient Gender: F Patient Age:   58 years Exam Location:  [facility] Procedure:      VAS [place] LOWER EXTREMITY VENOUS (DVT) Referring Phys: Ulyess Blossom RATHORE --------------------------------------------------------------------------------  Indications: Edema.  Comparison Study: Previous exam on 08/02/2015 was negative for DVT Performing Technologist: Ernestene Mention RVT, RDMS  Examination  Guidelines: A complete evaluation includes B-mode imaging, spectral Doppler, color Doppler, and power Doppler as needed of all accessible portions of each vessel. Bilateral testing is considered an integral part of a complete examination. Limited examinations for reoccurring indications may be performed as noted. The reflux portion of the exam is performed with the patient in reverse Trendelenburg.  +---------+---------------+---------+-----------+----------+-------------------+ RIGHT    CompressibilityPhasicitySpontaneityPropertiesThrombus Aging      +---------+---------------+---------+-----------+----------+-------------------+ CFV      Full           Yes      Yes                                      +---------+---------------+---------+-----------+----------+-------------------+ SFJ      Full                                                              +---------+---------------+---------+-----------+----------+-------------------+ FV Prox  Full           Yes      Yes                                      +---------+---------------+---------+-----------+----------+-------------------+ FV Mid   Full           Yes      Yes                                      +---------+---------------+---------+-----------+----------+-------------------+ FV DistalFull           Yes      Yes                                      +---------+---------------+---------+-----------+----------+-------------------+ PFV      Full                                                             +---------+---------------+---------+-----------+----------+-------------------+ POP      Full           Yes      Yes                                      +---------+---------------+---------+-----------+----------+-------------------+ PTV      Full                                                             +---------+---------------+---------+-----------+----------+-------------------+ PERO     Full  Not well visualized +---------+---------------+---------+-----------+----------+-------------------+   +----+---------------+---------+-----------+----------+--------------+ LEFTCompressibilityPhasicitySpontaneityPropertiesThrombus Aging +----+---------------+---------+-----------+----------+--------------+ CFV Full           Yes      Yes                                 +----+---------------+---------+-----------+----------+--------------+     Summary: RIGHT: - There is no evidence of deep vein thrombosis in the lower extremity.  - No cystic structure found in the popliteal fossa. Subcutaneous edema extending from popliteal fossa to ankle.  LEFT: - No evidence of common femoral vein obstruction.  *See table(s) above for measurements and observations. Electronically signed by Heath Lark on 11/23/2022 at 7:42:30 PM.    Final    DG Pelvis 1-2 Views  Result Date: 11/22/2022 CLINICAL DATA:  Fall yesterday.  Persistent right hip pain. EXAM: PELVIS - 1-2 VIEW; RIGHT FEMUR 2 VIEWS COMPARISON:  AP pelvis 03/17/2021 FINDINGS: There is diffuse decreased bone mineralization. There is an acute proximal femoral intertrochanteric fracture with up to approximately 5 mm fracture line diastasis but otherwise no significant displacement. Status post total right knee arthroplasty without evidence of hardware failure. No right knee joint effusion. Surgical clips overlie the pelvis. Mild-to-moderate atherosclerotic calcifications. IMPRESSION: Acute proximal femoral intertrochanteric fracture. Electronically Signed   By: Neita Garnet M.D.   On: 11/22/2022 20:04   DG Femur Min 2 Views Right  Result Date: 11/22/2022 CLINICAL DATA:  Fall yesterday.  Persistent right hip pain. EXAM: PELVIS - 1-2 VIEW; RIGHT FEMUR 2 VIEWS COMPARISON:  AP pelvis 03/17/2021 FINDINGS: There is diffuse decreased bone mineralization. There is an acute proximal femoral intertrochanteric fracture with up to approximately 5 mm fracture line diastasis but otherwise no significant displacement. Status post total right knee arthroplasty without evidence of hardware failure. No right knee joint effusion. Surgical clips overlie the pelvis. Mild-to-moderate atherosclerotic calcifications. IMPRESSION: Acute proximal femoral intertrochanteric fracture. Electronically Signed   By: Neita Garnet M.D.   On: 11/22/2022 20:04   DG Chest 1 View  Result Date: 11/22/2022 CLINICAL DATA:  Fall yesterday. EXAM: CHEST  1 VIEW COMPARISON:  AP chest 09/16/2022, CT chest 05/26/2022, CT chest 07/01/2020, chest two views 08/07/2017 FINDINGS: The patient is rotated to the left. A skin fold overlies the superior right lung and inferior left lung. Heart size is not well evaluated given projection. Mediastinal contours are grossly unremarkable but also  limited by projection. Right axillary surgical clips are again seen. Long-term stable right middle lobe nodule as seen on prior 05/26/2022 and 07/01/2020 CTs. Left breast prosthesis again noted. Moderately decreased lung volumes with bibasilar bronchovascular crowding. No definitive focal airspace opacity. No pleural effusion or pneumothorax. No acute skeletal abnormality. Surgical clips again overlie the inferior right breast. IMPRESSION: 1. No acute cardiopulmonary process. 2. Chronic findings as above. Electronically Signed   By: Neita Garnet M.D.   On: 11/22/2022 20:02   CT Head Wo Contrast  Result Date: 11/22/2022 CLINICAL DATA:  82 year old female with history of head trauma from a fall. EXAM: CT HEAD WITHOUT CONTRAST CT CERVICAL SPINE WITHOUT CONTRAST TECHNIQUE: Multidetector CT imaging of the head and cervical spine was performed following the standard protocol without intravenous contrast. Multiplanar CT image reconstructions of the cervical spine were also generated. RADIATION DOSE REDUCTION: This exam was performed according to the departmental dose-optimization program which includes automated exposure control, adjustment of the mA and/or kV according to patient size and/or use of iterative reconstruction  technique. COMPARISON:  No priors. FINDINGS: CT HEAD FINDINGS Brain: Mild cerebral atrophy. Patchy and confluent areas of decreased attenuation are noted throughout the deep and periventricular white matter of the cerebral hemispheres bilaterally, compatible with chronic microvascular ischemic disease. Physiologic calcifications in the basal ganglia bilaterally are incidentally noted. No evidence of acute infarction, hemorrhage, hydrocephalus, extra-axial collection or mass lesion/mass effect. Vascular: No hyperdense vessel or unexpected calcification. Skull: Normal. Negative for fracture or focal lesion. Sinuses/Orbits: No acute finding. Other: None. CT CERVICAL SPINE FINDINGS Alignment: Normal.  Skull base and vertebrae: No acute fracture. No primary bone lesion or focal pathologic process. Soft tissues and spinal canal: No prevertebral fluid or swelling. No visible canal hematoma. Disc levels: Multilevel degenerative disc disease, most evident at C5-C6. Mild to moderate multilevel facet arthropathy bilaterally. Upper chest: Unremarkable. Other: None. IMPRESSION: 1. No evidence of significant acute traumatic injury to the skull, brain or cervical spine. 2. Mild cerebral atrophy with chronic microvascular ischemic changes in the cerebral white matter, as above. 3. Mild multilevel degenerative disc disease and cervical spondylosis, as above. Electronically Signed   By: Trudie Reed M.D.   On: 11/22/2022 05:52   CT Cervical Spine Wo Contrast  Result Date: 11/22/2022 CLINICAL DATA:  82 year old female with history of head trauma from a fall. EXAM: CT HEAD WITHOUT CONTRAST CT CERVICAL SPINE WITHOUT CONTRAST TECHNIQUE: Multidetector CT imaging of the head and cervical spine was performed following the standard protocol without intravenous contrast. Multiplanar CT image reconstructions of the cervical spine were also generated. RADIATION DOSE REDUCTION: This exam was performed according to the departmental dose-optimization program which includes automated exposure control, adjustment of the mA and/or kV according to patient size and/or use of iterative reconstruction technique. COMPARISON:  No priors. FINDINGS: CT HEAD FINDINGS Brain: Mild cerebral atrophy. Patchy and confluent areas of decreased attenuation are noted throughout the deep and periventricular white matter of the cerebral hemispheres bilaterally, compatible with chronic microvascular ischemic disease. Physiologic calcifications in the basal ganglia bilaterally are incidentally noted. No evidence of acute infarction, hemorrhage, hydrocephalus, extra-axial collection or mass lesion/mass effect. Vascular: No hyperdense vessel or unexpected  calcification. Skull: Normal. Negative for fracture or focal lesion. Sinuses/Orbits: No acute finding. Other: None. CT CERVICAL SPINE FINDINGS Alignment: Normal. Skull base and vertebrae: No acute fracture. No primary bone lesion or focal pathologic process. Soft tissues and spinal canal: No prevertebral fluid or swelling. No visible canal hematoma. Disc levels: Multilevel degenerative disc disease, most evident at C5-C6. Mild to moderate multilevel facet arthropathy bilaterally. Upper chest: Unremarkable. Other: None. IMPRESSION: 1. No evidence of significant acute traumatic injury to the skull, brain or cervical spine. 2. Mild cerebral atrophy with chronic microvascular ischemic changes in the cerebral white matter, as above. 3. Mild multilevel degenerative disc disease and cervical spondylosis, as above. Electronically Signed   By: Trudie Reed M.D.   On: 11/22/2022 05:52   DG Knee Complete 4 Views Right  Result Date: 11/22/2022 CLINICAL DATA:  Fall EXAM: RIGHT KNEE - COMPLETE 4+ VIEW COMPARISON:  None Available. FINDINGS: Changes of right knee replacement. No hardware complicating feature. No acute bony abnormality. Specifically, no fracture, subluxation, or dislocation. No joint effusion. IMPRESSION: Right knee replacement.  No acute bony abnormality. Electronically Signed   By: Charlett Nose M.D.   On: 11/22/2022 03:39    Microbiology: Results for orders placed or performed during the hospital encounter of 11/22/22  Surgical pcr screen     Status: None   Collection Time: 11/23/22  9:02 PM   Specimen: Nasal Mucosa; Nasal Swab  Result Value Ref Range Status   MRSA, PCR NEGATIVE NEGATIVE Final   Staphylococcus aureus NEGATIVE NEGATIVE Final    Comment: (NOTE) The Xpert SA Assay (FDA approved for NASAL specimens in patients 92 years of age and older), is one component of a comprehensive surveillance program. It is not intended to diagnose infection nor to guide or monitor treatment. Performed  at [facility], [address] [place], [place] [address]     Labs: CBC: Recent Labs  Lab 11/22/22 1949 11/23/22 0340 11/25/22 0337 11/25/22 1410 11/26/22 0952 11/27/22 0339 11/28/22 0720  WBC 9.5   < > 7.6 7.3 9.9 6.9 6.5  NEUTROABS 7.5  --   --   --   --   --   --   HGB 11.3*   < > 10.1* 10.6* 9.3* 9.4* 10.4*  HCT 35.7*   < > 33.1* 34.1* 30.0* 30.2* 32.7*  MCV 83.8   < > 86.4 85.0 85.0 84.4 84.1  PLT 158   < > 139* 155 186 163 211   < > = values in this interval not displayed.   Basic Metabolic Panel: Recent Labs  Lab 11/23/22 0340 11/25/22 0337 11/25/22 1410 11/26/22 0952 11/27/22 0339 11/28/22 0720  NA 136 137  --  137 135 137  K 3.9 4.0  --  3.8 3.8 4.0  CL 101 102  --  105 105 105  CO2 26 24  --  23 23 24   GLUCOSE 150* 147*  --  145* 112* 147*  BUN 17 17  --  17 17 16   CREATININE 0.78 0.69 0.64 0.60 0.65 0.60  CALCIUM 8.7* 8.7*  --  8.0* 7.9* 8.5*   Liver Function Tests: No results for input(s): "AST", "ALT", "ALKPHOS", "BILITOT", "PROT", "ALBUMIN" in the last 168 hours. CBG: Recent Labs  Lab 11/27/22 1709 11/27/22 2016 11/27/22 2339 11/28/22 0342 11/28/22 0806  GLUCAP 96 141* 95 96 160*    Discharge time spent: 42 minutes.   Signed: Kathlen Mody, MD [facility] 11/28/2022

## 2022-11-29 ENCOUNTER — Emergency Department (HOSPITAL_COMMUNITY): Payer: Medicare Other

## 2022-11-29 ENCOUNTER — Inpatient Hospital Stay (HOSPITAL_COMMUNITY)
Admission: EM | Admit: 2022-11-29 | Discharge: 2022-12-02 | DRG: 689 | Disposition: A | Discharge status: Skilled Nursing Facility | Payer: Medicare Other | Source: Skilled Nursing Facility | Attending: Internal Medicine | Admitting: Internal Medicine

## 2022-11-29 ENCOUNTER — Other Ambulatory Visit: Payer: Self-pay

## 2022-11-29 ENCOUNTER — Encounter (HOSPITAL_COMMUNITY): Payer: Self-pay

## 2022-11-29 DIAGNOSIS — Z1152 Encounter for screening for COVID-19: Secondary | ICD-10-CM

## 2022-11-29 DIAGNOSIS — E785 Hyperlipidemia, unspecified: Secondary | ICD-10-CM | POA: Diagnosis present

## 2022-11-29 DIAGNOSIS — Z79890 Hormone replacement therapy (postmenopausal): Secondary | ICD-10-CM

## 2022-11-29 DIAGNOSIS — Z96651 Presence of right artificial knee joint: Secondary | ICD-10-CM | POA: Diagnosis present

## 2022-11-29 DIAGNOSIS — R1310 Dysphagia, unspecified: Secondary | ICD-10-CM | POA: Diagnosis present

## 2022-11-29 DIAGNOSIS — W19XXXD Unspecified fall, subsequent encounter: Secondary | ICD-10-CM | POA: Diagnosis present

## 2022-11-29 DIAGNOSIS — B9620 Unspecified Escherichia coli [E. coli] as the cause of diseases classified elsewhere: Secondary | ICD-10-CM | POA: Diagnosis present

## 2022-11-29 DIAGNOSIS — E119 Type 2 diabetes mellitus without complications: Secondary | ICD-10-CM | POA: Diagnosis not present

## 2022-11-29 DIAGNOSIS — Z923 Personal history of irradiation: Secondary | ICD-10-CM

## 2022-11-29 DIAGNOSIS — N3000 Acute cystitis without hematuria: Principal | ICD-10-CM | POA: Diagnosis present

## 2022-11-29 DIAGNOSIS — Z7982 Long term (current) use of aspirin: Secondary | ICD-10-CM

## 2022-11-29 DIAGNOSIS — Z9013 Acquired absence of bilateral breasts and nipples: Secondary | ICD-10-CM | POA: Diagnosis not present

## 2022-11-29 DIAGNOSIS — N390 Urinary tract infection, site not specified: Secondary | ICD-10-CM

## 2022-11-29 DIAGNOSIS — L89109 Pressure ulcer of unspecified part of back, unspecified stage: Secondary | ICD-10-CM

## 2022-11-29 DIAGNOSIS — Z881 Allergy status to other antibiotic agents status: Secondary | ICD-10-CM | POA: Diagnosis not present

## 2022-11-29 DIAGNOSIS — Z9841 Cataract extraction status, right eye: Secondary | ICD-10-CM

## 2022-11-29 DIAGNOSIS — Z89422 Acquired absence of other left toe(s): Secondary | ICD-10-CM | POA: Diagnosis not present

## 2022-11-29 DIAGNOSIS — F03C0 Unspecified dementia, severe, without behavioral disturbance, psychotic disturbance, mood disturbance, and anxiety: Secondary | ICD-10-CM | POA: Diagnosis present

## 2022-11-29 DIAGNOSIS — Z882 Allergy status to sulfonamides status: Secondary | ICD-10-CM | POA: Diagnosis not present

## 2022-11-29 DIAGNOSIS — B952 Enterococcus as the cause of diseases classified elsewhere: Secondary | ICD-10-CM | POA: Diagnosis present

## 2022-11-29 DIAGNOSIS — Z79899 Other long term (current) drug therapy: Secondary | ICD-10-CM

## 2022-11-29 DIAGNOSIS — Z853 Personal history of malignant neoplasm of breast: Secondary | ICD-10-CM

## 2022-11-29 DIAGNOSIS — D649 Anemia, unspecified: Secondary | ICD-10-CM | POA: Diagnosis present

## 2022-11-29 DIAGNOSIS — R5383 Other fatigue: Secondary | ICD-10-CM | POA: Diagnosis present

## 2022-11-29 DIAGNOSIS — L89152 Pressure ulcer of sacral region, stage 2: Secondary | ICD-10-CM | POA: Diagnosis present

## 2022-11-29 DIAGNOSIS — Z841 Family history of disorders of kidney and ureter: Secondary | ICD-10-CM

## 2022-11-29 DIAGNOSIS — Z9842 Cataract extraction status, left eye: Secondary | ICD-10-CM | POA: Diagnosis not present

## 2022-11-29 DIAGNOSIS — Z87891 Personal history of nicotine dependence: Secondary | ICD-10-CM

## 2022-11-29 DIAGNOSIS — Z8781 Personal history of (healed) traumatic fracture: Secondary | ICD-10-CM

## 2022-11-29 DIAGNOSIS — Z8249 Family history of ischemic heart disease and other diseases of the circulatory system: Secondary | ICD-10-CM

## 2022-11-29 DIAGNOSIS — Z66 Do not resuscitate: Secondary | ICD-10-CM | POA: Diagnosis present

## 2022-11-29 DIAGNOSIS — G9341 Metabolic encephalopathy: Secondary | ICD-10-CM | POA: Diagnosis present

## 2022-11-29 DIAGNOSIS — E1142 Type 2 diabetes mellitus with diabetic polyneuropathy: Secondary | ICD-10-CM | POA: Diagnosis present

## 2022-11-29 DIAGNOSIS — L89159 Pressure ulcer of sacral region, unspecified stage: Secondary | ICD-10-CM

## 2022-11-29 DIAGNOSIS — S7291XD Unspecified fracture of right femur, subsequent encounter for closed fracture with routine healing: Secondary | ICD-10-CM

## 2022-11-29 DIAGNOSIS — Z833 Family history of diabetes mellitus: Secondary | ICD-10-CM

## 2022-11-29 DIAGNOSIS — M810 Age-related osteoporosis without current pathological fracture: Secondary | ICD-10-CM | POA: Diagnosis present

## 2022-11-29 DIAGNOSIS — Z888 Allergy status to other drugs, medicaments and biological substances status: Secondary | ICD-10-CM | POA: Diagnosis not present

## 2022-11-29 LAB — URINALYSIS, W/ REFLEX TO CULTURE (INFECTION SUSPECTED)
Bilirubin Urine: NEGATIVE
Glucose, UA: NEGATIVE mg/dL
Hgb urine dipstick: NEGATIVE
Ketones, ur: 5 mg/dL — AB
Nitrite: NEGATIVE
Protein, ur: NEGATIVE mg/dL
Specific Gravity, Urine: 1.016 (ref 1.005–1.030)
WBC, UA: >50 WBC/hpf (ref 0–5)
pH: 7 (ref 5.0–8.0)

## 2022-11-29 LAB — COMPREHENSIVE METABOLIC PANEL
ALT: 35 U/L (ref 0–44)
AST: 43 U/L — ABNORMAL HIGH (ref 15–41)
Albumin: 2.7 g/dL — ABNORMAL LOW (ref 3.5–5.0)
Alkaline Phosphatase: 82 U/L (ref 38–126)
Anion gap: 9 (ref 5–15)
BUN: 13 mg/dL (ref 8–23)
CO2: 22 mmol/L (ref 22–32)
Calcium: 8.5 mg/dL — ABNORMAL LOW (ref 8.9–10.3)
Chloride: 104 mmol/L (ref 98–111)
Creatinine, Ser: 0.63 mg/dL (ref 0.44–1.00)
GFR, Estimated: >60 mL/min (ref >60)
Glucose, Bld: 127 mg/dL — ABNORMAL HIGH (ref 70–99)
Potassium: 4.1 mmol/L (ref 3.5–5.1)
Sodium: 135 mmol/L (ref 135–145)
Total Bilirubin: 1.1 mg/dL (ref 0.3–1.2)
Total Protein: 5.9 g/dL — ABNORMAL LOW (ref 6.5–8.1)

## 2022-11-29 LAB — PROTIME-INR
INR: 1.2 (ref 0.8–1.2)
Prothrombin Time: 15.5 seconds — ABNORMAL HIGH (ref 11.4–15.2)

## 2022-11-29 LAB — CBC WITH DIFFERENTIAL/PLATELET
Abs Immature Granulocytes: 0.16 10*3/uL — ABNORMAL HIGH (ref 0.00–0.07)
Basophils Absolute: 0 10*3/uL (ref 0.0–0.1)
Basophils Relative: 1 %
Eosinophils Absolute: 0.1 10*3/uL (ref 0.0–0.5)
Eosinophils Relative: 2 %
HCT: 32 % — ABNORMAL LOW (ref 36.0–46.0)
Hemoglobin: 9.8 g/dL — ABNORMAL LOW (ref 12.0–15.0)
Immature Granulocytes: 2 %
Lymphocytes Relative: 16 %
Lymphs Abs: 1.1 10*3/uL (ref 0.7–4.0)
MCH: 25.5 pg — ABNORMAL LOW (ref 26.0–34.0)
MCHC: 30.6 g/dL (ref 30.0–36.0)
MCV: 83.1 fL (ref 80.0–100.0)
Monocytes Absolute: 0.6 10*3/uL (ref 0.1–1.0)
Monocytes Relative: 9 %
Neutro Abs: 4.8 10*3/uL (ref 1.7–7.7)
Neutrophils Relative %: 70 %
Platelets: 247 10*3/uL (ref 150–400)
RBC: 3.85 MIL/uL — ABNORMAL LOW (ref 3.87–5.11)
RDW: 15.9 % — ABNORMAL HIGH (ref 11.5–15.5)
WBC: 6.9 10*3/uL (ref 4.0–10.5)
nRBC: 0 % (ref 0.0–0.2)

## 2022-11-29 LAB — URINE CULTURE

## 2022-11-29 LAB — GLUCOSE, CAPILLARY: Glucose-Capillary: 111 mg/dL — ABNORMAL HIGH (ref 70–99)

## 2022-11-29 LAB — SARS CORONAVIRUS 2 BY RT PCR: SARS Coronavirus 2 by RT PCR: NEGATIVE

## 2022-11-29 LAB — I-STAT CG4 LACTIC ACID, ED: Lactic Acid, Venous: 1.4 mmol/L (ref 0.5–1.9)

## 2022-11-29 MED ORDER — HYDROCODONE-ACETAMINOPHEN 5-325 MG PO TABS
1.0000 | ORAL_TABLET | Freq: Once | ORAL | Status: DC
  Filled 2022-11-29: qty 1

## 2022-11-29 MED ORDER — SODIUM CHLORIDE 0.9 % IV SOLN
1.0000 g | Freq: Once | INTRAVENOUS | Status: AC
  Administered 2022-11-29: 1 g via INTRAVENOUS
  Filled 2022-11-29: qty 10

## 2022-11-29 MED ORDER — MORPHINE SULFATE (PF) 2 MG/ML IV SOLN
2.0000 mg | Freq: Once | INTRAVENOUS | Status: AC
  Administered 2022-11-29: 2 mg via INTRAVENOUS
  Filled 2022-11-29: qty 1

## 2022-11-29 MED ORDER — MORPHINE SULFATE (PF) 2 MG/ML IV SOLN
2.0000 mg | INTRAVENOUS | Status: DC | PRN
  Administered 2022-11-29 – 2022-11-30 (×2): 2 mg via INTRAVENOUS
  Filled 2022-11-29 (×3): qty 1

## 2022-11-29 MED ORDER — SODIUM CHLORIDE 0.9 % IV SOLN
1.0000 g | INTRAVENOUS | Status: DC
  Administered 2022-11-30: 1 g via INTRAVENOUS
  Filled 2022-11-29: qty 10

## 2022-11-29 MED ORDER — ENOXAPARIN SODIUM 40 MG/0.4ML IJ SOSY
40.0000 mg | PREFILLED_SYRINGE | INTRAMUSCULAR | Status: DC
  Administered 2022-11-29 – 2022-12-01 (×3): 40 mg via SUBCUTANEOUS
  Filled 2022-11-29 (×3): qty 0.4

## 2022-11-29 MED ORDER — ASPIRIN 81 MG PO CHEW: 81.0000 mg | CHEWABLE_TABLET | Freq: Two times a day (BID) | ORAL | Status: DC

## 2022-11-29 MED ORDER — SODIUM CHLORIDE 0.9 % IV SOLN: INTRAVENOUS | Status: AC

## 2022-11-29 MED ORDER — THIAMINE MONONITRATE 100 MG PO TABS
100.0000 mg | ORAL_TABLET | Freq: Every day | ORAL | Status: DC
  Administered 2022-12-01 – 2022-12-02 (×2): 100 mg via ORAL
  Filled 2022-11-29 (×3): qty 1

## 2022-11-29 MED ORDER — LACTATED RINGERS IV BOLUS (SEPSIS)
1000.0000 mL | Freq: Once | INTRAVENOUS | Status: AC
  Administered 2022-11-29: 1000 mL via INTRAVENOUS

## 2022-11-29 MED ORDER — INSULIN ASPART 100 UNIT/ML IJ SOLN
0.0000 [IU] | Freq: Three times a day (TID) | INTRAMUSCULAR | Status: DC
  Administered 2022-11-30 – 2022-12-02 (×5): 1 [IU] via SUBCUTANEOUS

## 2022-11-29 NOTE — Assessment & Plan Note (Signed)
S/p fixation on 7/26 -PRN IV opioids since she is having difficulty swallowing today

## 2022-11-29 NOTE — ED Notes (Signed)
Pt coughed after drinking water. This RN does not feel safe administering PO Vicodin. Dr. Particia Nearing informed and she states she will order pain meds IV.

## 2022-11-29 NOTE — H&P (Signed)
History and Physical    Patient: Brenda Pham DOB: 1941/01/12 DOA: 11/29/2022 DOS: the patient was seen and examined on 11/29/2022 PCP: Brenda Mask, MD  Patient coming from: [facility]   Chief Complaint:  Chief Complaint  Patient presents with   Altered Mental Status   HPI: Brenda Pham is a 82 y.o. female with medical history significant of dementia, Type 2 diabetes with peripheral neuropathy, HLD, hx breast cancer, osteoporosis, recent right femur fx s/p repair presents with confusion.  Patient has severe dementia and usually only oriented to self and family. Today daughter visited her at SNF and found her asleep. She was told by NP that several staff members were more concerned about her being confuse, more lethargic and not eat or drinking. Daughter denies any nausea, vomiting or diarrhea. No cough except for when attempted to drink water here in ED.   Pt just discharged on 7/29 for intramedually fixation of right femur following fracture from mechanical fall.  Currently she is complaining of pain but unable to focalize it. Not able to tell me her name.   In ED, she was afrebile, BP 160/78, HR 102. No leukocytosis. Hgb stable at 9.8 similar to prior. No electrolyte abnormality.   CT head negative. CXR without infiltrates. COVID PCR negative.  UA with large leukocytes, negative nitrate and few bacteria. Review of Systems: unable to review all systems due to the inability of the patient to answer questions. Past Medical History:  Diagnosis Date   Arthritis    osteoarthritis -knees, hands   Cancer (HCC)    '80-right breast, surgery and radiation.   Diabetes mellitus without complication (HCC)    recently dx. no oral meds or insulin.   Hyperlipidemia    PONV (postoperative nausea and vomiting)    Past Surgical History:  Procedure Laterality Date   AMPUTATION Left 11/18/2021   Procedure: LEFT 5TH AMPUTATION  RAY;  Surgeon: Toni Arthurs, MD;  Location: [facility];  Service: Orthopedics;  Laterality: Left;  local by surgeon 30 okay per Tammy   APPENDECTOMY     BREAST SURGERY     Rt. breast modified mastectomy/(reconstuction)-last '03 tranflap(tissue from abdomen); and left subcutaneoues mastectomy and implant left breast-last implant '02.   CATARACT EXTRACTION, BILATERAL     last 06-11-14 left   DILATION AND CURETTAGE OF UTERUS     FINGER SURGERY Right    right ring finger-excsion cyst and nodules.   FOOT SURGERY Left    '10- 2nd toe    INTRAMEDULLARY (IM) NAIL INTERTROCHANTERIC Right 11/25/2022   Procedure: INTRAMEDULLARY (IM) NAIL INTERTROCHANTERIC;  Surgeon: Samson Frederic, MD;  Location: [facility];  Service: Orthopedics;  Laterality: Right;   KNEE ARTHROSCOPY Right    KNEE ARTHROSCOPY Left    scope with hammer toe, '06- scope for torn cartilage   TOTAL KNEE ARTHROPLASTY Right 07/07/2014   Procedure: RIGHT TOTAL KNEE ARTHROPLASTY;  Surgeon: Durene Romans, MD;  Location: [facility];  Service: Orthopedics;  Laterality: Right;   Social History:  reports that she quit smoking about 44 years ago. Her smoking use included cigarettes. She started smoking about 54 years ago. She has a 10 pack-year smoking history. She has never used smokeless tobacco. She reports that she does not drink alcohol and does not use drugs.  Allergies  Allergen Reactions   Sulfa Antibiotics Rash   Keflex [Cephalexin]     Tolerated ceftriaxone 10/2022 admission   Niacin And Related  Extreme flushing, heart racing   Other     "Clindamycin"-Antibiotic caused thrush.    Septra [Sulfamethoxazole-Trimethoprim]     Family History  Problem Relation Age of Onset   Allergic rhinitis Daughter    Hypertension Mother    Diabetes Mother    Kidney disease Mother     Prior to Admission medications   Medication Sig Start Date End Date Taking? Authorizing Provider  aspirin (ASPIRIN CHILDRENS) 81 MG chewable tablet  Chew 1 tablet (81 mg total) by mouth 2 (two) times daily with a meal. 11/25/22 01/09/23  Hill, Alain Honey, PA-C  furosemide (LASIX) 20 MG tablet Take 40 mg by mouth in the morning.    [provider]  HYDROcodone-acetaminophen (NORCO/VICODIN) 5-325 MG tablet Take 1 tablet by mouth every 4 (four) hours as needed for up to 7 days for moderate pain or severe pain. 11/25/22 12/02/22  Clois Dupes, PA-C  lactulose (CHRONULAC) 10 GM/15ML solution Take 10 g by mouth as directed. Give 15 mls BID Every Other Day    [provider]  melatonin 3 MG TABS tablet Take 3 mg by mouth at bedtime.    [provider]  Nutritional Supplements (,FEEDING SUPPLEMENT, PROSOURCE PLUS) liquid Take 30 mLs by mouth 2 (two) times daily between meals. 11/28/22   Kathlen Mody, MD  potassium chloride (KLOR-CON) 10 MEQ tablet Take 10 mEq by mouth in the morning.    [provider]  thiamine (VITAMIN B1) 100 MG tablet Take 100 mg by mouth daily.    [provider]    Physical Exam: Vitals:   11/29/22 1715 11/29/22 1745 11/29/22 1900 11/29/22 1928  BP: (!) 149/81 (!) 140/90 110/62   Pulse: (!) 111 97    Resp: (!) 25 (!) 21 17   Temp:    97.6 F (36.4 C)  TempSrc:    Oral  SpO2: 98% 99%     Constitutional: NAD, calm, comfortable, elderly female laying flat in bed Eyes:  lids and conjunctivae normal ENMT: Mucous membranes are dry  Neck: normal, supple Respiratory: clear to auscultation bilaterally, no wheezing, no crackles. Normal respiratory effort. No accessory muscle use.  Cardiovascular: Regular rate and rhythm, no murmurs / rubs / gallops. No extremity edema.  Abdomen: soft, non-tender, non-distended  Musculoskeletal: no clubbing / cyanosis. No joint deformity upper and lower extremities. Normal muscle tone.  Skin: clean staple wound on right lateral thigh Neurologic: CN 2-12 grossly intact. Pt alert but disoriented. Not able to provide her name. Not following commands.   Psychiatric: alert but disoriented.    Data Reviewed:  See HPI  Assessment and Plan: * Acute metabolic encephalopathy -secondary to UTI with underlying dementia  -Continue IV Rocephin pending urine culture -also had difficulty swallowing in ED. Speech eval consult placed  -baseline she has severe dementia but oriented to self and family-not able to today   History of fracture of femur S/p fixation on 7/26 -PRN IV opioids since she is having difficulty swallowing today  Type 2 diabetes mellitus (HCC) -HA1C of 6.1 in May -sensitive SSI      Advance Care Planning:   Code Status: DNR   Consults: NONE  Family Communication: daughter and son-in-law at bedside  Severity of Illness: The appropriate patient status for this patient is INPATIENT. Inpatient status is judged to be reasonable and necessary in order to provide the required intensity of service to ensure the patient's safety. The patient's presenting symptoms, physical exam findings, and initial radiographic and laboratory  data in the context of their chronic comorbidities is felt to place them at high risk for further clinical deterioration. Furthermore, it is not anticipated that the patient will be medically stable for discharge from the hospital within 2 midnights of admission.   * I certify that at the point of admission it is my clinical judgment that the patient will require inpatient hospital care spanning beyond 2 midnights from the point of admission due to high intensity of service, high risk for further deterioration and high frequency of surveillance required.*  Author: Anselm Jungling, DO 11/29/2022 8:48 PM  For on call review [email or web address].

## 2022-11-29 NOTE — ED Notes (Signed)
IV attempted x2 unsuccessfully

## 2022-11-29 NOTE — Assessment & Plan Note (Signed)
-  HA1C of 6.1 in May -sensitive SSI

## 2022-11-29 NOTE — ED Provider Notes (Signed)
[place] EMERGENCY DEPARTMENT AT [facility] Provider Note   CSN: [id number] Arrival date & time: 11/29/22  1500     History  Chief Complaint  Patient presents with   Altered Mental Status    Brenda Pham is a 82 y.o. female.  Pt is a 82 yo female with pmhx significant for hld, dementia,  dm, breast cancer, and arthritis.  Pt fell and sustained a right hip fx.  She was admitted from 7/23-29 and was d/c to her facility.  Pt's family thought she was more altered today.  Pt did have a foley while she was in the hospital.  She is unable to give me any hx due to dementia.  EMS was concerned for sepsis due to tachycardia and gave her a 500 cc LR fluid bolus en route.  Pt's daughter said she was alert when she went to the facility last night.  Today, she has been in the bed all day.  She has not been eating or drinking.        Home Medications Prior to Admission medications   Medication Sig Start Date End Date Taking? Authorizing Provider  aspirin (ASPIRIN CHILDRENS) 81 MG chewable tablet Chew 1 tablet (81 mg total) by mouth 2 (two) times daily with a meal. 11/25/22 01/09/23  Hill, Alain Honey, PA-C  furosemide (LASIX) 20 MG tablet Take 40 mg by mouth in the morning.    [provider]  HYDROcodone-acetaminophen (NORCO/VICODIN) 5-325 MG tablet Take 1 tablet by mouth every 4 (four) hours as needed for up to 7 days for moderate pain or severe pain. 11/25/22 12/02/22  Clois Dupes, PA-C  lactulose (CHRONULAC) 10 GM/15ML solution Take 10 g by mouth as directed. Give 15 mls BID Every Other Day    [provider]  melatonin 3 MG TABS tablet Take 3 mg by mouth at bedtime.    [provider]  Nutritional Supplements (,FEEDING SUPPLEMENT, PROSOURCE PLUS) liquid Take 30 mLs by mouth 2 (two) times daily between meals. 11/28/22   Kathlen Mody, MD  potassium chloride (KLOR-CON) 10 MEQ tablet Take 10 mEq by mouth in the morning.    [provider]  thiamine  (VITAMIN B1) 100 MG tablet Take 100 mg by mouth daily.    [provider]      Allergies    Sulfa antibiotics, Keflex [cephalexin], Niacin and related, Other, and Septra [sulfamethoxazole-trimethoprim]    Review of Systems   Review of Systems  Unable to perform ROS: Dementia  All other systems reviewed and are negative.   Physical Exam Updated Vital Signs BP (!) 140/90   Pulse 97   Temp 99.7 F (37.6 C) (Rectal)   Resp (!) 21   SpO2 99%  Physical Exam Vitals and nursing note reviewed.  Constitutional:      Appearance: Normal appearance.  HENT:     Head: Normocephalic and atraumatic.     Right Ear: External ear normal.     Left Ear: External ear normal.     Nose: Nose normal.     Mouth/Throat:     Mouth: Mucous membranes are dry.     Pharynx: Oropharynx is clear.  Eyes:     Extraocular Movements: Extraocular movements intact.     Conjunctiva/sclera: Conjunctivae normal.     Pupils: Pupils are equal, round, and reactive to light.  Cardiovascular:     Rate and Rhythm: Normal rate and regular rhythm.     Pulses: Normal pulses.  Heart sounds: Normal heart sounds.  Pulmonary:     Effort: Pulmonary effort is normal.     Breath sounds: Normal breath sounds.  Abdominal:     General: Abdomen is flat. Bowel sounds are normal.     Palpations: Abdomen is soft.  Musculoskeletal:     Cervical back: Normal range of motion and neck supple.     Comments: Decreased rom right hip Surgical site looks ok  Skin:    Capillary Refill: Capillary refill takes less than 2 seconds.     Comments: Skin breakdown to right upper back and to sacrum  Neurological:     General: No focal deficit present.     Mental Status: She is alert.     Comments: Pt able to tell me her name.  Otherwise, she is not oriented.  She is moving all 4 extremities.     ED Results / Procedures / Treatments   Labs (all labs ordered are listed, but only abnormal results are displayed) Labs Reviewed   COMPREHENSIVE METABOLIC PANEL - Abnormal; Notable for the following components:      Result Value   Glucose, Bld 127 (*)    Calcium 8.5 (*)    Total Protein 5.9 (*)    Albumin 2.7 (*)    AST 43 (*)    All other components within normal limits  CBC WITH DIFFERENTIAL/PLATELET - Abnormal; Notable for the following components:   RBC 3.85 (*)    Hemoglobin 9.8 (*)    HCT 32.0 (*)    MCH 25.5 (*)    RDW 15.9 (*)    Abs Immature Granulocytes 0.16 (*)    All other components within normal limits  PROTIME-INR - Abnormal; Notable for the following components:   Prothrombin Time 15.5 (*)    All other components within normal limits  URINALYSIS, W/ REFLEX TO CULTURE (INFECTION SUSPECTED) - Abnormal; Notable for the following components:   APPearance HAZY (*)    Ketones, ur 5 (*)    Leukocytes,Ua LARGE (*)    Bacteria, UA FEW (*)    All other components within normal limits  CULTURE, BLOOD (ROUTINE X 2)  CULTURE, BLOOD (ROUTINE X 2)  SARS CORONAVIRUS 2 BY RT PCR  URINE CULTURE  I-STAT CG4 LACTIC ACID, ED  I-STAT CG4 LACTIC ACID, ED    EKG EKG Interpretation Date/Time:  Tuesday November 29 2022 15:27:20 EDT Ventricular Rate:  93 PR Interval:  112 QRS Duration:  87 QT Interval:  348 QTC Calculation: 433 R Axis:   61  Text Interpretation: Sinus rhythm Atrial premature complexes Borderline short PR interval No significant change since last tracing Confirmed by Jacalyn Lefevre [phone]) on 11/29/2022 3:50:48 PM  Radiology CT Head Wo Contrast  Result Date: 11/29/2022 CLINICAL DATA:  Mental status change EXAM: CT HEAD WITHOUT CONTRAST TECHNIQUE: Contiguous axial images were obtained from the base of the skull through the vertex without intravenous contrast. RADIATION DOSE REDUCTION: This exam was performed according to the departmental dose-optimization program which includes automated exposure control, adjustment of the mA and/or kV according to patient size and/or use of iterative  reconstruction technique. COMPARISON:  CT brain 11/22/2022 FINDINGS: Brain: No acute territorial infarction, hemorrhage or intracranial mass. Moderate white matter hypodensity consistent with chronic small vessel ischemic change. Nonenlarged ventricles. Vascular: No hyperdense vessels.  Carotid vascular calcification Skull: Normal. Negative for fracture or focal lesion. Sinuses/Orbits: No acute finding. Other: None IMPRESSION: 1. No CT evidence for acute intracranial abnormality. 2. Moderate chronic small vessel  ischemic changes of the white matter. Electronically Signed   By: Jasmine Pang M.D.   On: 11/29/2022 17:56   DG Chest Port 1 View  Result Date: 11/29/2022 CLINICAL DATA:  Questionable sepsis - evaluate for abnormality EXAM: PORTABLE CHEST 1 VIEW COMPARISON:  11/22/2022. FINDINGS: Redemonstration of elevated right hemidiaphragm. Bilateral lungs exhibit coarse bronchovascular markings, which are nonspecific and differential diagnosis includes interstitial lung disease, COPD, chronic fibrosis, etc. No dense consolidation or lung mass. Previously seen bilobed nodule in the right lung lower lobe is grossly unchanged. There is apparent blunting of right lateral costophrenic angle, which may be due to superimposed soft tissues versus trace pleural effusion. Left lateral costophrenic angle is clear. Stable cardio-mediastinal silhouette. No acute osseous abnormalities. The soft tissues are within normal limits. There are metallic staples along the right lower lateral chest wall. IMPRESSION: 1. Coarse bronchovascular markings, which are nonspecific and differential diagnosis includes interstitial lung disease, COPD, chronic fibrosis, etc. 2. Possible trace right pleural effusion. Electronically Signed   By: Jules Schick M.D.   On: 11/29/2022 16:00    Procedures Procedures    Medications Ordered in ED Medications  cefTRIAXone (ROCEPHIN) 1 g in sodium chloride 0.9 % 100 mL IVPB (1 g Intravenous New  Bag/Given 11/29/22 1831)  lactated ringers bolus 1,000 mL (0 mLs Intravenous Paused 11/29/22 1830)  morphine (PF) 2 MG/ML injection 2 mg (2 mg Intravenous Given 11/29/22 1749)    ED Course/ Medical Decision Making/ A&P                                 Medical Decision Making Amount and/or Complexity of Data Reviewed Labs: ordered. Radiology: ordered. ECG/medicine tests: ordered.  Risk Prescription drug management. Decision regarding hospitalization.   This patient presents to the ED for concern of ams, this involves an extensive number of treatment options, and is a complaint that carries with it a high risk of complications and morbidity.  The differential diagnosis includes sepsis, medication rxn, anemia, electrolyte abn   Co morbidities that complicate the patient evaluation  hld, dementia,  dm, breast cancer, and arthritis   Additional history obtained:  Additional history obtained from epic chart review External records from outside source obtained and reviewed including EMS report   Lab Tests:  I Ordered, and personally interpreted labs.  The pertinent results include:  cbc nl, cmp nl, inr 1.2, ua + le and >50 wbcs, lactic nl at 1.4   Imaging Studies ordered:  I ordered imaging studies including cxr, ct head  I independently visualized and interpreted imaging which showed  CXR: Coarse bronchovascular markings, which are nonspecific and  differential diagnosis includes interstitial lung disease, COPD,  chronic fibrosis, etc.  2. Possible trace right pleural effusion.  CT head: No CT evidence for acute intracranial abnormality.  2. Moderate chronic small vessel ischemic changes of the white  matter.   I agree with the radiologist interpretation   Cardiac Monitoring:  The patient was maintained on a cardiac monitor.  I personally viewed and interpreted the cardiac monitored which showed an underlying rhythm of: nsr   Medicines ordered and prescription drug  management:  I ordered medication including ivfs/rocephin  for sx  Reevaluation of the patient after these medicines showed that the patient improved I have reviewed the patients home medicines and have made adjustments as needed   Test Considered:  ct   Critical Interventions:  abx   Consultations  Obtained:  I requested consultation with the hospitalist (Dr. Cyndia Bent),  and discussed lab and imaging findings as well as pertinent plan - she will admit   Problem List / ED Course:  Metabolic encephalopathy:  pt does have a UTI which is the likely source.  She is given IV rocephin.  She is unable to swallow water which is a new for pt.   Decubitus ulcers:  pt has not been getting up much.  Wound care will need to be done.   Reevaluation:  After the interventions noted above, I reevaluated the patient and found that they have :improved   Social Determinants of Health:  Lives in ALF   Dispostion:  After consideration of the diagnostic results and the patients response to treatment, I feel that the patent would benefit from admission.          Final Clinical Impression(s) / ED Diagnoses Final diagnoses:  Acute metabolic encephalopathy  Acute cystitis without hematuria  Pressure injury of skin of sacral region, unspecified injury stage  Pressure injury of skin of back, unspecified injury stage    Rx / DC Orders ED Discharge Orders     None         Jacalyn Lefevre, MD 11/29/22 1849

## 2022-11-29 NOTE — ED Notes (Signed)
Loleta Books, NT at bedside attempting blood cultures.

## 2022-11-29 NOTE — ED Notes (Signed)
Pt has stage 3 pressure wound on sacrum and various wounds on back. EDP aware.

## 2022-11-29 NOTE — ED Notes (Signed)
Family at bedside and EDP aware.

## 2022-11-29 NOTE — ED Notes (Signed)
ED TO INPATIENT HANDOFF REPORT  ED Nurse Name and Phone #: Scheryl Marten RN, [phone]  S Name/Age/Gender Brenda Pham 82 y.o. female Room/Bed: 029C/029C  Code Status   Code Status: DNR  Home/SNF/Other Skilled nursing facility Patient oriented to: self Is this baseline? Yes   Triage Complete: Triage complete  Chief Complaint Acute metabolic encephalopathy [G93.41]  Triage Note Pt bib ems from [facility] SN[facility] c/o AMS. Pt was noted to be different than her normal self this AM and speech was more slurred than normal. While staff was changing pt brief a strong ammonia smell was noted. Staff states pt was more altered after receiving pain medication. Pt was recently d/c from [facility]  for right femoral fx and pt had foley while admitted. Pt has hx Advanced Dementia  BP 153/79 HR 115-120 CBG 140 Capnography 25 RA 100  Pt received 500 cc LR   Allergies Allergies  Allergen Reactions   Sulfa Antibiotics Rash   Keflex [Cephalexin]     Tolerated ceftriaxone 10/2022 admission   Niacin And Related     Extreme flushing, heart racing   Other     "Clindamycin"-Antibiotic caused thrush.    Septra [Sulfamethoxazole-Trimethoprim]     Level of Care/Admitting Diagnosis ED Disposition     ED Disposition  Admit   Condition  --   Comment  Hospital Area: [facility] [[id number]]  Level of Care: Telemetry Medical [104]  May admit patient to [facility] or [facility] if equivalent level of care is available:: No  Covid Evaluation: Asymptomatic - no recent exposure (last 10 days) testing not required  Diagnosis: Acute metabolic encephalopathy [5885027]  Admitting Physician: Anselm Jungling [[id number]]  Attending Physician: Anselm Jungling [[id number]]  Certification:: I certify this patient will need inpatient services for at least 2 midnights  Estimated Length of Stay: 2          B Medical/Surgery History Past Medical History:  Diagnosis Date   Arthritis     osteoarthritis -knees, hands   Cancer (HCC)    '80-right breast, surgery and radiation.   Diabetes mellitus without complication (HCC)    recently dx. no oral meds or insulin.   Hyperlipidemia    PONV (postoperative nausea and vomiting)    Past Surgical History:  Procedure Laterality Date   AMPUTATION Left 11/18/2021   Procedure: LEFT 5TH AMPUTATION RAY;  Surgeon: Toni Arthurs, MD;  Location: [facility];  Service: Orthopedics;  Laterality: Left;  local by surgeon 30 okay per Tammy   APPENDECTOMY     BREAST SURGERY     Rt. breast modified mastectomy/(reconstuction)-last '03 tranflap(tissue from abdomen); and left subcutaneoues mastectomy and implant left breast-last implant '02.   CATARACT EXTRACTION, BILATERAL     last 06-11-14 left   DILATION AND CURETTAGE OF UTERUS     FINGER SURGERY Right    right ring finger-excsion cyst and nodules.   FOOT SURGERY Left    '10- 2nd toe    INTRAMEDULLARY (IM) NAIL INTERTROCHANTERIC Right 11/25/2022   Procedure: INTRAMEDULLARY (IM) NAIL INTERTROCHANTERIC;  Surgeon: Samson Frederic, MD;  Location: [facility];  Service: Orthopedics;  Laterality: Right;   KNEE ARTHROSCOPY Right    KNEE ARTHROSCOPY Left    scope with hammer toe, '06- scope for torn cartilage   TOTAL KNEE ARTHROPLASTY Right 07/07/2014   Procedure: RIGHT TOTAL KNEE ARTHROPLASTY;  Surgeon: Durene Romans, MD;  Location: [facility];  Service: Orthopedics;  Laterality: Right;     A IV  Location/Drains/Wounds Patient Lines/Drains/Airways Status     Active Line/Drains/Airways     Name Placement date Placement time Site Days   Peripheral IV 11/29/22 Posterior;Right Forearm 11/29/22  1545  Forearm  less than 1            Intake/Output Last 24 hours No intake or output data in the 24 hours ending 11/29/22 2049  Labs/Imaging Results for orders placed or performed during the hospital encounter of 11/29/22 (from the past 48 hour(s))  Comprehensive metabolic panel     Status:  Abnormal   Collection Time: 11/29/22  3:45 PM  Result Value Ref Range   Sodium 135 135 - 145 mmol/L   Potassium 4.1 3.5 - 5.1 mmol/L   Chloride 104 98 - 111 mmol/L   CO2 22 22 - 32 mmol/L   Glucose, Bld 127 (H) 70 - 99 mg/dL    Comment: Glucose reference range applies only to samples taken after fasting for at least 8 hours.   BUN 13 8 - 23 mg/dL   Creatinine, Ser 9.51 0.44 - 1.00 mg/dL   Calcium 8.5 (L) 8.9 - 10.3 mg/dL   Total Protein 5.9 (L) 6.5 - 8.1 g/dL   Albumin 2.7 (L) 3.5 - 5.0 g/dL   AST 43 (H) 15 - 41 U/L   ALT 35 0 - 44 U/L   Alkaline Phosphatase 82 38 - 126 U/L   Total Bilirubin 1.1 0.3 - 1.2 mg/dL   GFR, Estimated >88 >41 mL/min    Comment: (NOTE) Calculated using the CKD-EPI Creatinine Equation (2021)    Anion gap 9 5 - 15    Comment: Performed at [facility], [address] [place], [place] [address]  CBC with Differential     Status: Abnormal   Collection Time: 11/29/22  3:45 PM  Result Value Ref Range   WBC 6.9 4.0 - 10.5 K/uL   RBC 3.85 (L) 3.87 - 5.11 MIL/uL   Hemoglobin 9.8 (L) 12.0 - 15.0 g/dL   HCT 01.6 (L) 01.0 - 93.2 %   MCV 83.1 80.0 - 100.0 fL   MCH 25.5 (L) 26.0 - 34.0 pg   MCHC 30.6 30.0 - 36.0 g/dL   RDW 35.5 (H) 73.2 - 20.2 %   Platelets 247 150 - 400 K/uL   nRBC 0.0 0.0 - 0.2 %   Neutrophils Relative % 70 %   Neutro Abs 4.8 1.7 - 7.7 K/uL   Lymphocytes Relative 16 %   Lymphs Abs 1.1 0.7 - 4.0 K/uL   Monocytes Relative 9 %   Monocytes Absolute 0.6 0.1 - 1.0 K/uL   Eosinophils Relative 2 %   Eosinophils Absolute 0.1 0.0 - 0.5 K/uL   Basophils Relative 1 %   Basophils Absolute 0.0 0.0 - 0.1 K/uL   Immature Granulocytes 2 %   Abs Immature Granulocytes 0.16 (H) 0.00 - 0.07 K/uL    Comment: Performed at [facility], [address] [place], [place] [address]  Protime-INR     Status: Abnormal   Collection Time: 11/29/22  3:45 PM  Result Value Ref Range   Prothrombin Time 15.5 (H) 11.4 - 15.2 seconds   INR 1.2 0.8 - 1.2     Comment: (NOTE) INR goal varies based on device and disease states. Performed at [facility], [address] [place], [place] [address]   Urinalysis, w/ Reflex to Culture (Infection Suspected) -Urine, Catheterized     Status: Abnormal   Collection Time: 11/29/22  3:45 PM  Result Value Ref Range   Specimen Source URINE, CATHETERIZED    Color, Urine YELLOW YELLOW   APPearance HAZY (A) CLEAR   Specific Gravity, Urine 1.016 1.005 - 1.030   pH 7.0 5.0 - 8.0   Glucose, UA NEGATIVE NEGATIVE mg/dL   Hgb urine dipstick NEGATIVE NEGATIVE   Bilirubin Urine NEGATIVE NEGATIVE   Ketones, ur 5 (A) NEGATIVE mg/dL   Protein, ur NEGATIVE NEGATIVE mg/dL   Nitrite NEGATIVE NEGATIVE   Leukocytes,Ua LARGE (A) NEGATIVE   RBC / HPF 0-5 0 - 5 RBC/hpf   WBC, UA >50 0 - 5 WBC/hpf    Comment:        Reflex urine culture not performed if WBC <=10, OR if Squamous epithelial cells >5. If Squamous epithelial cells >5 suggest recollection.    Bacteria, UA FEW (A) NONE SEEN   Squamous Epithelial / HPF 0-5 0 - 5 /HPF   Mucus PRESENT     Comment: Performed at [facility], [address], [place], [place] [address]  Urine Culture     Status: None (Preliminary result)   Collection Time: 11/29/22  3:45 PM   Specimen: Urine, Catheterized  Result Value Ref Range   Specimen Description URINE, CATHETERIZED    Special Requests      NONE Reflexed from [id number] Performed at [facility], [address], [place], [place] [address]    Culture PENDING    Report Status PENDING   I-Stat Lactic Acid, ED     Status: None   Collection Time: 11/29/22  4:11 PM  Result Value Ref Range   Lactic Acid, Venous 1.4 0.5 - 1.9 mmol/L  SARS Coronavirus 2 by RT PCR (hospital order, performed in [facility] hospital lab) *cepheid single result test* Anterior Nasal Swab     Status: None   Collection Time: 11/29/22  5:54 PM   Specimen: Anterior Nasal Swab  Result Value Ref Range   SARS Coronavirus 2 by RT PCR  NEGATIVE NEGATIVE    Comment: Performed at [facility], [address], [place], [place] [address]   CT Head Wo Contrast  Result Date: 11/29/2022 CLINICAL DATA:  Mental status change EXAM: CT HEAD WITHOUT CONTRAST TECHNIQUE: Contiguous axial images were obtained from the base of the skull through the vertex without intravenous contrast. RADIATION DOSE REDUCTION: This exam was performed according to the departmental dose-optimization program which includes automated exposure control, adjustment of the mA and/or kV according to patient size and/or use of iterative reconstruction technique. COMPARISON:  CT brain 11/22/2022 FINDINGS: Brain: No acute territorial infarction, hemorrhage or intracranial mass. Moderate white matter hypodensity consistent with chronic small vessel ischemic change. Nonenlarged ventricles. Vascular: No hyperdense vessels.  Carotid vascular calcification Skull: Normal. Negative for fracture or focal lesion. Sinuses/Orbits: No acute finding. Other: None IMPRESSION: 1. No CT evidence for acute intracranial abnormality. 2. Moderate chronic small vessel ischemic changes of the white matter. Electronically Signed   By: Jasmine Pang M.D.   On: 11/29/2022 17:56   DG Chest Port 1 View  Result Date: 11/29/2022 CLINICAL DATA:  Questionable sepsis - evaluate for abnormality EXAM: PORTABLE CHEST 1 VIEW COMPARISON:  11/22/2022. FINDINGS: Redemonstration of elevated right hemidiaphragm. Bilateral lungs exhibit coarse bronchovascular markings, which are nonspecific and differential diagnosis includes interstitial lung disease, COPD, chronic fibrosis, etc. No dense consolidation or lung mass. Previously seen bilobed nodule in the right lung lower lobe is grossly unchanged. There is apparent blunting of right  lateral costophrenic angle, which may be due to superimposed soft tissues versus trace pleural effusion. Left lateral costophrenic angle is clear. Stable cardio-mediastinal silhouette. No  acute osseous abnormalities. The soft tissues are within normal limits. There are metallic staples along the right lower lateral chest wall. IMPRESSION: 1. Coarse bronchovascular markings, which are nonspecific and differential diagnosis includes interstitial lung disease, COPD, chronic fibrosis, etc. 2. Possible trace right pleural effusion. Electronically Signed   By: Jules Schick M.D.   On: 11/29/2022 16:00    Pending Labs Unresulted Labs (From admission, onward)     Start     Ordered   11/30/22 0500  CBC  Tomorrow morning,   R        11/29/22 2034   11/30/22 0500  Basic metabolic panel  Tomorrow morning,   R        11/29/22 2034   11/29/22 1516  Blood Culture (routine x 2)  (Undifferentiated presentation (screening labs and basic nursing orders))  BLOOD CULTURE X 2,   STAT      11/29/22 1516            Vitals/Pain Today's Vitals   11/29/22 1715 11/29/22 1745 11/29/22 1900 11/29/22 1928  BP: (!) 149/81 (!) 140/90 110/62   Pulse: (!) 111 97    Resp: (!) 25 (!) 21 17   Temp:    97.6 F (36.4 C)  TempSrc:    Oral  SpO2: 98% 99%      Isolation Precautions No active isolations  Medications Medications  0.9 %  sodium chloride infusion (has no administration in time range)  cefTRIAXone (ROCEPHIN) 1 g in sodium chloride 0.9 % 100 mL IVPB (has no administration in time range)  morphine (PF) 2 MG/ML injection 2 mg (has no administration in time range)  enoxaparin (LOVENOX) injection 40 mg (has no administration in time range)  insulin aspart (novoLOG) injection 0-9 Units (has no administration in time range)  lactated ringers bolus 1,000 mL (0 mLs Intravenous Stopped 11/29/22 2026)  cefTRIAXone (ROCEPHIN) 1 g in sodium chloride 0.9 % 100 mL IVPB (0 g Intravenous Stopped 11/29/22 1903)  morphine (PF) 2 MG/ML injection 2 mg (2 mg Intravenous Given 11/29/22 1749)    Mobility non-ambulatory     Focused Assessments Neuro Assessment Handoff:  Swallow screen pass? No           Neuro Assessment: Exceptions to WDL Neuro Checks:      Has TPA been given? No If patient is a Neuro Trauma and patient is going to OR before floor call report to 4N Charge nurse: [phone] or [phone]   R Recommendations: See Admitting Provider Note  Report given to:   Additional Notes:  Baseline neuro status is orientation to self. Still oriented to self, but has progressively been worsening as far as unable to hold a conversation and speaking in what family states as "gibberish". Incontinent at baseline. Coming from SNF prior to recent admission at [facility] from R hip injury. Able to static stand with 2 person assist, but no longer really walks.

## 2022-11-29 NOTE — ED Notes (Signed)
Delay in obtaining blood cultures.

## 2022-11-29 NOTE — Assessment & Plan Note (Signed)
-  secondary to UTI with underlying dementia  -Continue IV Rocephin pending urine culture -also had difficulty swallowing in ED. Speech eval consult placed  -baseline she has severe dementia but oriented to self and family-not able to today

## 2022-11-29 NOTE — ED Triage Notes (Signed)
Pt bib ems from [facility] SNF c/o AMS. Pt was noted to be different than her normal self this AM and speech was more slurred than normal. While staff was changing pt brief a strong ammonia smell was noted. Staff states pt was more altered after receiving pain medication. Pt was recently d/c from [facility]  for right femoral fx and pt had foley while admitted. Pt has hx Advanced Dementia  BP 153/79 HR 115-120 CBG 140 Capnography 25 RA 100  Pt received 500 cc LR

## 2022-11-29 NOTE — ED Notes (Signed)
Patient transported to CT 

## 2022-11-30 DIAGNOSIS — D649 Anemia, unspecified: Secondary | ICD-10-CM

## 2022-11-30 DIAGNOSIS — L89109 Pressure ulcer of unspecified part of back, unspecified stage: Secondary | ICD-10-CM | POA: Diagnosis not present

## 2022-11-30 DIAGNOSIS — N3000 Acute cystitis without hematuria: Secondary | ICD-10-CM | POA: Diagnosis not present

## 2022-11-30 DIAGNOSIS — G9341 Metabolic encephalopathy: Secondary | ICD-10-CM | POA: Diagnosis not present

## 2022-11-30 LAB — GLUCOSE, CAPILLARY
Glucose-Capillary: 116 mg/dL — ABNORMAL HIGH (ref 70–99)
Glucose-Capillary: 116 mg/dL — ABNORMAL HIGH (ref 70–99)
Glucose-Capillary: 130 mg/dL — ABNORMAL HIGH (ref 70–99)
Glucose-Capillary: 122 mg/dL — ABNORMAL HIGH (ref 70–99)
Glucose-Capillary: 128 mg/dL — ABNORMAL HIGH (ref 70–99)

## 2022-11-30 MED ORDER — SODIUM CHLORIDE 0.9 % IV SOLN: INTRAVENOUS | Status: AC

## 2022-11-30 MED ORDER — OXYCODONE HCL 5 MG PO TABS
5.0000 mg | ORAL_TABLET | Freq: Four times a day (QID) | ORAL | Status: DC | PRN
  Administered 2022-11-30 – 2022-12-02 (×2): 5 mg via ORAL
  Filled 2022-11-30 (×2): qty 1

## 2022-11-30 MED ORDER — MORPHINE SULFATE (PF) 2 MG/ML IV SOLN
2.0000 mg | INTRAVENOUS | Status: DC | PRN
  Administered 2022-11-30: 2 mg via INTRAVENOUS

## 2022-11-30 MED ORDER — ACETAMINOPHEN 325 MG PO TABS
650.0000 mg | ORAL_TABLET | Freq: Four times a day (QID) | ORAL | Status: DC | PRN
  Administered 2022-11-30 – 2022-12-02 (×3): 650 mg via ORAL
  Filled 2022-11-30 (×3): qty 2

## 2022-11-30 NOTE — NC FL2 (Signed)
[place] MEDICAID FL2 LEVEL OF CARE FORM     IDENTIFICATION  Patient Name: Brenda Pham Birthdate: [birth year] Sex: female Admission Date (Current Location): 11/29/2022  [facility] and [place] Number:  Producer, television/film/video and Address:  [facility], [address], [place], [place] [address]      Provider Number:    Attending Physician Name and Address:  Osvaldo Shipper, MD  Relative Name and Phone Number:  daughter, Kennith Gain @ [phone]    Current Level of Care:   Recommended Level of Care: Skilled Nursing Facility Prior Approval Number:    Date Approved/Denied:   PASRR Number: [id number]  Discharge Plan: SNF    Current Diagnoses: Patient Active Problem List   Diagnosis Date Noted   Acute metabolic encephalopathy 11/29/2022   UTI (urinary tract infection) 11/29/2022   History of fracture of femur 11/29/2022   Edema of right lower extremity 11/23/2022   Normocytic anemia 11/23/2022   Hip fracture (HCC) 11/22/2022   Acute encephalopathy 09/17/2022   Type 2 diabetes mellitus (HCC) 09/17/2022   Neutropenia (HCC) 09/17/2022   Thrombocytopenia (HCC) 09/17/2022   Tailor's bunion of left foot 10/27/2021   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   Abscess of left foot 03/01/2021   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) 12/28/2020   History of adenomatous polyp of colon 08/30/2017   Hyperlipidemia 08/02/2017   Internal hemorrhoids 08/02/2017   Allergic reaction 04/04/2016   Drug allergy 03/28/2016   Overweight (BMI 25.0-29.9) 07/09/2014   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/07/2014    Orientation RESPIRATION BLADDER Height & Weight     Self  Normal Incontinent Weight:   Height:     BEHAVIORAL SYMPTOMS/MOOD NEUROLOGICAL BOWEL NUTRITION STATUS      Incontinent Diet (See d/c summary)  AMBULATORY STATUS COMMUNICATION OF NEEDS Skin   Extensive Assist Verbally Surgical wounds (surgical  incision only)                       Personal Care Assistance Level of Assistance  Bathing, Feeding, Dressing Bathing Assistance: Limited assistance Feeding assistance: Limited assistance Dressing Assistance: Limited assistance     Functional Limitations Info  Sight, Hearing, Speech Sight Info: Impaired Hearing Info: Adequate Speech Info: Adequate    SPECIAL CARE FACTORS FREQUENCY  PT (By licensed PT), OT (By licensed OT)     PT Frequency: 5x/week OT Frequency: 5x/week            Contractures Contractures Info: Not present    Additional Factors Info  Code Status, Allergies, Psychotropic Code Status Info: DNR Allergies Info: Sulfa Antibiotics, Keflex (Cephalexin), Niacin And Related, Other, Septra (Sulfamethoxazole-trimethoprim) Psychotropic Info: see MAR         Current Medications (11/30/2022):  This is the current hospital active medication list Current Facility-Administered Medications  Medication Dose Route Frequency Provider Last Rate Last Admin   0.9 %  sodium chloride infusion   Intravenous Continuous Osvaldo Shipper, MD       cefTRIAXone (ROCEPHIN) 1 g in sodium chloride 0.9 % 100 mL IVPB  1 g Intravenous Q24H Tu, Ching T, DO       enoxaparin (LOVENOX) injection 40 mg  40 mg Subcutaneous Q24H Tu, Ching T, DO   40 mg at 11/29/22 2252   insulin aspart (novoLOG) injection 0-9 Units  0-9 Units Subcutaneous TID WC Tu, Ching T, DO       morphine (  PF) 2 MG/ML injection 2 mg  2 mg Intravenous Q4H PRN Tu, Ching T, DO   2 mg at 11/30/22 0532   thiamine (VITAMIN B1) tablet 100 mg  100 mg Oral Daily Tu, Ching T, DO         Discharge Medications: Please see discharge summary for a list of discharge medications.  Relevant Imaging Results:  Relevant Lab Results:   Additional Information SS#[id number]  Erin Sons, LCSW

## 2022-11-30 NOTE — TOC Initial Note (Signed)
Transition of Care [facility]) - Initial/Assessment Note    Patient Details  Name: Brenda Pham MRN: [id number] Date of Birth: 1941/04/22  [facility] CM/SW Contact:    Erin Sons, LCSW Phone Number: 11/30/2022, 11:04 AM  Clinical Narrative:                  CSW called pt's daughter to discuss disposition. Daughter states that pt was at [facility] for rehab from 5/22-7/20 and then transitioned to their Memory care. Pt was there for a short time before falling and breaking and being readmitted. Pt then discharged to [facility] SNF for more rehab on 7/29 until she was readmitted to hospital yesterday. Daughter agreeable to pt returning to SNF to continue rehab once pt is medically stable.   CSW confirmed with [facility] that pt from the rehab; plan is to return.   Expected Discharge Plan: Skilled Nursing Facility Barriers to Discharge: Continued Medical Work up                      Prior Living Arrangements/Services     Patient language and need for interpreter reviewed:: Yes        Need for Family Participation in Patient Care: Yes (Comment) Care giver support system in place?: Yes (comment)   Criminal Activity/Legal Involvement Pertinent to Current Situation/Hospitalization: No - Comment as needed           Emotional Assessment       Orientation: : Oriented to Self Alcohol / Substance Use: Not Applicable Psych Involvement: No (comment)  Admission diagnosis:  Acute cystitis without hematuria [N30.00] Acute metabolic encephalopathy [G93.41] Pressure injury of skin of back, unspecified injury stage [L89.109] Pressure injury of skin of sacral region, unspecified injury stage [L89.159] Patient Active Problem List   Diagnosis Date Noted   Acute metabolic encephalopathy 11/29/2022   UTI (urinary tract infection) 11/29/2022   History of fracture of femur 11/29/2022   Edema of right lower extremity 11/23/2022   Normocytic anemia 11/23/2022   Hip  fracture (HCC) 11/22/2022   Acute encephalopathy 09/17/2022   Type 2 diabetes mellitus (HCC) 09/17/2022   Neutropenia (HCC) 09/17/2022   Thrombocytopenia (HCC) 09/17/2022   Tailor's bunion of left foot 10/27/2021   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   Abscess of left foot 03/01/2021   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) 12/28/2020   History of adenomatous polyp of colon 08/30/2017   Hyperlipidemia 08/02/2017   Internal hemorrhoids 08/02/2017   Allergic reaction 04/04/2016   Drug allergy 03/28/2016   Overweight (BMI 25.0-29.9) 07/09/2014   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/07/2014   PCP:  Kaleen Mask, MD Pharmacy:   [id number] - [place], [place] - [address] AT [facility] OF [address] [place] [address] Phone: [phone] Fax: [phone]  [id number] [place] - [address] AT [facility] OF [address] [place] [address] Phone: [phone] Fax: [phone]     Social Determinants of Health (SDOH) Social History: SDOH Screenings   Food Insecurity: No Food Insecurity (11/23/2022)  Housing: Low Risk  (11/23/2022)  Transportation Needs: No Transportation Needs (11/23/2022)  Utilities: Not At Risk (11/23/2022)  Tobacco Use: Medium Risk (11/29/2022)   SDOH Interventions:     Readmission Risk Interventions    11/23/2022  1:22 PM 09/20/2022    2:11 PM  Readmission Risk Prevention Plan  Post Dischage Appt Complete Complete  Medication Screening Complete Complete  Transportation Screening Complete Complete

## 2022-11-30 NOTE — Evaluation (Signed)
Clinical/Bedside Swallow Evaluation Patient Details  Name: Brenda Pham MRN: [id number] Date of Birth: 1940/09/25  Today's Date: 11/30/2022 Time: SLP Start Time (ACUTE ONLY): 0920 SLP Stop Time (ACUTE ONLY): 0950 SLP Time Calculation (min) (ACUTE ONLY): 30 min  Past Medical History:  Past Medical History:  Diagnosis Date   Arthritis    osteoarthritis -knees, hands   Cancer (HCC)    '80-right breast, surgery and radiation.   Diabetes mellitus without complication (HCC)    recently dx. no oral meds or insulin.   Hyperlipidemia    PONV (postoperative nausea and vomiting)    Past Surgical History:  Past Surgical History:  Procedure Laterality Date   AMPUTATION Left 11/18/2021   Procedure: LEFT 5TH AMPUTATION RAY;  Surgeon: Toni Arthurs, MD;  Location: [facility];  Service: Orthopedics;  Laterality: Left;  local by surgeon 30 okay per Tammy   APPENDECTOMY     BREAST SURGERY     Rt. breast modified mastectomy/(reconstuction)-last '03 tranflap(tissue from abdomen); and left subcutaneoues mastectomy and implant left breast-last implant '02.   CATARACT EXTRACTION, BILATERAL     last 06-11-14 left   DILATION AND CURETTAGE OF UTERUS     FINGER SURGERY Right    right ring finger-excsion cyst and nodules.   FOOT SURGERY Left    '10- 2nd toe    INTRAMEDULLARY (IM) NAIL INTERTROCHANTERIC Right 11/25/2022   Procedure: INTRAMEDULLARY (IM) NAIL INTERTROCHANTERIC;  Surgeon: Samson Frederic, MD;  Location: [facility];  Service: Orthopedics;  Laterality: Right;   KNEE ARTHROSCOPY Right    KNEE ARTHROSCOPY Left    scope with hammer toe, '06- scope for torn cartilage   TOTAL KNEE ARTHROPLASTY Right 07/07/2014   Procedure: RIGHT TOTAL KNEE ARTHROPLASTY;  Surgeon: Durene Romans, MD;  Location: [facility];  Service: Orthopedics;  Laterality: Right;   HPI:  Patient is an 82 y.o. female with PMH: severe dementia, DM-2, peripheral neuropathy, HLD, breast cancer, osteoporosis, recent femur  fracture. Patient had been at [facility] rehab starting in 09/21/22 and per daughter, she had just transitioned to memory care three days before current admission. Staff members at memory care faciilty were concerned as she was more lethargic and not eating or drinking. CT head negative, CXR without infiltrates.    Assessment / Plan / Recommendation  Clinical Impression  Patient presents with what appears to be a cognitive-based dysphagia as per this bedside swallow evaluation. She refused PO's from SLP, however her daughter then arrived and she did accept PO's from her. Per daughter, patient was able to feed self at [facility] rehab facility and did not indicate any h/o dysphagia. Daughter did report that just prior to this admission, patient was coughing a little with PO's. Patient's swallow function assessed with PO's of following consistencies: thin liquids, puree solids. At times, she exhibited poor awareness to boluses, resulting in some anterior spillage and discoordinated swallow. SLP did obseve patient to have a mild, delayed cough response with a sip of soda and after a bite of ice cream, however this was not consistently occuring. SLP recommending initiate PO diet of Dys 3 (mechanical soft) solids and thin liquids and will follow for toleration. SLP Visit Diagnosis: Dysphagia, unspecified (R13.10)    Aspiration Risk  Mild aspiration risk    Diet Recommendation Dysphagia 3 (Mech soft);Thin liquid    Liquid Administration via: Cup;Straw Medication Administration: Whole meds with puree Supervision: Full supervision/cueing for compensatory strategies;Staff to assist with self feeding Compensations: Slow rate;Small sips/bites Postural Changes: Seated upright at  90 degrees    Other  Recommendations Oral Care Recommendations: Oral care BID    Recommendations for follow up therapy are one component of a multi-disciplinary discharge planning process, led by the attending physician.   Recommendations may be updated based on patient status, additional functional criteria and insurance authorization.  Follow up Recommendations Follow physician's recommendations for discharge plan and follow up therapies      Assistance Recommended at Discharge    Functional Status Assessment Patient has had a recent decline in their functional status and demonstrates the ability to make significant improvements in function in a reasonable and predictable amount of time.  Frequency and Duration min 2x/week  1 week       Prognosis Prognosis for improved oropharyngeal function: Good Barriers to Reach Goals: Cognitive deficits      Swallow Study   General Date of Onset: 11/29/22 HPI: Patient is an 82 y.o. female with PMH: severe dementia, DM-2, peripheral neuropathy, HLD, breast cancer, osteoporosis, recent femur fracture. Patient had been at [facility] rehab starting in 09/21/22 and per daughter, she had just transitioned to memory care three days before current admission. Staff members at memory care faciilty were concerned as she was more lethargic and not eating or drinking. CT head negative, CXR without infiltrates. Type of Study: Bedside Swallow Evaluation Previous Swallow Assessment: none found Diet Prior to this Study: NPO Temperature Spikes Noted: No Respiratory Status: Room air History of Recent Intubation: No Behavior/Cognition: Alert;Confused;Requires cueing;Doesn't follow directions Oral Cavity Assessment: Dry Oral Care Completed by SLP: No Oral Cavity - Dentition: Adequate natural dentition Self-Feeding Abilities: Total assist Patient Positioning: Upright in bed Baseline Vocal Quality: Normal Volitional Cough: Cognitively unable to elicit Volitional Swallow: Unable to elicit    Oral/Motor/Sensory Function Overall Oral Motor/Sensory Function: Within functional limits   Ice Chips     Thin Liquid Thin Liquid: Impaired Presentation: Straw Oral Phase Impairments: Poor  awareness of bolus Pharyngeal  Phase Impairments: Suspected delayed Swallow    Nectar Thick     Honey Thick     Puree Puree: Impaired Oral Phase Impairments: Poor awareness of bolus Pharyngeal Phase Impairments: Suspected delayed Swallow;Cough - Delayed   Solid     Solid: Not tested     Angela Nevin, MA, CCC-SLP Speech Therapy

## 2022-11-30 NOTE — Progress Notes (Signed)
[facility] PROGRESS NOTE   Brenda Pham ZOX:[id number] DOB: 1940/06/09 DOA: 11/29/2022  PCP: Kaleen Mask, MD  Brief History/Interval Summary: 82 y.o. female with medical history significant of dementia, Type 2 diabetes with peripheral neuropathy, HLD, hx breast cancer, osteoporosis, recent right femur fx s/p repair presented with confusion. Patient has severe dementia and usually only oriented to self and family.  She was at a skilled nursing facility after recent hospitalization for femur fracture.  Daughter found her to be more somnolent.  She was subsequently brought into the emergency department.  Concern was raised for UTI.  She was hospitalized.  Concern was also raised for possible dysphagia.    Consultants: None  Procedures: None    Subjective/Interval History: Patient pleasantly confused.  Does not appear to be in any discomfort or distress.    Assessment/Plan:  Acute metabolic encephalopathy Possibly secondary to UTI in the setting of underlying dementia.  Continue to treat infection.  Reorient daily.  PT and OT evaluation.  Urinary tract infection Follow cultures.  Continue ceftriaxone.  History of dementia Continue to monitor.  Recent right femur fracture Status post fixation on 7/26.  PT and OT evaluation.  Concern for dysphagia Await SLP evaluation.  Diabetes mellitus type 2 HbA1c was 6.1 in May.  Continue SSI.   Normocytic anemia Hemoglobin is stable.  No evidence of overt bleeding.   Stage II decubitus involving coccyx. POA Pressure Injury 11/29/22 Coccyx Mid Stage 2 -  Partial thickness loss of dermis presenting as a shallow open injury with a red, pink wound bed without slough. (Active)  11/29/22 2215  Location: Coccyx  Location Orientation: Mid  Staging: Stage 2 -  Partial thickness loss of dermis presenting as a shallow open injury with a red, pink wound bed without slough.  Wound Description (Comments):   Present on  Admission: Yes    DVT Prophylaxis: Lovenox Code Status: DNR Family Communication: No family at bedside Disposition Plan:   Return to SNF when improved  Status is: Inpatient Remains inpatient appropriate because:  acute mental altered mental status, UTI      Medications: Scheduled:  enoxaparin (LOVENOX) injection  40 mg Subcutaneous Q24H   insulin aspart  0-9 Units Subcutaneous TID WC   thiamine  100 mg Oral Daily   Continuous:  cefTRIAXone (ROCEPHIN)  IV     WJX:BJYNWGNF injection  Antibiotics: Anti-infectives (From admission, onward)    Start     Dose/Rate Route Frequency Ordered Stop   11/30/22 1800  cefTRIAXone (ROCEPHIN) 1 g in sodium chloride 0.9 % 100 mL IVPB        1 g 200 mL/hr over 30 Minutes Intravenous Every 24 hours 11/29/22 2015     11/29/22 1700  cefTRIAXone (ROCEPHIN) 1 g in sodium chloride 0.9 % 100 mL IVPB        1 g 200 mL/hr over 30 Minutes Intravenous  Once 11/29/22 1645 11/29/22 1903       Objective:  Vital Signs  Vitals:   11/29/22 2030 11/29/22 2203 11/30/22 0317 11/30/22 0742  BP: 99/88 (!) 143/74 (!) 143/77 (!) 140/69  Pulse: (!) 106 98 (!) 108 94  Resp: (!) 25 20 20 18   Temp:  98.2 F (36.8 C) 98.2 F (36.8 C) 98.7 F (37.1 C)  TempSrc:  Oral Oral Oral  SpO2: 100%  100% 100%    Intake/Output Summary (Last 24 hours) at 11/30/2022 0955 Last data filed at 11/30/2022 0600 Gross per 24 hour  Intake 533.45  ml  Output 600 ml  Net -66.55 ml   There were no vitals filed for this visit.  General appearance: Awake alert.  In no distress.  Pleasantly confused Resp: Clear to auscultation bilaterally.  Normal effort Cardio: S1-S2 is normal regular.  No S3-S4.  No rubs murmurs or bruit GI: Abdomen is soft.  Nontender nondistended.  Bowel sounds are present normal.  No masses organomegaly Extremities: No edema.  Physical deconditioning noted.  Limited range of motion of bilateral lower extremities.   Lab Results:  Data Reviewed: I have  personally reviewed following labs and reports of the imaging studies  CBC: Recent Labs  Lab 11/26/22 0952 11/27/22 0339 11/28/22 0720 11/29/22 1545 11/29/22 2320  WBC 9.9 6.9 6.5 6.9 6.8  NEUTROABS  --   --   --  4.8  --   HGB 9.3* 9.4* 10.4* 9.8* 9.6*  HCT 30.0* 30.2* 32.7* 32.0* 30.4*  MCV 85.0 84.4 84.1 83.1 82.8  PLT 186 163 211 247 224    Basic Metabolic Panel: Recent Labs  Lab 11/26/22 0952 11/27/22 0339 11/28/22 0720 11/29/22 1545 11/29/22 2320  NA 137 135 137 135 134*  K 3.8 3.8 4.0 4.1 3.9  CL 105 105 105 104 104  CO2 23 23 24 22  20*  GLUCOSE 145* 112* 147* 127* 125*  BUN 17 17 16 13 11   CREATININE 0.60 0.65 0.60 0.63 0.61  CALCIUM 8.0* 7.9* 8.5* 8.5* 8.4*    GFR: Estimated Creatinine Clearance: 48.8 mL/min (by C-G formula based on SCr of 0.61 mg/dL).  Liver Function Tests: Recent Labs  Lab 11/29/22 1545  AST 43*  ALT 35  ALKPHOS 82  BILITOT 1.1  PROT 5.9*  ALBUMIN 2.7*     Coagulation Profile: Recent Labs  Lab 11/29/22 1545  INR 1.2    CBG: Recent Labs  Lab 11/28/22 0806 11/28/22 1211 11/29/22 2332 11/30/22 0558 11/30/22 0743  GLUCAP 160* 147* 111* 116* 116*     Recent Results (from the past 240 hour(s))  Surgical pcr screen     Status: None   Collection Time: 11/23/22  9:02 PM   Specimen: Nasal Mucosa; Nasal Swab  Result Value Ref Range Status   MRSA, PCR NEGATIVE NEGATIVE Final   Staphylococcus aureus NEGATIVE NEGATIVE Final    Comment: (NOTE) The Xpert SA Assay (FDA approved for NASAL specimens in patients 60 years of age and older), is one component of a comprehensive surveillance program. It is not intended to diagnose infection nor to guide or monitor treatment. Performed at [facility], [address], [place], [place] [address]   Urine Culture     Status: None (Preliminary result)   Collection Time: 11/29/22  3:45 PM   Specimen: Urine, Catheterized  Result Value Ref Range Status   Specimen  Description URINE, CATHETERIZED  Final   Special Requests   Final    NONE Reflexed from [id number] Performed at [facility], [address] [place], [place] [address]    Culture PENDING  Incomplete   Report Status PENDING  Incomplete  SARS Coronavirus 2 by RT PCR (hospital order, performed in [facility] hospital lab) *cepheid single result test* Anterior Nasal Swab     Status: None   Collection Time: 11/29/22  5:54 PM   Specimen: Anterior Nasal Swab  Result Value Ref Range Status   SARS Coronavirus 2 by RT PCR NEGATIVE NEGATIVE Final    Comment: Performed at [facility], [address],  [place], [place] [address]  Blood Culture (routine x 2)     Status: None (Preliminary result)   Collection Time: 11/29/22  6:27 PM   Specimen: BLOOD  Result Value Ref Range Status   Specimen Description BLOOD SITE NOT SPECIFIED  Final   Special Requests   Final    BOTTLES DRAWN AEROBIC AND ANAEROBIC Blood Culture results may not be optimal due to an inadequate volume of blood received in culture bottles   Culture   Final    NO GROWTH < 24 HOURS Performed at [facility], [address], [place], [place] [address]    Report Status PENDING  Incomplete  Blood Culture (routine x 2)     Status: None (Preliminary result)   Collection Time: 11/29/22  6:30 PM   Specimen: BLOOD  Result Value Ref Range Status   Specimen Description BLOOD SITE NOT SPECIFIED  Final   Special Requests   Final    BOTTLES DRAWN AEROBIC AND ANAEROBIC Blood Culture results may not be optimal due to an inadequate volume of blood received in culture bottles   Culture   Final    NO GROWTH < 24 HOURS Performed at [facility], [address] [place], [place] [address]    Report Status PENDING  Incomplete      Radiology Studies: CT Head Wo Contrast  Result Date: 11/29/2022 CLINICAL DATA:  Mental status change EXAM: CT HEAD WITHOUT CONTRAST TECHNIQUE: Contiguous axial images were obtained from the base of  the skull through the vertex without intravenous contrast. RADIATION DOSE REDUCTION: This exam was performed according to the departmental dose-optimization program which includes automated exposure control, adjustment of the mA and/or kV according to patient size and/or use of iterative reconstruction technique. COMPARISON:  CT brain 11/22/2022 FINDINGS: Brain: No acute territorial infarction, hemorrhage or intracranial mass. Moderate white matter hypodensity consistent with chronic small vessel ischemic change. Nonenlarged ventricles. Vascular: No hyperdense vessels.  Carotid vascular calcification Skull: Normal. Negative for fracture or focal lesion. Sinuses/Orbits: No acute finding. Other: None IMPRESSION: 1. No CT evidence for acute intracranial abnormality. 2. Moderate chronic small vessel ischemic changes of the white matter. Electronically Signed   By: Jasmine Pang M.D.   On: 11/29/2022 17:56   DG Chest Port 1 View  Result Date: 11/29/2022 CLINICAL DATA:  Questionable sepsis - evaluate for abnormality EXAM: PORTABLE CHEST 1 VIEW COMPARISON:  11/22/2022. FINDINGS: Redemonstration of elevated right hemidiaphragm. Bilateral lungs exhibit coarse bronchovascular markings, which are nonspecific and differential diagnosis includes interstitial lung disease, COPD, chronic fibrosis, etc. No dense consolidation or lung mass. Previously seen bilobed nodule in the right lung lower lobe is grossly unchanged. There is apparent blunting of right lateral costophrenic angle, which may be due to superimposed soft tissues versus trace pleural effusion. Left lateral costophrenic angle is clear. Stable cardio-mediastinal silhouette. No acute osseous abnormalities. The soft tissues are within normal limits. There are metallic staples along the right lower lateral chest wall. IMPRESSION: 1. Coarse bronchovascular markings, which are nonspecific and differential diagnosis includes interstitial lung disease, COPD, chronic  fibrosis, etc. 2. Possible trace right pleural effusion. Electronically Signed   By: Jules Schick M.D.   On: 11/29/2022 16:00       LOS: 1 day   Vika Buske [employer] on [email or web address]  11/30/2022, 9:55 AM

## 2022-12-01 DIAGNOSIS — G9341 Metabolic encephalopathy: Secondary | ICD-10-CM | POA: Diagnosis not present

## 2022-12-01 DIAGNOSIS — N3000 Acute cystitis without hematuria: Secondary | ICD-10-CM | POA: Diagnosis not present

## 2022-12-01 LAB — GLUCOSE, CAPILLARY
Glucose-Capillary: 124 mg/dL — ABNORMAL HIGH (ref 70–99)
Glucose-Capillary: 117 mg/dL — ABNORMAL HIGH (ref 70–99)
Glucose-Capillary: 128 mg/dL — ABNORMAL HIGH (ref 70–99)
Glucose-Capillary: 129 mg/dL — ABNORMAL HIGH (ref 70–99)

## 2022-12-01 MED ORDER — AMOXICILLIN-POT CLAVULANATE 875-125 MG PO TABS
1.0000 | ORAL_TABLET | Freq: Two times a day (BID) | ORAL | Status: DC
  Administered 2022-12-01 – 2022-12-02 (×3): 1 via ORAL
  Filled 2022-12-01 (×3): qty 1

## 2022-12-01 NOTE — Progress Notes (Signed)
Speech Language Pathology Treatment: Dysphagia  Patient Details Name: Brenda Pham MRN: [id number] DOB: June 12, 1940 Today's Date: 12/01/2022 Time: 1555-1610 SLP Time Calculation (min) (ACUTE ONLY): 15 min  Assessment / Plan / Recommendation Clinical Impression  Patient seen by SLP for skilled treatment focused on dysphagia goals. Daughter reported that patient hasn't eaten much but other than coughing that seems more related to her allergies, she seems to be tolerating PO's well. Patient drank a sip of water via straw while SLP in the room and no immediate or delayed coughing observed. SLP and daughter then discussed advancing her to regular texture solids and letting daughter order foods from the menu that are not too difficult for her to chew and swallow to give patient more options and encourage increased PO intake. SLP advanced diet and will s/o at this time.    HPI HPI: Patient is an 82 y.o. female with PMH: severe dementia, DM-2, peripheral neuropathy, HLD, breast cancer, osteoporosis, recent femur fracture. Patient had been at [facility] rehab starting in 09/21/22 and per daughter, she had just transitioned to memory care three days before current admission. Staff members at memory care faciilty were concerned as she was more lethargic and not eating or drinking. CT head negative, CXR without infiltrates.      SLP Plan  All goals met;Discharge SLP treatment due to (comment)      Recommendations for follow up therapy are one component of a multi-disciplinary discharge planning process, led by the attending physician.  Recommendations may be updated based on patient status, additional functional criteria and insurance authorization.    Recommendations  Diet recommendations: Regular;Thin liquid Liquids provided via: Cup;Straw Medication Administration: Whole meds with puree Supervision: Patient able to self feed;Full supervision/cueing for compensatory strategies Compensations:  Slow rate;Small sips/bites                  Oral care BID   Frequent or constant Supervision/Assistance Dysphagia, unspecified (R13.10)     All goals met;Discharge SLP treatment due to (comment)     Angela Nevin, MA, CCC-SLP Speech Therapy

## 2022-12-01 NOTE — Progress Notes (Signed)
[facility] PROGRESS NOTE   GERIE SCHEUER GMW:[id number] DOB: Oct 16, 1940 DOA: 11/29/2022  PCP: Kaleen Mask, MD  Brief History/Interval Summary: 82 y.o. female with medical history significant of dementia, Type 2 diabetes with peripheral neuropathy, HLD, hx breast cancer, osteoporosis, recent right femur fx s/p repair presented with confusion. Patient has severe dementia and usually only oriented to self and family.  She was at a skilled nursing facility after recent hospitalization for femur fracture.  Daughter found her to be more somnolent.  She was subsequently brought into the emergency department.  Concern was raised for UTI.  She was hospitalized.  Concern was also raised for possible dysphagia.    Consultants: None  Procedures: None    Subjective/Interval History: Patient more communicative today compared to yesterday.  Remains confused.  Does not appear to be in any discomfort.    Assessment/Plan:  Acute metabolic encephalopathy Likely secondary to UTI in the setting of underlying dementia.  Continue to treat infection.  Reorient daily.  PT and OT evaluation. Mentation appears to be improving.  Urinary tract infection Urine culture growing Enterococcus faecalis and E. coli.  Waiting on final report.  Currently patient is on ceftriaxone.  WBC was normal.  She is afebrile.  Recheck labs tomorrow.  History of dementia Continue to monitor.  Stable for the most part.  Recent right femur fracture Status post fixation on 7/26.  PT and OT evaluation.  Concern for dysphagia Seen by speech therapy.  Currently on dysphagia 3 diet.  Diabetes mellitus type 2 HbA1c was 6.1 in May.  Continue SSI.   Normocytic anemia Hemoglobin is stable.  No evidence of overt bleeding.   Stage II decubitus involving coccyx. POA Pressure Injury 11/29/22 Coccyx Mid Stage 2 -  Partial thickness loss of dermis presenting as a shallow open injury with a red, pink wound bed  without slough. (Active)  11/29/22 2215  Location: Coccyx  Location Orientation: Mid  Staging: Stage 2 -  Partial thickness loss of dermis presenting as a shallow open injury with a red, pink wound bed without slough.  Wound Description (Comments):   Present on Admission: Yes    DVT Prophylaxis: Lovenox Code Status: DNR Family Communication: No family at bedside Disposition Plan:   Return to SNF when improved  Status is: Inpatient Remains inpatient appropriate because:  acute mental altered mental status, UTI      Medications: Scheduled:  enoxaparin (LOVENOX) injection  40 mg Subcutaneous Q24H   insulin aspart  0-9 Units Subcutaneous TID WC   thiamine  100 mg Oral Daily   Continuous:  sodium chloride 50 mL/hr at 12/01/22 1000   cefTRIAXone (ROCEPHIN)  IV 1 g (11/30/22 1722)   YQI:[id number], morphine injection, oxyCODONE  Antibiotics: Anti-infectives (From admission, onward)    Start     Dose/Rate Route Frequency Ordered Stop   11/30/22 1800  cefTRIAXone (ROCEPHIN) 1 g in sodium chloride 0.9 % 100 mL IVPB        1 g 200 mL/hr over 30 Minutes Intravenous Every 24 hours 11/29/22 2015     11/29/22 1700  cefTRIAXone (ROCEPHIN) 1 g in sodium chloride 0.9 % 100 mL IVPB        1 g 200 mL/hr over 30 Minutes Intravenous  Once 11/29/22 1645 11/29/22 1903       Objective:  Vital Signs  Vitals:   11/30/22 1650 11/30/22 1941 12/01/22 0413 12/01/22 0745  BP: 130/69 (!) 150/70 (!) 154/81 129/61  Pulse: 93 96 (!)  114 96  Resp: 17 18 20 17   Temp: 98.7 F (37.1 C) 98.3 F (36.8 C) 98.7 F (37.1 C) 98.2 F (36.8 C)  TempSrc: Oral Oral Oral Oral  SpO2: 100% 95% 94% 97%    Intake/Output Summary (Last 24 hours) at 12/01/2022 1013 Last data filed at 12/01/2022 1000 Gross per 24 hour  Intake 1160.23 ml  Output 1300 ml  Net -139.77 ml   There were no vitals filed for this visit.  General appearance: Awake alert.  In no distress.  Pleasantly confused Resp: Clear to  auscultation bilaterally.  Normal effort Cardio: S1-S2 is normal regular.  No S3-S4.  No rubs murmurs or bruit GI: Abdomen is soft.  Nontender nondistended.  Bowel sounds are present normal.  No masses organomegaly Extremities: No edema.  No swelling over the right thigh.  Physical deconditioning is noted. No obvious focal neurological deficits.   Lab Results:  Data Reviewed: I have personally reviewed following labs and reports of the imaging studies  CBC: Recent Labs  Lab 11/26/22 0952 11/27/22 0339 11/28/22 0720 11/29/22 1545 11/29/22 2320  WBC 9.9 6.9 6.5 6.9 6.8  NEUTROABS  --   --   --  4.8  --   HGB 9.3* 9.4* 10.4* 9.8* 9.6*  HCT 30.0* 30.2* 32.7* 32.0* 30.4*  MCV 85.0 84.4 84.1 83.1 82.8  PLT 186 163 211 247 224    Basic Metabolic Panel: Recent Labs  Lab 11/26/22 0952 11/27/22 0339 11/28/22 0720 11/29/22 1545 11/29/22 2320  NA 137 135 137 135 134*  K 3.8 3.8 4.0 4.1 3.9  CL 105 105 105 104 104  CO2 23 23 24 22  20*  GLUCOSE 145* 112* 147* 127* 125*  BUN 17 17 16 13 11   CREATININE 0.60 0.65 0.60 0.63 0.61  CALCIUM 8.0* 7.9* 8.5* 8.5* 8.4*    GFR: Estimated Creatinine Clearance: 48.8 mL/min (by C-G formula based on SCr of 0.61 mg/dL).  Liver Function Tests: Recent Labs  Lab 11/29/22 1545  AST 43*  ALT 35  ALKPHOS 82  BILITOT 1.1  PROT 5.9*  ALBUMIN 2.7*     Coagulation Profile: Recent Labs  Lab 11/29/22 1545  INR 1.2    CBG: Recent Labs  Lab 11/30/22 0743 11/30/22 1221 11/30/22 1651 11/30/22 1941 12/01/22 0746  GLUCAP 116* 130* 122* 128* 124*     Recent Results (from the past 240 hour(s))  Surgical pcr screen     Status: None   Collection Time: 11/23/22  9:02 PM   Specimen: Nasal Mucosa; Nasal Swab  Result Value Ref Range Status   MRSA, PCR NEGATIVE NEGATIVE Final   Staphylococcus aureus NEGATIVE NEGATIVE Final    Comment: (NOTE) The Xpert SA Assay (FDA approved for NASAL specimens in patients 62 years of age and older), is  one component of a comprehensive surveillance program. It is not intended to diagnose infection nor to guide or monitor treatment. Performed at [facility], [address] [place], [place] [address]   Urine Culture     Status: Abnormal (Preliminary result)   Collection Time: 11/29/22  3:45 PM   Specimen: Urine, Catheterized  Result Value Ref Range Status   Specimen Description URINE, CATHETERIZED  Final   Special Requests NONE Reflexed from [phone]  Final   Culture (A)  Final    >=100,000 COLONIES/mL ENTEROCOCCUS FAECALIS 40,000 COLONIES/mL ESCHERICHIA COLI SUSCEPTIBILITIES TO FOLLOW Performed at [facility], [address], [place], [place] [address]    Report Status  PENDING  Incomplete  SARS Coronavirus 2 by RT PCR (hospital order, performed in [facility] hospital lab) *cepheid single result test* Anterior Nasal Swab     Status: None   Collection Time: 11/29/22  5:54 PM   Specimen: Anterior Nasal Swab  Result Value Ref Range Status   SARS Coronavirus 2 by RT PCR NEGATIVE NEGATIVE Final    Comment: Performed at [facility], [address], [place], [place] [address]  Blood Culture (routine x 2)     Status: None (Preliminary result)   Collection Time: 11/29/22  6:27 PM   Specimen: BLOOD  Result Value Ref Range Status   Specimen Description BLOOD SITE NOT SPECIFIED  Final   Special Requests   Final    BOTTLES DRAWN AEROBIC AND ANAEROBIC Blood Culture results may not be optimal due to an inadequate volume of blood received in culture bottles   Culture   Final    NO GROWTH 2 DAYS Performed at [facility], [address], [place], [place] [address]    Report Status PENDING  Incomplete  Blood Culture (routine x 2)     Status: None (Preliminary result)   Collection Time: 11/29/22  6:30 PM   Specimen: BLOOD  Result Value Ref Range Status   Specimen Description BLOOD SITE NOT SPECIFIED  Final   Special Requests   Final    BOTTLES DRAWN  AEROBIC AND ANAEROBIC Blood Culture results may not be optimal due to an inadequate volume of blood received in culture bottles   Culture   Final    NO GROWTH 2 DAYS Performed at [facility], [address], [place], [place] [address]    Report Status PENDING  Incomplete      Radiology Studies: CT Head Wo Contrast  Result Date: 11/29/2022 CLINICAL DATA:  Mental status change EXAM: CT HEAD WITHOUT CONTRAST TECHNIQUE: Contiguous axial images were obtained from the base of the skull through the vertex without intravenous contrast. RADIATION DOSE REDUCTION: This exam was performed according to the departmental dose-optimization program which includes automated exposure control, adjustment of the mA and/or kV according to patient size and/or use of iterative reconstruction technique. COMPARISON:  CT brain 11/22/2022 FINDINGS: Brain: No acute territorial infarction, hemorrhage or intracranial mass. Moderate white matter hypodensity consistent with chronic small vessel ischemic change. Nonenlarged ventricles. Vascular: No hyperdense vessels.  Carotid vascular calcification Skull: Normal. Negative for fracture or focal lesion. Sinuses/Orbits: No acute finding. Other: None IMPRESSION: 1. No CT evidence for acute intracranial abnormality. 2. Moderate chronic small vessel ischemic changes of the white matter. Electronically Signed   By: Jasmine Pang M.D.   On: 11/29/2022 17:56   DG Chest Port 1 View  Result Date: 11/29/2022 CLINICAL DATA:  Questionable sepsis - evaluate for abnormality EXAM: PORTABLE CHEST 1 VIEW COMPARISON:  11/22/2022. FINDINGS: Redemonstration of elevated right hemidiaphragm. Bilateral lungs exhibit coarse bronchovascular markings, which are nonspecific and differential diagnosis includes interstitial lung disease, COPD, chronic fibrosis, etc. No dense consolidation or lung mass. Previously seen bilobed nodule in the right lung lower lobe is grossly unchanged. There is apparent  blunting of right lateral costophrenic angle, which may be due to superimposed soft tissues versus trace pleural effusion. Left lateral costophrenic angle is clear. Stable cardio-mediastinal silhouette. No acute osseous abnormalities. The soft tissues are within normal limits. There are metallic staples along the right lower lateral chest wall. IMPRESSION: 1. Coarse bronchovascular markings, which are nonspecific and differential diagnosis includes interstitial lung  disease, COPD, chronic fibrosis, etc. 2. Possible trace right pleural effusion. Electronically Signed   By: Jules Schick M.D.   On: 11/29/2022 16:00       LOS: 2 days   Almus Woodham Rito Ehrlich  [facility] Pager on [email or web address]  12/01/2022, 10:13 AM

## 2022-12-01 NOTE — Evaluation (Signed)
Physical Therapy Evaluation Patient Details Name: Brenda Pham MRN: [id number] DOB: 04/07/1941 Today's Date: 12/01/2022  History of Present Illness  The pt is an 82 yo female presenting 7/30 from SNF with AMS. Pt had recently been admitted 7/23-7/29 for R hip fx sustained in a fall and had d/c to SNF on 7/29. PMH includes: dementia, DM II, peripheral neuropathy, HLD, hx breast cancer, and osteoporosis.   Clinical Impression  Pt in bed upon arrival of PT, agreeable to evaluation at this time. Prior to original admission, pt ambulating with use of walker, but has needed assist from staff for all mobility and ADLs since hip fx. The pt now presents with limitations in functional mobility, ROM, strength, stability, awareness, problem solving, and activity tolerance due to above dx and resulting pain, and will continue to benefit from skilled PT to address these deficits. The pt required totalA to complete bed mobility due to limited command following and assist needed for all components of mobility, but was then able to complete multiple sit-stand transfers and stand-pivot transfer wit hmod-maxA of 2 and use of RW. Pt with poor wt shift on RLE and max A and max cues needed for posture, positioning, and technique throughout. Recommend return to facility for inpatient rehab <3 hours/day once medically stable.          If plan is discharge home, recommend the following: Two people to help with walking and/or transfers;A lot of help with bathing/dressing/bathroom;Assistance with cooking/housework;Assist for transportation;Help with stairs or ramp for entrance   Can travel by private vehicle   No    Equipment Recommendations Other (comment) (defer to post acute)  Recommendations for Other Services       Functional Status Assessment Patient has had a recent decline in their functional status and demonstrates the ability to make significant improvements in function in a reasonable and predictable  amount of time.     Precautions / Restrictions Precautions Precautions: Fall Precaution Comments: last admission for fall, confused Restrictions Weight Bearing Restrictions: Yes RLE Weight Bearing: Weight bearing as tolerated      Mobility  Bed Mobility Overal bed mobility: Needs Assistance Bed Mobility: Supine to Sit, Sit to Supine     Supine to sit: Total assist, +2 for physical assistance Sit to supine: Total assist, +2 for physical assistance   General bed mobility comments: helicopter transfer to sitting EOB as pt unable to assist with movement of LE and minimal assist with trunk. assist to LE and trunk to return to supine.    Transfers Overall transfer level: Needs assistance Equipment used: Rolling walker (2 wheels) Transfers: Sit to/from Stand, Bed to chair/wheelchair/BSC Sit to Stand: Mod assist, Max assist, +2 physical assistance   Step pivot transfers: Max assist, +2 physical assistance       General transfer comment: modA to rise initially, progressed to maxA with RW. max cues for posture, wt shift, and head upright.    Ambulation/Gait               General Gait Details: limited to pivotal steps which required maxA to steady, assist to manage RW, and assist to facilitate wt shift    Balance Overall balance assessment: Needs assistance, History of Falls Sitting-balance support: Bilateral upper extremity supported, Feet supported Sitting balance-Leahy Scale: Poor   Postural control: Posterior lean Standing balance support: Bilateral upper extremity supported, During functional activity Standing balance-Leahy Scale: Poor Standing balance comment: moderate assistance for static standing  Pertinent Vitals/Pain Pain Assessment Pain Assessment: Faces Faces Pain Scale: Hurts whole lot Pain Location: L knee with flexion, R hip with stance Pain Descriptors / Indicators: Grimacing, Aching Pain Intervention(s):  Monitored during session, Limited activity within patient's tolerance, Repositioned    Home Living Family/patient expects to be discharged to:: Skilled nursing facility                   Additional Comments: return to SNF rehab then to [facility] memory care    Prior Function Prior Level of Function : Needs assist             Mobility Comments: pt using RW, needed assist from staff at hospital and SNF       Hand Dominance   Dominant Hand: Right    Extremity/Trunk Assessment   Upper Extremity Assessment Upper Extremity Assessment: Defer to OT evaluation    Lower Extremity Assessment Lower Extremity Assessment: Generalized weakness (RLE limited by pain in hip, pt able to move against gravity. Pt with limited knee flexion LLE and reports pain with knee flexion)    Cervical / Trunk Assessment Cervical / Trunk Assessment: Kyphotic  Communication   Communication: HOH  Cognition Arousal/Alertness: Awake/alert Behavior During Therapy: Flat affect, Agitated (agitated with pain) Overall Cognitive Status: History of cognitive impairments - at baseline                                 General Comments: pt daughter present and states she seems closer to her baseline. pt agreeable to session but with poor STM and awareness, needs frequent redirection and cues               Assessment/Plan    PT Assessment Patient needs continued PT services  PT Problem List Decreased strength;Decreased range of motion;Decreased activity tolerance;Decreased balance;Decreased mobility;Decreased knowledge of use of DME;Pain       PT Treatment Interventions DME instruction;Therapeutic exercise;Gait training;Functional mobility training;Therapeutic activities;Patient/family education;Balance training    PT Goals (Current goals can be found in the Care Plan section)  Acute Rehab PT Goals Patient Stated Goal: to return to [facility] PT Goal Formulation: Patient unable to  participate in goal setting Time For Goal Achievement: 12/15/22 Potential to Achieve Goals: Good    Frequency Min 1X/week     Co-evaluation PT/OT/SLP Co-Evaluation/Treatment: Yes Reason for Co-Treatment: For patient/therapist safety;To address functional/ADL transfers PT goals addressed during session: Mobility/safety with mobility;Balance;Proper use of DME;Strengthening/ROM         AM-PAC PT "6 Clicks" Mobility  Outcome Measure Help needed turning from your back to your side while in a flat bed without using bedrails?: A Lot Help needed moving from lying on your back to sitting on the side of a flat bed without using bedrails?: Total Help needed moving to and from a bed to a chair (including a wheelchair)?: Total Help needed standing up from a chair using your arms (e.g., wheelchair or bedside chair)?: Total Help needed to walk in hospital room?: Total Help needed climbing 3-5 steps with a railing? : Total 6 Click Score: 7    End of Session Equipment Utilized During Treatment: Gait belt Activity Tolerance: Patient limited by pain Patient left: in bed;with call bell/phone within reach;with bed alarm set Nurse Communication: Need for lift equipment;Mobility status (stedy to chair) PT Visit Diagnosis: History of falling (Z91.81);Difficulty in walking, not elsewhere classified (R26.2);Pain;Muscle weakness (generalized) (M62.81) Pain - Right/Left: Right Pain -  part of body: Leg    Time: 1441-1519 PT Time Calculation (min) (ACUTE ONLY): 38 min   Charges:   PT Evaluation $PT Eval Moderate Complexity: 1 Mod PT Treatments $Therapeutic Activity: 8-22 mins PT General Charges $$ ACUTE PT VISIT: 1 Visit         Vickki Muff, PT, DPT   Acute Rehabilitation Department Office [phone] Secure Chat Communication Preferred  Brenda Pham 12/01/2022, 4:26 PM

## 2022-12-01 NOTE — Evaluation (Signed)
Occupational Therapy Evaluation Patient Details Name: Brenda Pham MRN: [id number] DOB: 22-Feb-1941 Today's Date: 12/01/2022   History of Present Illness The pt is an 82 yo female presenting 7/30 from SNF with AMS. Pt had recently been admitted 7/23-7/29 for R hip fx sustained in a fall and had d/c to SNF on 7/29. PMH includes: dementia, DM II, peripheral neuropathy, HLD, hx breast cancer, and osteoporosis.   Clinical Impression   Pt s/p above diagnosis. Pt still recovering from prior admission with R hip fx, staying at SNF receiving therapy, has not declined since last admission. Pt presents with significant confusion, some agitation due to pain in R hip, A/Ox1. Pt requires significant assistance for all ADLs, max verbal cues for attention and orientation to task, and physical assistance for all UB/LB ADLs, transfers, mobility. Return to SNF recommended to continue therapy, will be seen acutely to maximize progress as able.      Recommendations for follow up therapy are one component of a multi-disciplinary discharge planning process, led by the attending physician.  Recommendations may be updated based on patient status, additional functional criteria and insurance authorization.   Assistance Recommended at Discharge Frequent or constant Supervision/Assistance  Patient can return home with the following Two people to help with walking and/or transfers;A lot of help with bathing/dressing/bathroom;Assistance with cooking/housework;Direct supervision/assist for medications management;Direct supervision/assist for financial management;Assist for transportation;Help with stairs or ramp for entrance    Functional Status Assessment     Equipment Recommendations  Other (comment) (defer)    Recommendations for Other Services       Precautions / Restrictions Precautions Precautions: Fall Precaution Comments: last admission for fall, confused Restrictions Weight Bearing Restrictions:  Yes RLE Weight Bearing: Weight bearing as tolerated      Mobility Bed Mobility Overal bed mobility: Needs Assistance Bed Mobility: Supine to Sit, Sit to Supine     Supine to sit: Total assist, +2 for physical assistance Sit to supine: Total assist, +2 for physical assistance   General bed mobility comments: assistance for sitting up and scooting BLEs on/off bed. total A for scooting back in bed    Transfers Overall transfer level: Needs assistance Equipment used: Rolling walker (2 wheels) Transfers: Sit to/from Stand, Bed to chair/wheelchair/BSC Sit to Stand: Mod assist, Max assist, +2 physical assistance     Step pivot transfers: Max assist, +2 physical assistance     General transfer comment: mod-max A for STS, significant cueing and increased time for pivot transfer      Balance Overall balance assessment: Needs assistance, History of Falls Sitting-balance support: Bilateral upper extremity supported, Feet supported Sitting balance-Leahy Scale: Poor Sitting balance - Comments: sitting EOB or on BSC Postural control: Posterior lean Standing balance support: Bilateral upper extremity supported, During functional activity Standing balance-Leahy Scale: Poor Standing balance comment: reliant on RW and x2 support                           ADL either performed or assessed with clinical judgement   ADL Overall ADL's : Needs assistance/impaired;At baseline Eating/Feeding: Set up;Sitting;Bed level   Grooming: Minimal assistance;Sitting   Upper Body Bathing: Moderate assistance;Sitting   Lower Body Bathing: Maximal assistance;Sitting/lateral leans;+2 for physical assistance   Upper Body Dressing : Minimal assistance;Sitting   Lower Body Dressing: Total assistance;+2 for physical assistance;Sit to/from stand   Toilet Transfer: Maximal assistance;+2 for physical assistance   Toileting- Clothing Manipulation and Hygiene: Total assistance;+2 for physical  assistance;Sit  to/from stand       Functional mobility during ADLs: Maximal assistance;+2 for physical assistance;Rolling walker (2 wheels) General ADL Comments: Pt requires max cueing for initiation and participation in UB/grooming tasks, unable to perform LB ADLs,     Vision Baseline Vision/History: 1 Wears glasses Ability to See in Adequate Light: 0 Adequate Patient Visual Report: No change from baseline       Perception     Praxis      Pertinent Vitals/Pain Pain Assessment Faces Pain Scale: Hurts whole lot Pain Location: L knee with flexion, R hip with stance Pain Descriptors / Indicators: Grimacing, Aching Pain Intervention(s): Monitored during session     Hand Dominance Right   Extremity/Trunk Assessment Upper Extremity Assessment Upper Extremity Assessment: Generalized weakness   Lower Extremity Assessment Lower Extremity Assessment: Generalized weakness (RLE limited by pain in hip, pt able to move against gravity. Pt with limited knee flexion LLE and reports pain with knee flexion)   Cervical / Trunk Assessment Cervical / Trunk Assessment: Kyphotic   Communication Communication Communication: HOH   Cognition Arousal/Alertness: Awake/alert Behavior During Therapy: Flat affect, Agitated (agitated with pain) Overall Cognitive Status: History of cognitive impairments - at baseline                                 General Comments: frequent cueing, decreased attention to task, some agitation when in pain during transfers or bed mobility, A/Ox1     General Comments       Exercises     Shoulder Instructions      Home Living Family/patient expects to be discharged to:: Skilled nursing facility                                 Additional Comments: return to SNF rehab then to [facility] memory care      Prior Functioning/Environment Prior Level of Function : Needs assist             Mobility Comments: pt using RW, needed  assist from staff at hospital and SNF ADLs Comments: SNF staff likely assists at baseline        OT Problem List: Decreased strength;Decreased activity tolerance;Impaired balance (sitting and/or standing);Decreased cognition;Decreased knowledge of use of DME or AE;Pain      OT Treatment/Interventions: Self-care/ADL training;Therapeutic activities;Patient/family education;Balance training    OT Goals(Current goals can be found in the care plan section) Acute Rehab OT Goals Patient Stated Goal: unable to participate in goal setting OT Goal Formulation: Patient unable to participate in goal setting Time For Goal Achievement: 12/15/22 Potential to Achieve Goals: Fair  OT Frequency: Min 1X/week    Co-evaluation   Reason for Co-Treatment: For patient/therapist safety;To address functional/ADL transfers PT goals addressed during session: Mobility/safety with mobility;Balance;Proper use of DME;Strengthening/ROM OT goals addressed during session: ADL's and self-care;Proper use of Adaptive equipment and DME      AM-PAC OT "6 Clicks" Daily Activity     Outcome Measure Help from another person eating meals?: A Little Help from another person taking care of personal grooming?: A Lot Help from another person toileting, which includes using toliet, bedpan, or urinal?: Total Help from another person bathing (including washing, rinsing, drying)?: A Lot Help from another person to put on and taking off regular upper body clothing?: A Lot Help from another person to put on and taking off regular  lower body clothing?: A Lot 6 Click Score: 12   End of Session Equipment Utilized During Treatment: Gait belt;Rolling walker (2 wheels) Nurse Communication: Need for lift equipment (stedy)  Activity Tolerance: Patient tolerated treatment well Patient left: in bed;with call bell/phone within reach;with bed alarm set;with family/visitor present  OT Visit Diagnosis: Unsteadiness on feet (R26.81);Other  abnormalities of gait and mobility (R26.89);Muscle weakness (generalized) (M62.81);Pain;Other symptoms and signs involving cognitive function Pain - Right/Left: Right                Time: 6295-2841 OT Time Calculation (min): 38 min Charges:  OT General Charges $OT Visit: 1 Visit OT Evaluation $OT Eval Moderate Complexity: 1 [address] OTR/L   Alexis Goodell 12/01/2022, 5:24 PM

## 2022-12-01 NOTE — Plan of Care (Signed)
  Problem: Activity: Goal: Ability to ambulate and perform ADLs will improve Outcome: Not Progressing Patient needs PT/OT   Problem: Pain Management: Goal: Pain level will decrease Outcome: Progressing

## 2022-12-02 DIAGNOSIS — G9341 Metabolic encephalopathy: Secondary | ICD-10-CM | POA: Diagnosis not present

## 2022-12-02 LAB — BASIC METABOLIC PANEL
Anion gap: 15 (ref 5–15)
BUN: 5 mg/dL — ABNORMAL LOW (ref 8–23)
CO2: 21 mmol/L — ABNORMAL LOW (ref 22–32)
Calcium: 9.3 mg/dL (ref 8.9–10.3)
Chloride: 102 mmol/L (ref 98–111)
Creatinine, Ser: 0.55 mg/dL (ref 0.44–1.00)
GFR, Estimated: >60 mL/min (ref >60)
Glucose, Bld: 129 mg/dL — ABNORMAL HIGH (ref 70–99)
Potassium: 3.7 mmol/L (ref 3.5–5.1)
Sodium: 138 mmol/L (ref 135–145)

## 2022-12-02 LAB — CBC
HCT: 32.4 % — ABNORMAL LOW (ref 36.0–46.0)
Hemoglobin: 10.6 g/dL — ABNORMAL LOW (ref 12.0–15.0)
MCH: 27 pg (ref 26.0–34.0)
MCHC: 32.7 g/dL (ref 30.0–36.0)
MCV: 82.7 fL (ref 80.0–100.0)
Platelets: 257 10*3/uL (ref 150–400)
RBC: 3.92 MIL/uL (ref 3.87–5.11)
RDW: 15.7 % — ABNORMAL HIGH (ref 11.5–15.5)
WBC: 5 10*3/uL (ref 4.0–10.5)
nRBC: 0 % (ref 0.0–0.2)

## 2022-12-02 LAB — GLUCOSE, CAPILLARY
Glucose-Capillary: 123 mg/dL — ABNORMAL HIGH (ref 70–99)
Glucose-Capillary: 86 mg/dL (ref 70–99)

## 2022-12-02 LAB — MAGNESIUM: Magnesium: 1.8 mg/dL (ref 1.7–2.4)

## 2022-12-02 MED ORDER — HYDROCODONE-ACETAMINOPHEN 5-325 MG PO TABS: 1.0000 | ORAL_TABLET | ORAL | 0 refills | Status: AC | PRN

## 2022-12-02 MED ORDER — LORAZEPAM 0.5 MG PO TABS: 0.5000 mg | ORAL_TABLET | Freq: Three times a day (TID) | ORAL | 0 refills | Status: AC | PRN

## 2022-12-02 MED ORDER — AMOXICILLIN-POT CLAVULANATE 875-125 MG PO TABS: 1.0000 | ORAL_TABLET | Freq: Two times a day (BID) | ORAL | Status: AC

## 2022-12-02 NOTE — Progress Notes (Signed)
First 2 attempts to call report to receiving facility went to voicemail, voice message left to call back.

## 2022-12-02 NOTE — TOC Progression Note (Signed)
Transition of Care [facility]) - Progression Note    Patient Details  Name: Brenda Pham MRN: [id number] Date of Birth: [birth year]  [facility] CM/SW Contact  Lorri Frederick, LCSW Phone Number: 12/02/2022, 10:52 AM  Clinical Narrative:   CSW message with [facility], who confirms they can receive pt today.  Medicare payer, inpatient order 7/30.  CSW spoke with daughter Brenda Pham, who is aware plan for DC to [facility] today.     Expected Discharge Plan: Skilled Nursing Facility Barriers to Discharge: Continued Medical Work up  Expected Discharge Plan and Services         Expected Discharge Date: 12/02/22                                     Social Determinants of Health (SDOH) Interventions SDOH Screenings   Food Insecurity: No Food Insecurity (11/23/2022)  Housing: Low Risk  (11/23/2022)  Transportation Needs: No Transportation Needs (11/23/2022)  Utilities: Not At Risk (11/23/2022)  Tobacco Use: Medium Risk (11/29/2022)    Readmission Risk Interventions    11/23/2022    1:22 PM 09/20/2022    2:11 PM  Readmission Risk Prevention Plan  Post Dischage Appt Complete Complete  Medication Screening Complete Complete  Transportation Screening Complete Complete

## 2022-12-02 NOTE — TOC Transition Note (Signed)
Transition of Care [facility]) - CM/SW Discharge Note   Patient Details  Name: Brenda Pham MRN: [id number] Date of Birth: 1940-08-27  [facility]) CM/SW Contact:  Lorri Frederick, LCSW Phone Number: 12/02/2022, 10:54 AM   Clinical Narrative:   Pt discharging to [facility], room [address].  RN call report to [phone].     Final next level of care: Skilled Nursing Facility Barriers to Discharge: Barriers Resolved   Patient Goals and CMS Choice      Discharge Placement                Patient chooses bed at: [facility] Patient to be transferred to facility by: [facility] Name of family member notified: daughter Dawn Patient and family notified of of transfer: 12/02/22  Discharge Plan and Services Additional resources added to the After Visit Summary for                                       Social Determinants of Health (SDOH) Interventions SDOH Screenings   Food Insecurity: No Food Insecurity (11/23/2022)  Housing: Low Risk  (11/23/2022)  Transportation Needs: No Transportation Needs (11/23/2022)  Utilities: Not At Risk (11/23/2022)  Tobacco Use: Medium Risk (11/29/2022)     Readmission Risk Interventions    11/23/2022    1:22 PM 09/20/2022    2:11 PM  Readmission Risk Prevention Plan  Post Dischage Appt Complete Complete  Medication Screening Complete Complete  Transportation Screening Complete Complete

## 2022-12-02 NOTE — Discharge Summary (Signed)
[facility]  Physician Discharge Summary   Patient ID: Brenda Pham MRN: [id number] DOB/AGE: [birth year] 82 y.o.  Admit date: 11/29/2022 Discharge date:   12/02/2022   PCP: Kaleen Mask, MD  DISCHARGE DIAGNOSES:    Acute metabolic encephalopathy   Type 2 diabetes mellitus (HCC)   UTI (urinary tract infection)   History of fracture of femur   RECOMMENDATIONS FOR OUTPATIENT FOLLOW UP: Please check CBC and basic metabolic panel in 1 week    Home Health: Going to SNF Equipment/Devices: None  CODE STATUS: DNR  DISCHARGE CONDITION: fair  Diet recommendation: Regular as tolerated  INITIAL HISTORY: 82 y.o. female with medical history significant of dementia, Type 2 diabetes with peripheral neuropathy, HLD, hx breast cancer, osteoporosis, recent right femur fx s/p repair presented with confusion. Patient has severe dementia and usually only oriented to self and family.  She was at a skilled nursing facility after recent hospitalization for femur fracture.  Daughter found her to be more somnolent.  She was subsequently brought into the emergency department.  Concern was raised for UTI.  She was hospitalized.  Concern was also raised for possible dysphagia.   HOSPITAL COURSE:   Acute metabolic encephalopathy Likely secondary to UTI in the setting of underlying dementia.  Mentation has improved with treatment of UTI.  No focal neurological deficits were present.   Urinary tract infection Urine culture growing Enterococcus faecalis and E. coli.  Sensitivities reviewed.  Transitioned to Augmentin.    History of dementia Stable for the most part.   Recent right femur fracture Status post fixation on 7/26.  Continue aspirin twice a day for DVT prophylaxis as recommended by orthopedics during previous hospitalization.   Concern for dysphagia Seen by speech therapy.  On regular diet.   Diabetes mellitus type 2 HbA1c was 6.1 in May.  Continue SSI.     Normocytic anemia Hemoglobin is stable.  No evidence of overt bleeding.   Stage II decubitus involving coccyx. POA Pressure Injury 11/29/22 Coccyx Mid Stage 2 -  Partial thickness loss of dermis presenting as a shallow open injury with a red, pink wound bed without slough. (Active)  11/29/22 2215  Location: Coccyx  Location Orientation: Mid  Staging: Stage 2 -  Partial thickness loss of dermis presenting as a shallow open injury with a red, pink wound bed without slough.  Wound Description (Comments):   Present on Admission: Yes     Patient is stable.  Okay for discharge to SNF.  PERTINENT LABS:  The results of significant diagnostics from this hospitalization (including imaging, microbiology, ancillary and laboratory) are listed below for reference.    Microbiology: Recent Results (from the past 240 hour(s))  Surgical pcr screen     Status: None   Collection Time: 11/23/22  9:02 PM   Specimen: Nasal Mucosa; Nasal Swab  Result Value Ref Range Status   MRSA, PCR NEGATIVE NEGATIVE Final   Staphylococcus aureus NEGATIVE NEGATIVE Final    Comment: (NOTE) The Xpert SA Assay (FDA approved for NASAL specimens in patients 42 years of age and older), is one component of a comprehensive surveillance program. It is not intended to diagnose infection nor to guide or monitor treatment. Performed at [facility], [address] [place], [place] [address]   Urine Culture     Status: Abnormal   Collection Time: 11/29/22  3:45 PM   Specimen: Urine, Catheterized  Result Value Ref Range Status   Specimen Description URINE, CATHETERIZED  Final  Special Requests   Final    NONE Reflexed from [id number] Performed at [facility], [address], [place], [place] [address]    Culture (A)  Final    >=100,000 COLONIES/mL ENTEROCOCCUS FAECALIS 40,000 COLONIES/mL ESCHERICHIA COLI    Report Status 12/01/2022 FINAL  Final   Organism ID, Bacteria ENTEROCOCCUS FAECALIS (A)   Final   Organism ID, Bacteria ESCHERICHIA COLI (A)  Final      Susceptibility   Escherichia coli - MIC*    AMPICILLIN RESISTANT Resistant     CEFAZOLIN <=4 SENSITIVE Sensitive     CEFEPIME <=0.12 SENSITIVE Sensitive     CEFTRIAXONE 1 SENSITIVE Sensitive     CIPROFLOXACIN <=0.25 SENSITIVE Sensitive     GENTAMICIN 8 INTERMEDIATE Intermediate     IMIPENEM <=0.25 SENSITIVE Sensitive     NITROFURANTOIN <=16 SENSITIVE Sensitive     TRIMETH/SULFA <=20 SENSITIVE Sensitive     AMPICILLIN/SULBACTAM 4 SENSITIVE Sensitive     PIP/TAZO <=4 SENSITIVE Sensitive     * 40,000 COLONIES/mL ESCHERICHIA COLI   Enterococcus faecalis - MIC*    AMPICILLIN <=2 SENSITIVE Sensitive     NITROFURANTOIN <=16 SENSITIVE Sensitive     VANCOMYCIN 1 SENSITIVE Sensitive     * >=100,000 COLONIES/mL ENTEROCOCCUS FAECALIS  SARS Coronavirus 2 by RT PCR (hospital order, performed in [facility] hospital lab) *cepheid single result test* Anterior Nasal Swab     Status: None   Collection Time: 11/29/22  5:54 PM   Specimen: Anterior Nasal Swab  Result Value Ref Range Status   SARS Coronavirus 2 by RT PCR NEGATIVE NEGATIVE Final    Comment: Performed at [facility], [address], [place], [place] [address]  Blood Culture (routine x 2)     Status: None (Preliminary result)   Collection Time: 11/29/22  6:27 PM   Specimen: BLOOD  Result Value Ref Range Status   Specimen Description BLOOD SITE NOT SPECIFIED  Final   Special Requests   Final    BOTTLES DRAWN AEROBIC AND ANAEROBIC Blood Culture results may not be optimal due to an inadequate volume of blood received in culture bottles   Culture   Final    NO GROWTH 3 DAYS Performed at [facility], [address], [place], [place] [address]    Report Status PENDING  Incomplete  Blood Culture (routine x 2)     Status: None (Preliminary result)   Collection Time: 11/29/22  6:30 PM   Specimen: BLOOD  Result Value Ref Range Status   Specimen Description BLOOD  SITE NOT SPECIFIED  Final   Special Requests   Final    BOTTLES DRAWN AEROBIC AND ANAEROBIC Blood Culture results may not be optimal due to an inadequate volume of blood received in culture bottles   Culture   Final    NO GROWTH 3 DAYS Performed at [facility], [address] [place], [place] [address]    Report Status PENDING  Incomplete     Labs:   Basic Metabolic Panel: Recent Labs  Lab 11/27/22 0339 11/28/22 0720 11/29/22 1545 11/29/22 2320 12/02/22 0848  NA 135 137 135 134* 138  K 3.8 4.0 4.1 3.9 3.7  CL 105 105 104 104 102  CO2 23 24 22  20* 21*  GLUCOSE 112* 147* 127* 125* 129*  BUN 17 16 13 11  5*  CREATININE 0.65 0.60 0.63 0.61 0.55  CALCIUM 7.9* 8.5* 8.5* 8.4* 9.3  MG  --   --   --   --  1.8   Liver Function Tests: Recent Labs  Lab 11/29/22 1545  AST 43*  ALT 35  ALKPHOS 82  BILITOT 1.1  PROT 5.9*  ALBUMIN 2.7*    CBC: Recent Labs  Lab 11/27/22 0339 11/28/22 0720 11/29/22 1545 11/29/22 2320 12/02/22 0848  WBC 6.9 6.5 6.9 6.8 5.0  NEUTROABS  --   --  4.8  --   --   HGB 9.4* 10.4* 9.8* 9.6* 10.6*  HCT 30.2* 32.7* 32.0* 30.4* 32.4*  MCV 84.4 84.1 83.1 82.8 82.7  PLT 163 211 247 224 257     CBG: Recent Labs  Lab 12/01/22 0746 12/01/22 1219 12/01/22 1708 12/01/22 2108 12/02/22 0809  GLUCAP 124* 117* 128* 129* 123*     IMAGING STUDIES CT Head Wo Contrast  Result Date: 11/29/2022 CLINICAL DATA:  Mental status change EXAM: CT HEAD WITHOUT CONTRAST TECHNIQUE: Contiguous axial images were obtained from the base of the skull through the vertex without intravenous contrast. RADIATION DOSE REDUCTION: This exam was performed according to the departmental dose-optimization program which includes automated exposure control, adjustment of the mA and/or kV according to patient size and/or use of iterative reconstruction technique. COMPARISON:  CT brain 11/22/2022 FINDINGS: Brain: No acute territorial infarction, hemorrhage or intracranial mass.  Moderate white matter hypodensity consistent with chronic small vessel ischemic change. Nonenlarged ventricles. Vascular: No hyperdense vessels.  Carotid vascular calcification Skull: Normal. Negative for fracture or focal lesion. Sinuses/Orbits: No acute finding. Other: None IMPRESSION: 1. No CT evidence for acute intracranial abnormality. 2. Moderate chronic small vessel ischemic changes of the white matter. Electronically Signed   By: Jasmine Pang M.D.   On: 11/29/2022 17:56   DG Chest Port 1 View  Result Date: 11/29/2022 CLINICAL DATA:  Questionable sepsis - evaluate for abnormality EXAM: PORTABLE CHEST 1 VIEW COMPARISON:  11/22/2022. FINDINGS: Redemonstration of elevated right hemidiaphragm. Bilateral lungs exhibit coarse bronchovascular markings, which are nonspecific and differential diagnosis includes interstitial lung disease, COPD, chronic fibrosis, etc. No dense consolidation or lung mass. Previously seen bilobed nodule in the right lung lower lobe is grossly unchanged. There is apparent blunting of right lateral costophrenic angle, which may be due to superimposed soft tissues versus trace pleural effusion. Left lateral costophrenic angle is clear. Stable cardio-mediastinal silhouette. No acute osseous abnormalities. The soft tissues are within normal limits. There are metallic staples along the right lower lateral chest wall. IMPRESSION: 1. Coarse bronchovascular markings, which are nonspecific and differential diagnosis includes interstitial lung disease, COPD, chronic fibrosis, etc. 2. Possible trace right pleural effusion. Electronically Signed   By: Jules Schick M.D.   On: 11/29/2022 16:00    DISCHARGE EXAMINATION: Vitals:   12/01/22 1707 12/01/22 2050 12/02/22 0409 12/02/22 0811  BP: 122/61 130/62 128/65 137/63  Pulse: 88 85 82 82  Resp: 16 16 18 18   Temp: 98.9 F (37.2 C) 98.4 F (36.9 C) 98.2 F (36.8 C) 97.9 F (36.6 C)  TempSrc: Oral Oral Oral Oral  SpO2: 100% 100% 96%     General appearance: Awake alert.  In no distress.  Distracted. Resp: Clear to auscultation bilaterally.  Normal effort Cardio: S1-S2 is normal regular.  No S3-S4.  No rubs murmurs or bruit GI: Abdomen is soft.  Nontender nondistended.  Bowel sounds are present normal.  No masses organomegaly    DISPOSITION: SNF  Discharge Instructions     Call MD for:  difficulty breathing, headache or visual disturbances   Complete by: As directed    Call MD for:  extreme fatigue   Complete by: As directed    Call MD for:  persistant dizziness or light-headedness   Complete by: As directed    Call MD for:  persistant nausea and vomiting   Complete by: As directed    Call MD for:  severe uncontrolled pain   Complete by: As directed    Call MD for:  temperature >100.4   Complete by: As directed    Diet general   Complete by: As directed    Discharge instructions   Complete by: As directed    Please review instructions on the discharge summary.  You were cared for by a hospitalist during your hospital stay. If you have any questions about your discharge medications or the care you received while you were in the hospital after you are discharged, you can call the unit and asked to speak with the hospitalist on call if the hospitalist that took care of you is not available. Once you are discharged, your primary care physician will handle any further medical issues. Please note that NO REFILLS for any discharge medications will be authorized once you are discharged, as it is imperative that you return to your primary care physician (or establish a relationship with a primary care physician if you do not have one) for your aftercare needs so that they can reassess your need for medications and monitor your lab values. If you do not have a primary care physician, you can call [phone] for a physician referral.   Increase activity slowly   Complete by: As directed    No wound care   Complete by: As  directed          Allergies as of 12/02/2022       Reactions   Sulfa Antibiotics Rash   Cleocin [clindamycin] Other (See Comments)   Thrush   Keflex [cephalexin] Other (See Comments)   Tolerated ceftriaxone 10/2022 admission   Niacin And Related Other (See Comments)   Extreme flushing Heart racing   Septra [sulfamethoxazole-trimethoprim] Rash        Medication List     TAKE these medications    (feeding supplement) PROSource Plus liquid Take 30 mLs by mouth 2 (two) times daily between meals. What changed: when to take this   amoxicillin-clavulanate 875-125 MG tablet Commonly known as: AUGMENTIN Take 1 tablet by mouth every 12 (twelve) hours for 6 days.   aspirin 81 MG chewable tablet Commonly known as: Aspirin Childrens Chew 1 tablet (81 mg total) by mouth 2 (two) times daily with a meal.   furosemide 20 MG tablet Commonly known as: LASIX Take 20 mg by mouth in the morning.   HYDROcodone-acetaminophen 5-325 MG tablet Commonly known as: NORCO/VICODIN Take 1 tablet by mouth every 4 (four) hours as needed for up to 7 days for moderate pain or severe pain.   lactulose 10 GM/15ML solution Commonly known as: CHRONULAC Take 10 g by mouth See admin instructions. 15 mL's (10 g) twice a day every Monday, Wednesday, Friday, Sunday.   LORazepam 0.5 MG tablet Commonly known as: ATIVAN Take 1 tablet (0.5 mg total) by mouth every 8 (eight) hours as needed for anxiety (agitation, altered mental status).   melatonin 3 MG Tabs tablet Take 3 mg by mouth at bedtime.   potassium chloride 10 MEQ tablet Commonly known as: KLOR-CON Take 10 mEq by mouth in the morning.   thiamine 100 MG tablet Commonly known as: VITAMIN B1 Take 100 mg by mouth daily.  TOTAL DISCHARGE TIME: 35 minutes  Zori Benbrook [employer] on [email or web address]  12/02/2022, 10:43 AM

## 2022-12-02 NOTE — Plan of Care (Signed)
  Problem: Education: Goal: Verbalization of understanding the information provided (i.e., activity precautions, restrictions, etc) will improve Outcome: Adequate for Discharge Goal: Individualized Educational Video(s) Outcome: Adequate for Discharge   Problem: Activity: Goal: Ability to ambulate and perform ADLs will improve Outcome: Adequate for Discharge   Problem: Clinical Measurements: Goal: Postoperative complications will be avoided or minimized Outcome: Adequate for Discharge   Problem: Self-Concept: Goal: Ability to maintain and perform role responsibilities to the fullest extent possible will improve Outcome: Adequate for Discharge   Problem: Pain Management: Goal: Pain level will decrease Outcome: Adequate for Discharge   Problem: Education: Goal: Ability to describe self-care measures that may prevent or decrease complications (Diabetes Survival Skills Education) will improve Outcome: Adequate for Discharge Goal: Individualized Educational Video(s) Outcome: Adequate for Discharge   Problem: Coping: Goal: Ability to adjust to condition or change in health will improve Outcome: Adequate for Discharge   Problem: Fluid Volume: Goal: Ability to maintain a balanced intake and output will improve Outcome: Adequate for Discharge   Problem: Health Behavior/Discharge Planning: Goal: Ability to identify and utilize available resources and services will improve Outcome: Adequate for Discharge Goal: Ability to manage health-related needs will improve Outcome: Adequate for Discharge   Problem: Metabolic: Goal: Ability to maintain appropriate glucose levels will improve Outcome: Adequate for Discharge   Problem: Nutritional: Goal: Maintenance of adequate nutrition will improve Outcome: Adequate for Discharge Goal: Progress toward achieving an optimal weight will improve Outcome: Adequate for Discharge   Problem: Skin Integrity: Goal: Risk for impaired skin integrity  will decrease Outcome: Adequate for Discharge   Problem: Tissue Perfusion: Goal: Adequacy of tissue perfusion will improve Outcome: Adequate for Discharge   Problem: Education: Goal: Knowledge of General Education information will improve Description: Including pain rating scale, medication(s)/side effects and non-pharmacologic comfort measures Outcome: Adequate for Discharge   Problem: Health Behavior/Discharge Planning: Goal: Ability to manage health-related needs will improve Outcome: Adequate for Discharge   Problem: Clinical Measurements: Goal: Ability to maintain clinical measurements within normal limits will improve Outcome: Adequate for Discharge Goal: Will remain free from infection Outcome: Adequate for Discharge Goal: Diagnostic test results will improve Outcome: Adequate for Discharge Goal: Respiratory complications will improve Outcome: Adequate for Discharge Goal: Cardiovascular complication will be avoided Outcome: Adequate for Discharge   Problem: Activity: Goal: Risk for activity intolerance will decrease Outcome: Adequate for Discharge   Problem: Nutrition: Goal: Adequate nutrition will be maintained Outcome: Adequate for Discharge   Problem: Coping: Goal: Level of anxiety will decrease Outcome: Adequate for Discharge   Problem: Elimination: Goal: Will not experience complications related to bowel motility Outcome: Adequate for Discharge Goal: Will not experience complications related to urinary retention Outcome: Adequate for Discharge   Problem: Pain Managment: Goal: General experience of comfort will improve Outcome: Adequate for Discharge   Problem: Safety: Goal: Ability to remain free from injury will improve Outcome: Adequate for Discharge   Problem: Skin Integrity: Goal: Risk for impaired skin integrity will decrease Outcome: Adequate for Discharge

## 2022-12-02 NOTE — Care Management Important Message (Signed)
Important Message  Patient Details  Name: QUAMESHA MULLET MRN: [id number] Date of Birth: [birth year]   Medicare Important Message Given:  Yes     Sherilyn Banker 12/02/2022, 2:13 PM

## 2022-12-07 DIAGNOSIS — I872 Venous insufficiency (chronic) (peripheral): Secondary | ICD-10-CM | POA: Diagnosis not present

## 2022-12-07 DIAGNOSIS — Z7689 Persons encountering health services in other specified circumstances: Secondary | ICD-10-CM

## 2022-12-07 DIAGNOSIS — R2689 Other abnormalities of gait and mobility: Secondary | ICD-10-CM | POA: Diagnosis not present

## 2022-12-07 DIAGNOSIS — J449 Chronic obstructive pulmonary disease, unspecified: Secondary | ICD-10-CM | POA: Diagnosis not present

## 2022-12-07 DIAGNOSIS — E1140 Type 2 diabetes mellitus with diabetic neuropathy, unspecified: Secondary | ICD-10-CM | POA: Diagnosis not present

## 2022-12-13 ENCOUNTER — Ambulatory Visit: Payer: BLUE CROSS/BLUE SHIELD | Admitting: Neurology

## 2023-01-16 ENCOUNTER — Ambulatory Visit: Payer: Medicare Other | Admitting: Neurology

## 2023-01-17 DIAGNOSIS — Z7689 Persons encountering health services in other specified circumstances: Secondary | ICD-10-CM

## 2023-01-17 DIAGNOSIS — H53141 Visual discomfort, right eye: Secondary | ICD-10-CM | POA: Diagnosis not present

## 2023-01-17 DIAGNOSIS — E1140 Type 2 diabetes mellitus with diabetic neuropathy, unspecified: Secondary | ICD-10-CM | POA: Diagnosis not present

## 2023-01-17 DIAGNOSIS — I872 Venous insufficiency (chronic) (peripheral): Secondary | ICD-10-CM | POA: Diagnosis not present

## 2023-01-17 DIAGNOSIS — J449 Chronic obstructive pulmonary disease, unspecified: Secondary | ICD-10-CM | POA: Diagnosis not present

## 2023-01-26 ENCOUNTER — Telehealth: Payer: Self-pay | Admitting: Neurology

## 2023-01-26 NOTE — Telephone Encounter (Signed)
Thanks

## 2023-01-26 NOTE — Telephone Encounter (Signed)
Brenda Pham)  called to cancel appt due pt is now a Hospice patient.

## 2023-02-01 ENCOUNTER — Ambulatory Visit: Payer: Medicare Other | Admitting: Neurology

## 2023-07-04 ENCOUNTER — Telehealth: Payer: Self-pay | Admitting: Pulmonary Disease

## 2023-07-04 NOTE — Telephone Encounter (Signed)
 Patients daughter said patient was under hospice and was no longer doing appts with pulmonary and needs to have everything canceled. I will cancel the CT. This is just an [place]

## 2023-07-07 DIAGNOSIS — G4709 Other insomnia: Secondary | ICD-10-CM | POA: Diagnosis not present

## 2023-07-07 DIAGNOSIS — F411 Generalized anxiety disorder: Secondary | ICD-10-CM | POA: Diagnosis not present

## 2023-07-07 DIAGNOSIS — G301 Alzheimer's disease with late onset: Secondary | ICD-10-CM | POA: Diagnosis not present

## 2023-07-11 ENCOUNTER — Ambulatory Visit (HOSPITAL_COMMUNITY)

## 2023-09-04 ENCOUNTER — Ambulatory Visit: Admitting: Pulmonary Disease

## 2023-09-13 DIAGNOSIS — K5900 Constipation, unspecified: Secondary | ICD-10-CM | POA: Diagnosis not present

## 2023-09-13 DIAGNOSIS — G4700 Insomnia, unspecified: Secondary | ICD-10-CM | POA: Diagnosis not present

## 2023-11-08 DIAGNOSIS — F411 Generalized anxiety disorder: Secondary | ICD-10-CM | POA: Diagnosis not present

## 2023-11-08 DIAGNOSIS — R451 Restlessness and agitation: Secondary | ICD-10-CM | POA: Diagnosis not present

## 2023-11-08 DIAGNOSIS — K5900 Constipation, unspecified: Secondary | ICD-10-CM | POA: Diagnosis not present

## 2023-11-08 DIAGNOSIS — G4700 Insomnia, unspecified: Secondary | ICD-10-CM | POA: Diagnosis not present

## 2024-02-14 DIAGNOSIS — F331 Major depressive disorder, recurrent, moderate: Secondary | ICD-10-CM | POA: Diagnosis not present

## 2024-02-14 DIAGNOSIS — G4700 Insomnia, unspecified: Secondary | ICD-10-CM | POA: Diagnosis not present

## 2024-02-14 DIAGNOSIS — F411 Generalized anxiety disorder: Secondary | ICD-10-CM | POA: Diagnosis not present

## 2024-04-07 DIAGNOSIS — F339 Major depressive disorder, recurrent, unspecified: Secondary | ICD-10-CM

## 2024-04-07 DIAGNOSIS — R451 Restlessness and agitation: Secondary | ICD-10-CM

## 2024-04-07 DIAGNOSIS — G479 Sleep disorder, unspecified: Secondary | ICD-10-CM
